# Patient Record
Sex: Female | Born: 1945 | Race: White | Hispanic: No | Marital: Married | Smoking: Never smoker
Health system: Southern US, Community
[De-identification: ages and names within clinical notes are randomized; demographics above are authoritative.]

## PROBLEM LIST (undated history)

## (undated) DIAGNOSIS — H269 Unspecified cataract: Secondary | ICD-10-CM

## (undated) DIAGNOSIS — J45909 Unspecified asthma, uncomplicated: Secondary | ICD-10-CM

## (undated) DIAGNOSIS — K5792 Diverticulitis of intestine, part unspecified, without perforation or abscess without bleeding: Secondary | ICD-10-CM

## (undated) DIAGNOSIS — J189 Pneumonia, unspecified organism: Secondary | ICD-10-CM

## (undated) DIAGNOSIS — Z87442 Personal history of urinary calculi: Secondary | ICD-10-CM

## (undated) DIAGNOSIS — Z789 Other specified health status: Secondary | ICD-10-CM

## (undated) DIAGNOSIS — E039 Hypothyroidism, unspecified: Secondary | ICD-10-CM

## (undated) DIAGNOSIS — D649 Anemia, unspecified: Secondary | ICD-10-CM

## (undated) DIAGNOSIS — J309 Allergic rhinitis, unspecified: Secondary | ICD-10-CM

## (undated) DIAGNOSIS — R519 Headache, unspecified: Secondary | ICD-10-CM

## (undated) DIAGNOSIS — K529 Noninfective gastroenteritis and colitis, unspecified: Secondary | ICD-10-CM

## (undated) DIAGNOSIS — I471 Supraventricular tachycardia, unspecified: Secondary | ICD-10-CM

## (undated) DIAGNOSIS — E7800 Pure hypercholesterolemia, unspecified: Secondary | ICD-10-CM

## (undated) DIAGNOSIS — K219 Gastro-esophageal reflux disease without esophagitis: Secondary | ICD-10-CM

## (undated) DIAGNOSIS — I517 Cardiomegaly: Secondary | ICD-10-CM

## (undated) DIAGNOSIS — R51 Headache: Secondary | ICD-10-CM

## (undated) DIAGNOSIS — M1990 Unspecified osteoarthritis, unspecified site: Secondary | ICD-10-CM

## (undated) DIAGNOSIS — M549 Dorsalgia, unspecified: Secondary | ICD-10-CM

## (undated) DIAGNOSIS — I071 Rheumatic tricuspid insufficiency: Secondary | ICD-10-CM

## (undated) HISTORY — DX: Supraventricular tachycardia: I47.1

## (undated) HISTORY — DX: Headache: R51

## (undated) HISTORY — DX: Hypothyroidism, unspecified: E03.9

## (undated) HISTORY — DX: Other specified health status: Z78.9

## (undated) HISTORY — DX: Supraventricular tachycardia, unspecified: I47.10

## (undated) HISTORY — PX: COLONOSCOPY: SHX174

## (undated) HISTORY — PX: BACK SURGERY: SHX140

## (undated) HISTORY — DX: Allergic rhinitis, unspecified: J30.9

## (undated) HISTORY — PX: BREAST EXCISIONAL BIOPSY: SUR124

## (undated) HISTORY — DX: Diverticulitis of intestine, part unspecified, without perforation or abscess without bleeding: K57.92

## (undated) HISTORY — DX: Gastro-esophageal reflux disease without esophagitis: K21.9

## (undated) HISTORY — DX: Cardiomegaly: I51.7

## (undated) HISTORY — PX: CATARACT EXTRACTION W/ INTRAOCULAR LENS IMPLANT: SHX1309

## (undated) HISTORY — PX: EYE SURGERY: SHX253

## (undated) HISTORY — DX: Dorsalgia, unspecified: M54.9

## (undated) HISTORY — DX: Rheumatic tricuspid insufficiency: I07.1

## (undated) HISTORY — DX: Headache, unspecified: R51.9

## (undated) HISTORY — DX: Unspecified asthma, uncomplicated: J45.909

## (undated) HISTORY — DX: Pure hypercholesterolemia, unspecified: E78.00

## (undated) HISTORY — DX: Noninfective gastroenteritis and colitis, unspecified: K52.9

## (undated) HISTORY — PX: INNER EAR SURGERY: SHX679

---
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
## 1980-03-24 HISTORY — PX: BREAST BIOPSY: SHX20

## 1981-03-24 HISTORY — PX: ABDOMINAL HYSTERECTOMY: SHX81

## 1997-08-08 ENCOUNTER — Other Ambulatory Visit: Admission: RE | Admit: 1997-08-08 | Discharge: 1997-08-08 | Payer: Self-pay | Admitting: Obstetrics and Gynecology

## 1997-12-28 ENCOUNTER — Ambulatory Visit (HOSPITAL_COMMUNITY): Admission: RE | Admit: 1997-12-28 | Discharge: 1997-12-28 | Payer: Self-pay | Admitting: Family Medicine

## 1997-12-28 ENCOUNTER — Encounter: Payer: Self-pay | Admitting: Family Medicine

## 1997-12-31 ENCOUNTER — Encounter: Payer: Self-pay | Admitting: Emergency Medicine

## 1997-12-31 ENCOUNTER — Emergency Department (HOSPITAL_COMMUNITY): Admission: EM | Admit: 1997-12-31 | Discharge: 1997-12-31 | Payer: Self-pay | Admitting: Emergency Medicine

## 1998-01-05 ENCOUNTER — Encounter: Payer: Self-pay | Admitting: Pulmonary Disease

## 1998-01-05 ENCOUNTER — Ambulatory Visit (HOSPITAL_COMMUNITY): Admission: RE | Admit: 1998-01-05 | Discharge: 1998-01-05 | Payer: Self-pay | Admitting: Pulmonary Disease

## 1998-01-08 ENCOUNTER — Ambulatory Visit (HOSPITAL_COMMUNITY): Admission: RE | Admit: 1998-01-08 | Discharge: 1998-01-08 | Payer: Self-pay | Admitting: Pulmonary Disease

## 1998-01-30 ENCOUNTER — Ambulatory Visit: Admission: RE | Admit: 1998-01-30 | Discharge: 1998-01-30 | Payer: Self-pay | Admitting: Pulmonary Disease

## 1998-08-07 ENCOUNTER — Encounter: Payer: Self-pay | Admitting: Pulmonary Disease

## 1998-08-07 ENCOUNTER — Ambulatory Visit (HOSPITAL_COMMUNITY): Admission: RE | Admit: 1998-08-07 | Discharge: 1998-08-07 | Payer: Self-pay | Admitting: Pulmonary Disease

## 2000-02-05 ENCOUNTER — Other Ambulatory Visit: Admission: RE | Admit: 2000-02-05 | Discharge: 2000-02-05 | Payer: Self-pay | Admitting: Obstetrics and Gynecology

## 2001-02-24 ENCOUNTER — Encounter: Payer: Self-pay | Admitting: Obstetrics and Gynecology

## 2001-02-24 ENCOUNTER — Encounter: Admission: RE | Admit: 2001-02-24 | Discharge: 2001-02-24 | Payer: Self-pay | Admitting: Obstetrics and Gynecology

## 2002-06-08 ENCOUNTER — Other Ambulatory Visit: Admission: RE | Admit: 2002-06-08 | Discharge: 2002-06-08 | Payer: Self-pay | Admitting: Gynecology

## 2002-06-17 ENCOUNTER — Encounter: Admission: RE | Admit: 2002-06-17 | Discharge: 2002-06-17 | Payer: Self-pay | Admitting: Gynecology

## 2002-06-17 ENCOUNTER — Encounter: Payer: Self-pay | Admitting: Gynecology

## 2003-08-07 ENCOUNTER — Other Ambulatory Visit: Admission: RE | Admit: 2003-08-07 | Discharge: 2003-08-07 | Payer: Self-pay | Admitting: Gynecology

## 2003-09-19 ENCOUNTER — Encounter: Admission: RE | Admit: 2003-09-19 | Discharge: 2003-09-19 | Payer: Self-pay | Admitting: General Surgery

## 2003-11-23 ENCOUNTER — Ambulatory Visit (HOSPITAL_COMMUNITY): Admission: RE | Admit: 2003-11-23 | Discharge: 2003-11-23 | Payer: Self-pay | Admitting: Gastroenterology

## 2004-05-14 ENCOUNTER — Ambulatory Visit (HOSPITAL_COMMUNITY): Admission: RE | Admit: 2004-05-14 | Discharge: 2004-05-14 | Payer: Self-pay | Admitting: Family Medicine

## 2004-08-07 ENCOUNTER — Other Ambulatory Visit: Admission: RE | Admit: 2004-08-07 | Discharge: 2004-08-07 | Payer: Self-pay | Admitting: Gynecology

## 2004-09-02 ENCOUNTER — Encounter: Admission: RE | Admit: 2004-09-02 | Discharge: 2004-09-02 | Payer: Self-pay | Admitting: Gynecology

## 2005-09-15 ENCOUNTER — Encounter: Admission: RE | Admit: 2005-09-15 | Discharge: 2005-09-15 | Payer: Self-pay | Admitting: General Surgery

## 2005-09-18 ENCOUNTER — Other Ambulatory Visit: Admission: RE | Admit: 2005-09-18 | Discharge: 2005-09-18 | Payer: Self-pay | Admitting: Gynecology

## 2005-10-06 ENCOUNTER — Encounter: Admission: RE | Admit: 2005-10-06 | Discharge: 2005-10-06 | Payer: Self-pay | Admitting: Cardiology

## 2006-09-22 ENCOUNTER — Other Ambulatory Visit: Admission: RE | Admit: 2006-09-22 | Discharge: 2006-09-22 | Payer: Self-pay | Admitting: Gynecology

## 2006-09-29 ENCOUNTER — Encounter: Admission: RE | Admit: 2006-09-29 | Discharge: 2006-09-29 | Payer: Self-pay | Admitting: Gynecology

## 2007-11-16 ENCOUNTER — Encounter: Admission: RE | Admit: 2007-11-16 | Discharge: 2007-11-16 | Payer: Self-pay | Admitting: Gynecology

## 2007-11-19 ENCOUNTER — Encounter: Admission: RE | Admit: 2007-11-19 | Discharge: 2007-11-19 | Payer: Self-pay | Admitting: Gynecology

## 2008-07-31 ENCOUNTER — Encounter: Admission: RE | Admit: 2008-07-31 | Discharge: 2008-07-31 | Payer: Self-pay | Admitting: Family Medicine

## 2008-10-13 ENCOUNTER — Encounter: Admission: RE | Admit: 2008-10-13 | Discharge: 2008-10-13 | Payer: Self-pay | Admitting: Family Medicine

## 2009-01-02 ENCOUNTER — Encounter: Admission: RE | Admit: 2009-01-02 | Discharge: 2009-01-02 | Payer: Self-pay | Admitting: Gynecology

## 2010-01-03 ENCOUNTER — Encounter: Admission: RE | Admit: 2010-01-03 | Discharge: 2010-01-03 | Payer: Self-pay | Admitting: Gynecology

## 2010-05-28 ENCOUNTER — Encounter: Payer: Self-pay | Admitting: Cardiology

## 2010-05-28 DIAGNOSIS — M549 Dorsalgia, unspecified: Secondary | ICD-10-CM | POA: Insufficient documentation

## 2010-05-28 DIAGNOSIS — K529 Noninfective gastroenteritis and colitis, unspecified: Secondary | ICD-10-CM | POA: Insufficient documentation

## 2010-05-28 DIAGNOSIS — E039 Hypothyroidism, unspecified: Secondary | ICD-10-CM | POA: Insufficient documentation

## 2010-05-28 DIAGNOSIS — E7800 Pure hypercholesterolemia, unspecified: Secondary | ICD-10-CM | POA: Insufficient documentation

## 2010-06-24 ENCOUNTER — Encounter: Payer: Self-pay | Admitting: Cardiology

## 2010-06-24 ENCOUNTER — Ambulatory Visit (INDEPENDENT_AMBULATORY_CARE_PROVIDER_SITE_OTHER): Payer: 59 | Admitting: Cardiology

## 2010-06-24 DIAGNOSIS — E039 Hypothyroidism, unspecified: Secondary | ICD-10-CM

## 2010-06-24 DIAGNOSIS — R002 Palpitations: Secondary | ICD-10-CM

## 2010-06-24 DIAGNOSIS — E7800 Pure hypercholesterolemia, unspecified: Secondary | ICD-10-CM

## 2010-06-24 LAB — COMPREHENSIVE METABOLIC PANEL
Albumin: 3.8 g/dL (ref 3.5–5.2)
Alkaline Phosphatase: 34 U/L — ABNORMAL LOW (ref 39–117)
Chloride: 99 mEq/L (ref 96–112)
Potassium: 4.2 mEq/L (ref 3.5–5.1)
Total Bilirubin: 0.8 mg/dL (ref 0.3–1.2)

## 2010-06-24 LAB — LIPID PANEL
HDL: 94.2 mg/dL (ref >39.00)
Total CHOL/HDL Ratio: 3
VLDL: 11 mg/dL (ref 0.0–40.0)

## 2010-06-24 NOTE — Assessment & Plan Note (Signed)
The patient recently went on a Trip to [place].  She has gained a few pounds since last visit.  She is trying to watch her diet carefully.  She is intolerant to statins and Zetia but is able to take Fenofibrate.

## 2010-06-24 NOTE — Assessment & Plan Note (Signed)
No symptoms of hyper or hypothyroidism.  Her thyroid medication is followed by her primary care physician.

## 2010-06-24 NOTE — Progress Notes (Signed)
HPI: This pleasant 65 year old Caucasian female is seen for a six-month followup office visit.  She has a history of palpitations as well as a history of hypercholesterolemia and hypothyroidism.  She is also had a past history of headaches and of low back pain.  Since last visit she's been feeling well.  She has not been expressing any chest pain or shortness of breath.  She did have one episode of tachycardia since last visit.  Current Outpatient Prescriptions  Medication Sig Dispense Refill  . aspirin 81 MG tablet Take 81 mg by mouth as needed.        . Calcium Carbonate-Vitamin D (CALCIUM 600 + D PO) Take by mouth as needed.        . Cholecalciferol (VITAMIN D PO) Take by mouth daily.        . fenofibrate 160 MG tablet Take 160 mg by mouth daily. Pt. Only taking one 3 or 4 times a week      . levothyroxine (SYNTHROID, LEVOTHROID) 75 MCG tablet Take 75 mcg by mouth daily.        . metoprolol succinate (TOPROL-XL) 25 MG 24 hr tablet Take 25 mg by mouth daily. 1/2 daily          Allergies  Allergen Reactions  . Crestor (Rosuvastatin Calcium)     Gi issues  . Lipitor (Atorvastatin Calcium)     Gi symp  . Pravachol     Gi issues  . Zetia (Ezetimibe)     Gi issues  . Zocor (Simvastatin - High Dose)     Gi issues    Patient Active Problem List  Diagnoses  . Heart palpitations  . Hypercholesterolemia  . History of underactive thyroid  . Back pain  . Generalized headaches  . Colitis  . Hypothyroidism    History  Smoking status  . Never Smoker   Smokeless tobacco  . Not on file    History  Alcohol Use: Not on file    No family history on file.  Review of Systems: The patient denies any heat or cold intolerance.  No weight gain or weight loss.  The patient denies headaches or blurry vision.  There is no cough or sputum production.  The patient denies dizziness.  There is no hematuria or hematochezia.  The patient denies any muscle aches or arthritis.  The patient denies  any rash.  The patient denies frequent falling or instability.  There is no history of depression or anxiety.  All other systems were reviewed and are negative.   Physical Exam: Filed Vitals:   06/24/10 0909  BP: 110/70  Pulse: 68  Her weight is 131, up 4 pounds.  The general appearance reveals a well-developed well-nourished woman in no distress.Pupils equal and reactive.   Extraocular Movements are full.  There is no scleral icterus.  The mouth and pharynx are normal.  The neck is supple.  The carotids reveal no bruits.  The jugular venous pressure is normal.  The thyroid is not enlarged.  There is no lymphadenopathy.The chest is clear to percussion and auscultation. There are no rales or rhonchi. Expansion of the chest is symmetrical.The precordium is quiet.  The first heart sound is normal.  The second heart sound is physiologically split.  There is no murmur gallop rub or click.  There is no abnormal lift or heave.The abdomen is soft and nontender. Bowel sounds are normal. The liver and spleen are not enlarged. There Are no abdominal masses. There are  no bruits.The pedal pulses are good.  There is no phlebitis or edema.  There is no cyanosis or clubbing.Strength is normal and symmetrical in all extremities.  There is no lateralizing weakness.  There are no sensory deficits.The skin is warm and dry.  There is no rash.    Assessment / Plan: Watch diet carefully and try to lose weight.  Recheck in 6 months.  Continue same medication

## 2010-06-24 NOTE — Assessment & Plan Note (Signed)
Since last visit she had one episode of tachycardia which lasted about 45 minutes and responded to [place] extra half tablet of Toprol.

## 2010-06-24 NOTE — Progress Notes (Signed)
Addended by: Carman Ching on: 06/24/2010 12:00 PM   Modules accepted: Orders

## 2010-06-26 ENCOUNTER — Telehealth: Payer: Self-pay | Admitting: *Deleted

## 2010-06-27 NOTE — Telephone Encounter (Signed)
Error, opened by mistake

## 2010-07-26 ENCOUNTER — Other Ambulatory Visit: Payer: Self-pay | Admitting: Cardiology

## 2010-07-26 NOTE — Telephone Encounter (Signed)
Medication Refill

## 2010-08-05 ENCOUNTER — Encounter: Payer: Self-pay | Admitting: Cardiology

## 2010-11-21 ENCOUNTER — Other Ambulatory Visit: Payer: Self-pay | Admitting: Cardiology

## 2010-11-21 DIAGNOSIS — E781 Pure hyperglyceridemia: Secondary | ICD-10-CM

## 2010-12-03 ENCOUNTER — Other Ambulatory Visit: Payer: Self-pay | Admitting: Gynecology

## 2010-12-03 DIAGNOSIS — Z1231 Encounter for screening mammogram for malignant neoplasm of breast: Secondary | ICD-10-CM

## 2011-01-09 ENCOUNTER — Ambulatory Visit
Admission: RE | Admit: 2011-01-09 | Discharge: 2011-01-09 | Disposition: A | Discharge status: Home / Self Care | Payer: 59 | Source: Ambulatory Visit | Attending: Gynecology | Admitting: Gynecology

## 2011-01-09 DIAGNOSIS — Z1231 Encounter for screening mammogram for malignant neoplasm of breast: Secondary | ICD-10-CM

## 2011-01-27 ENCOUNTER — Ambulatory Visit (INDEPENDENT_AMBULATORY_CARE_PROVIDER_SITE_OTHER): Payer: 59 | Admitting: *Deleted

## 2011-01-27 DIAGNOSIS — Z862 Personal history of diseases of the blood and blood-forming organs and certain disorders involving the immune mechanism: Secondary | ICD-10-CM

## 2011-01-27 DIAGNOSIS — Z8639 Personal history of other endocrine, nutritional and metabolic disease: Secondary | ICD-10-CM

## 2011-01-27 DIAGNOSIS — E7800 Pure hypercholesterolemia, unspecified: Secondary | ICD-10-CM

## 2011-01-27 DIAGNOSIS — R002 Palpitations: Secondary | ICD-10-CM

## 2011-01-27 LAB — LIPID PANEL
Cholesterol: 196 mg/dL (ref 0–200)
HDL: 97.1 mg/dL (ref >39.00)
Triglycerides: 57 mg/dL (ref 0.0–149.0)
VLDL: 11.4 mg/dL (ref 0.0–40.0)

## 2011-01-27 LAB — HEPATIC FUNCTION PANEL
ALT: 16 U/L (ref 0–35)
Albumin: 4.1 g/dL (ref 3.5–5.2)
Total Protein: 7.2 g/dL (ref 6.0–8.3)

## 2011-01-27 LAB — BASIC METABOLIC PANEL
BUN: 18 mg/dL (ref 6–23)
Chloride: 103 mEq/L (ref 96–112)
GFR: 81.1 mL/min (ref >60.00)
Potassium: 3.9 mEq/L (ref 3.5–5.1)
Sodium: 140 mEq/L (ref 135–145)

## 2011-01-29 ENCOUNTER — Encounter: Payer: Self-pay | Admitting: Cardiology

## 2011-01-29 ENCOUNTER — Ambulatory Visit (INDEPENDENT_AMBULATORY_CARE_PROVIDER_SITE_OTHER): Payer: Medicare Other | Admitting: Cardiology

## 2011-01-29 VITALS — BP 116/78 | HR 80 | Ht 62.0 in | Wt 133.0 lb

## 2011-01-29 DIAGNOSIS — E7800 Pure hypercholesterolemia, unspecified: Secondary | ICD-10-CM

## 2011-01-29 DIAGNOSIS — E039 Hypothyroidism, unspecified: Secondary | ICD-10-CM

## 2011-01-29 DIAGNOSIS — R002 Palpitations: Secondary | ICD-10-CM

## 2011-01-29 NOTE — Assessment & Plan Note (Signed)
The patient has had a past history of palpitations.  Recently she's had no chest pain or palpitations.  She has been getting more regular exercise and is using an exercise bicycle inside and also has a bicycle that she uses outside with her grandchildren in the warmer weather

## 2011-01-29 NOTE — Patient Instructions (Signed)
Your physician recommends that you continue on your current medications as directed. Please refer to the Current Medication list given to you today. Your physician wants you to follow-up in: 6 months with fasting labs (LP/HFP/BMET)  You will receive a reminder letter in the mail two months in advance. If you don't receive a letter, please call our office to schedule the follow-up appointment.

## 2011-01-29 NOTE — Assessment & Plan Note (Signed)
Patient has a past history of hypercholesterolemia.  She had recent lab work which shows excellent numbers.  He is intolerant of statins but is presently on fenofibrate which she is tolerating

## 2011-01-29 NOTE — Progress Notes (Signed)
Elizabeth Mills Date of Birth:  11-25-1945 [facility] / [facility] [address]   [address] [place], [place]  [address] [phone]           Fax   [phone]  HPI: This pleasant 65 year old woman is seen for a scheduled 6 month followup office visit.  She has a past history of hypercholesterolemia and hypothyroidism as well as palpitations.  He has had a tendency toward low blood pressure in the past.  He does not have any history of ischemic heart disease.  She had a normal nuclear stress test in 2007.  She has a past history of intermittent edema and had an echocardiogram in December 2008 showing impaired relaxation normal LV systolic function with an ejection fraction of 60-65%.  There was mild to moderate LVH.  There was mild her valve sclerosis without stenosis and she had normal pulmonary artery pressure.  Current Outpatient Prescriptions  Medication Sig Dispense Refill  . albuterol (PROVENTIL HFA;VENTOLIN HFA) 108 (90 BASE) MCG/ACT inhaler Inhale 2 puffs into the lungs every 6 (six) hours as needed.        Marland Kitchen aspirin 81 MG tablet Take 81 mg by mouth as needed.        . Calcium Carbonate-Vitamin D (CALCIUM 600 + D PO) Take by mouth as needed.        . fenofibrate 160 MG tablet TAKE 1 TABLET DAILY  90 tablet  3  . Fluticasone-Salmeterol (ADVAIR DISKUS) 250-50 MCG/DOSE AEPB Inhale 1 puff into the lungs every 12 (twelve) hours.        Marland Kitchen levothyroxine (SYNTHROID, LEVOTHROID) 75 MCG tablet Take 75 mcg by mouth daily.        . metoprolol succinate (TOPROL-XL) 25 MG 24 hr tablet TAKE ONE-HALF (1/2) TABLET DAILY OR AS DIRECTED  90 tablet  11    Allergies  Allergen Reactions  . Crestor (Rosuvastatin Calcium)     Gi issues  . Lipitor (Atorvastatin Calcium)     Gi symp  . Phenergan   . Pravachol     Gi issues  . Sulfa Antibiotics   . Zetia (Ezetimibe)     Gi issues  . Zocor (Simvastatin - High Dose)     Gi issues    Patient Active Problem List  Diagnoses  .  Heart palpitations  . Hypercholesterolemia  . History of underactive thyroid  . Back pain  . Generalized headaches  . Colitis  . Hypothyroidism    History  Smoking status  . Never Smoker   Smokeless tobacco  . Not on file    History  Alcohol Use: Not on file    No family history on file.  Review of Systems: The patient denies any heat or cold intolerance.  No weight gain or weight loss.  The patient denies headaches or blurry vision.  There is no cough or sputum production.  The patient denies dizziness.  There is no hematuria or hematochezia.  The patient denies any muscle aches or arthritis.  The patient denies any rash.  The patient denies frequent falling or instability.  There is no history of depression or anxiety.  All other systems were reviewed and are negative.   Physical Exam: Filed Vitals:   01/29/11 1334  BP: 116/78  Pulse: 80   Gen. appearance reveals a well-developed well-nourished woman in no distress.The head and neck exam reveals pupils equal and reactive.  Extraocular movements are full.  There is no scleral icterus.  The mouth  and pharynx are normal.  The neck is supple.  The carotids reveal no bruits.  The jugular venous pressure is normal.  The  thyroid is not enlarged.  There is no lymphadenopathy.  The chest is clear to percussion and auscultation.  There are no rales or rhonchi.  Expansion of the chest is symmetrical.  The precordium is quiet.  The first heart sound is normal.  The second heart sound is physiologically split.  There is no murmur gallop rub or click.  There is no abnormal lift or heave.  The abdomen is soft and nontender.  The bowel sounds are normal.  The liver and spleen are not enlarged.  There are no abdominal masses.  There are no abdominal bruits.  Extremities reveal good pedal pulses.  There is no phlebitis or edema.  There is no cyanosis or clubbing.  Strength is normal and symmetrical in all extremities.  There is no lateralizing  weakness.  There are no sensory deficits.  The skin is warm and dry.  There is no rash.     Assessment / Plan: Continue regular exercise.  Continue same medication.  Recheck in 6 months for followup office visit and fasting lab work

## 2011-07-22 ENCOUNTER — Other Ambulatory Visit (INDEPENDENT_AMBULATORY_CARE_PROVIDER_SITE_OTHER): Payer: Medicare Other

## 2011-07-22 DIAGNOSIS — E7800 Pure hypercholesterolemia, unspecified: Secondary | ICD-10-CM

## 2011-07-22 LAB — BASIC METABOLIC PANEL
Chloride: 104 mEq/L (ref 96–112)
GFR: 83.51 mL/min (ref >60.00)
Potassium: 3.8 mEq/L (ref 3.5–5.1)
Sodium: 140 mEq/L (ref 135–145)

## 2011-07-22 LAB — LIPID PANEL
HDL: 95.3 mg/dL (ref >39.00)
Triglycerides: 60 mg/dL (ref 0.0–149.0)
VLDL: 12 mg/dL (ref 0.0–40.0)

## 2011-07-22 LAB — HEPATIC FUNCTION PANEL
ALT: 16 U/L (ref 0–35)
Total Protein: 7.2 g/dL (ref 6.0–8.3)

## 2011-07-23 LAB — LDL CHOLESTEROL, DIRECT: Direct LDL: 103.2 mg/dL

## 2011-07-23 NOTE — Progress Notes (Signed)
Quick Note:  Please make copy of labs for patient visit. ______ 

## 2011-07-29 ENCOUNTER — Ambulatory Visit (INDEPENDENT_AMBULATORY_CARE_PROVIDER_SITE_OTHER): Payer: Medicare Other | Admitting: Cardiology

## 2011-07-29 ENCOUNTER — Encounter: Payer: Self-pay | Admitting: Cardiology

## 2011-07-29 VITALS — BP 101/63 | HR 70 | Ht 62.0 in | Wt 134.0 lb

## 2011-07-29 DIAGNOSIS — E7800 Pure hypercholesterolemia, unspecified: Secondary | ICD-10-CM

## 2011-07-29 DIAGNOSIS — R002 Palpitations: Secondary | ICD-10-CM

## 2011-07-29 DIAGNOSIS — Z8639 Personal history of other endocrine, nutritional and metabolic disease: Secondary | ICD-10-CM

## 2011-07-29 DIAGNOSIS — J40 Bronchitis, not specified as acute or chronic: Secondary | ICD-10-CM

## 2011-07-29 DIAGNOSIS — Z862 Personal history of diseases of the blood and blood-forming organs and certain disorders involving the immune mechanism: Secondary | ICD-10-CM

## 2011-07-29 MED ORDER — AMOXICILLIN 500 MG PO CAPS: 500.0000 mg | ORAL_CAPSULE | Freq: Three times a day (TID) | ORAL | Status: AC

## 2011-07-29 NOTE — Assessment & Plan Note (Signed)
The patient has a past history of hypercholesterolemia.  She is intolerant of statins.  He is presently on fenofibrate.  We reviewed her lab work which is satisfactory.

## 2011-07-29 NOTE — Patient Instructions (Signed)
Rx for Amoxil three times a day has been sent to [facility] Start plain Mucinex twice a day If no better or worse call back or go Urgent Care/Emergency Department  Continue all other medications  Your physician wants you to follow-up in: 1 year You will receive a reminder letter in the mail two months in advance. If you don't receive a letter, please call our office to schedule the follow-up appointment.

## 2011-07-29 NOTE — Assessment & Plan Note (Signed)
The patient has a past history of hypothyroidism and is on generic Synthroid 75 mcg daily.  She is clinically euthyroid

## 2011-07-29 NOTE — Assessment & Plan Note (Signed)
The patient has been experiencing a deep chest cough with green and yellow sputum.  She has already been taking some Mucinex.  We will add amoxicillin 500 mg 3 times a day.  She states that Zithromax usually does not help her.

## 2011-07-29 NOTE — Assessment & Plan Note (Signed)
The patient has not been experiencing any palpitations recently 

## 2011-07-29 NOTE — Progress Notes (Signed)
Elizabeth Mills Date of Birth:  03-10-1946 [facility] [address] [place], [place]  [address] [phone]         Fax   [phone]  History of Present Illness: This pleasant 66 year old woman is seen for a month followup office visit.  She has a history of hypercholesterolemia and a history of hypothyroidism.  She's had a past history of palpitations.  She does not have any history of ischemic heart disease and she had normal nuclear stress test in 2007.  She had an echocardiogram in December 2008 which showed an ejection fraction of 60-65% with impaired relaxation and normal LV systolic function.  Since last visit she's been doing well except for a nagging cough which has been present for several months but exacerbated recently by an upper respiratory infection.  Current Outpatient Prescriptions  Medication Sig Dispense Refill  . albuterol (PROVENTIL HFA;VENTOLIN HFA) 108 (90 BASE) MCG/ACT inhaler Inhale 2 puffs into the lungs every 6 (six) hours as needed.        Marland Kitchen aspirin 81 MG tablet Take 81 mg by mouth as needed.        . fenofibrate 160 MG tablet TAKE 1 TABLET DAILY  90 tablet  3  . Fluticasone-Salmeterol (ADVAIR DISKUS) 250-50 MCG/DOSE AEPB Inhale 1 puff into the lungs every 12 (twelve) hours.        Marland Kitchen levothyroxine (SYNTHROID, LEVOTHROID) 75 MCG tablet Take 75 mcg by mouth daily.        . metoprolol succinate (TOPROL-XL) 25 MG 24 hr tablet TAKE ONE-HALF (1/2) TABLET DAILY OR AS DIRECTED  90 tablet  11  . amoxicillin (AMOXIL) 500 MG capsule Take 1 capsule (500 mg total) by mouth 3 (three) times daily.  20 capsule  0    Allergies  Allergen Reactions  . Crestor (Rosuvastatin Calcium)     Gi issues  . Lipitor (Atorvastatin Calcium)     Gi symp  . Phenergan (Promethazine Hcl)   . Pravachol     Gi issues  . Promethazine Hcl   . Sulfa Antibiotics   . Zetia (Ezetimibe)     Gi issues  . Zocor (Simvastatin - High Dose)     Gi issues    Patient Active  Problem List  Diagnoses  . Heart palpitations  . Hypercholesterolemia  . History of underactive thyroid  . Back pain  . Generalized headaches  . Colitis  . Hypothyroidism  . Bronchitis    History  Smoking status  . Never Smoker   Smokeless tobacco  . Not on file    History  Alcohol Use: Not on file    No family history on file.  Review of Systems: Constitutional: no fever chills diaphoresis or fatigue or change in weight.  Head and neck: no hearing loss, no epistaxis, no photophobia or visual disturbance. Respiratory: No cough, shortness of breath or wheezing. Cardiovascular: No chest pain peripheral edema, palpitations. Gastrointestinal: No abdominal distention, no abdominal pain, no change in bowel habits hematochezia or melena. Genitourinary: No dysuria, no frequency, no urgency, no nocturia. Musculoskeletal:No arthralgias, no back pain, no gait disturbance or myalgias. Neurological: No dizziness, no headaches, no numbness, no seizures, no syncope, no weakness, no tremors. Hematologic: No lymphadenopathy, no easy bruising. Psychiatric: No confusion, no hallucinations, no sleep disturbance.    Physical Exam: Filed Vitals:   07/29/11 0944  BP: 101/63  Pulse: 70   the general appearance reveals a well-developed well-nourished woman in no distress.The head and neck exam  reveals pupils equal and reactive.  Extraocular movements are full.  There is no scleral icterus.  She does have some sinus tenderness in the maxillary and frontal sinuses.  The mouth and pharynx are normal.  The neck is supple.  The carotids reveal no bruits.  The jugular venous pressure is normal.  The  thyroid is not enlarged.  There is no lymphadenopathy.  The chest is clear to percussion and auscultation.  There are no rales or rhonchi.  Expansion of the chest is symmetrical.  The precordium is quiet.  The first heart sound is normal.  The second heart sound is physiologically split.  There is no murmur  gallop rub or click.  There is no abnormal lift or heave.  The abdomen is soft and nontender.  The bowel sounds are normal.  The liver and spleen are not enlarged.  There are no abdominal masses.  There are no abdominal bruits.  Extremities reveal good pedal pulses.  There is no phlebitis or edema.  There is no cyanosis or clubbing.  Strength is normal and symmetrical in all extremities.  There is no lateralizing weakness.  There are no sensory deficits.  The skin is warm and dry.  There is no rash.  EKG today shows normal sinus rhythm and no ischemic changes and is essentially within normal limits.  Assessment / Plan:  Continue same medication.  She will see Dr. Felicity Coyer for a new patient visit in September.  We will plan to see the patient back in one year for followup office visit EKG and fasting lipid panel hepatic function panel and basal metabolic panel. We will treat her bronchitis and sinusitis with amoxicillin today.

## 2011-10-24 ENCOUNTER — Other Ambulatory Visit: Payer: Self-pay | Admitting: Cardiology

## 2011-12-23 ENCOUNTER — Encounter: Payer: Self-pay | Admitting: Internal Medicine

## 2011-12-23 ENCOUNTER — Ambulatory Visit (INDEPENDENT_AMBULATORY_CARE_PROVIDER_SITE_OTHER): Payer: 59 | Admitting: Internal Medicine

## 2011-12-23 VITALS — BP 112/68 | HR 78 | Temp 97.6°F | Ht 62.5 in | Wt 134.0 lb

## 2011-12-23 DIAGNOSIS — Z23 Encounter for immunization: Secondary | ICD-10-CM

## 2011-12-23 DIAGNOSIS — J309 Allergic rhinitis, unspecified: Secondary | ICD-10-CM

## 2011-12-23 DIAGNOSIS — J45909 Unspecified asthma, uncomplicated: Secondary | ICD-10-CM | POA: Insufficient documentation

## 2011-12-23 DIAGNOSIS — E781 Pure hyperglyceridemia: Secondary | ICD-10-CM

## 2011-12-23 DIAGNOSIS — E039 Hypothyroidism, unspecified: Secondary | ICD-10-CM

## 2011-12-23 MED ORDER — CYCLOBENZAPRINE HCL 5 MG PO TABS: 5.0000 mg | ORAL_TABLET | Freq: Two times a day (BID) | ORAL | Status: DC | PRN

## 2011-12-23 MED ORDER — LORATADINE 10 MG PO TABS: 10.0000 mg | ORAL_TABLET | Freq: Every day | ORAL | Status: DC

## 2011-12-23 MED ORDER — FENOFIBRATE 160 MG PO TABS: 160.0000 mg | ORAL_TABLET | Freq: Every day | ORAL | Status: DC

## 2011-12-23 MED ORDER — ALBUTEROL SULFATE HFA 108 (90 BASE) MCG/ACT IN AERS: 2.0000 | INHALATION_SPRAY | RESPIRATORY_TRACT | Status: DC | PRN

## 2011-12-23 MED ORDER — METOPROLOL SUCCINATE ER 25 MG PO TB24: 12.5000 mg | ORAL_TABLET | Freq: Every day | ORAL | Status: DC

## 2011-12-23 NOTE — Assessment & Plan Note (Signed)
Continue over-the-counter Claritin once daily for next 30 days Suspect contributing to postnasal drip symptoms and throat clearing

## 2011-12-23 NOTE — Assessment & Plan Note (Signed)
Patient intolerant of prior statin trials Tolerating fenofibrate without complication Continue same with attention to exercise and diet as ongoing

## 2011-12-23 NOTE — Assessment & Plan Note (Signed)
No active symptoms or recent flares. Continue Advair with rescue inhaler as needed Send for prior records

## 2011-12-23 NOTE — Progress Notes (Signed)
Subjective:    Patient ID: Elizabeth Mills, female    DOB: 1945-12-19, 66 y.o.   MRN: [id number]  HPI  New patient to me and my division, known to cardiology -here to establish care Reviewed chronic medical issues today  Hypothyroid. the patient reports compliance with medication(s) as prescribed. Denies adverse side effects.  Dyslipidemia -intolerant of statin medications. Takes fenofibrate for same and follows with cardiology. Follows low-fat diet and regular exercise  Low back pain chronic with history of prior surgical intervention. Also upper back and neck spasms. No radiation into arms or hand. Associated with headache symptoms if spasms are severe. Episodic flares one or 2 times monthly. No regular use of muscle relaxants or pain medication  Past Medical History  Diagnosis Date  . Hypercholesterolemia   . Back pain   . Generalized headaches   . Colitis   . Hypothyroidism   . Asthma   . Diverticulitis   . Allergic rhinitis, cause unspecified   . GERD (gastroesophageal reflux disease)    Family History  Problem Relation Age of Onset  . Cancer Mother     Kidney and Ovarian Cancer  . Arthritis Mother   . Hyperlipidemia Mother   . Arthritis Father   . Hyperlipidemia Father    History  Substance Use Topics  . Smoking status: Never Smoker   . Smokeless tobacco: Never Used   Comment: married, lives with spouse - retired  . Alcohol Use: No    Review of Systems Constitutional: Negative for fever or unexpected weight change.  Respiratory: Negative for cough and shortness of breath.   complains of frequent throat clearing Cardiovascular: Negative for chest pain or palpitations.  Gastrointestinal: Negative for abdominal pain, no bowel changes.  reports difficulty swallowing bread, but no regurgitation Musculoskeletal: Negative for gait problem or joint swelling.  Skin: Negative for rash.  Neurological: Negative for dizziness or headache.  No other specific complaints in  a complete review of systems (except as listed in HPI above).     Objective:   Physical Exam BP 112/68  Pulse 78  Temp 97.6 F (36.4 C) (Oral)  Ht 5' 2.5" (1.588 m)  Wt 134 lb (60.782 kg)  BMI 24.12 kg/m2  SpO2 97% Wt Readings from Last 3 Encounters:  12/23/11 134 lb (60.782 kg)  07/29/11 134 lb (60.782 kg)  01/29/11 133 lb (60.328 kg)   Constitutional: She appears well-developed and well-nourished. No distress.  HENT: Head: Normocephalic and atraumatic. Ears: B TMs ok, no erythema or effusion; Nose: Nose normal. Mouth/Throat: Oropharynx is clear and moist. No oropharyngeal exudate.  thick postnasal drainage evident Eyes: Conjunctivae and EOM are normal. Pupils are equal, round, and reactive to light. No scleral icterus.  Neck: Normal range of motion. Neck supple. No JVD or LAD present. No carotid bruits. No thyromegaly present.  Cardiovascular: Normal rate, regular rhythm and normal heart sounds.  No murmur heard. No BLE edema. Pulmonary/Chest: Effort normal and breath sounds normal. No respiratory distress. She has no wheezes.  Abdominal: Soft. Bowel sounds are normal. She exhibits no distension. There is no tenderness. no masses Musculoskeletal: Normal range of motion, no joint effusions. No gross deformities Neurological: She is alert and oriented to person, place, and time. No cranial nerve deficit. Coordination normal.  Skin: Skin is warm and dry. No rash noted. No erythema.  Psychiatric: She has a normal mood and affect. Her behavior is normal. Judgment and thought content normal.   Lab Results  Component Value Date  GLUCOSE 88 07/22/2011   CHOL 207* 07/22/2011   TRIG 60.0 07/22/2011   HDL 95.30 07/22/2011   LDLDIRECT 103.2 07/22/2011   LDLCALC 88 01/27/2011   ALT 16 07/22/2011   AST 21 07/22/2011   NA 140 07/22/2011   K 3.8 07/22/2011   CL 104 07/22/2011   CREATININE 0.7 07/22/2011   BUN 16 07/22/2011   CO2 29 07/22/2011        Assessment & Plan:  See problem list.  Medications and labs reviewed today.  Postnasal drip with frequent throat clearing. Allergic sinusitis by history and exam. Also possible uncontrolled reflux. Recommended continuation of Nexium daily, not as needed for the next 30 days with daily antihistamine such as Claritin for the next 30 days. If continued problems, patient will call for referral to GI as needed to evaluate for possible esophageal problem  Time spent with pt today 45 minutes, greater than 50% time spent counseling patient on hypothyroid symptoms, lipid management, palpitations and postnasal drip symptoms. Also medication review and need to review prior records -release of information requested today

## 2011-12-23 NOTE — Assessment & Plan Note (Signed)
Will send for records from prior PCP Continue current dose thyroid, check TSH annually and adjust as needed No results found for this basename: TSH

## 2011-12-23 NOTE — Addendum Note (Signed)
Addended by: Carin Primrose on: 12/23/2011 04:49 PM   Modules accepted: Orders

## 2011-12-23 NOTE — Patient Instructions (Addendum)
It was good to see you today. We have reviewed your prior records including labs and tests today Medications reviewed and updated, Refill on medication(s) as discussed today. Take Nexium every day for the next 30 days in addition to Claritin everyday for the next 30 days -let us know if postnasal drip and throat clearing does not improve for referral as needed Try flexeril low dose at bedtime as needed for shoulder and neck spasm pain - Your prescription(s) have been submitted to your local pharmacy. Please take as directed and contact our office if you believe you are having problem(s) with the medication(s). Will send to your prior PCP for copy of past records Flu shot given today Please schedule followup in 6 months, call sooner if problems.

## 2012-01-29 ENCOUNTER — Other Ambulatory Visit: Payer: Self-pay | Admitting: Gynecology

## 2012-01-29 DIAGNOSIS — Z1231 Encounter for screening mammogram for malignant neoplasm of breast: Secondary | ICD-10-CM

## 2012-02-04 DIAGNOSIS — IMO0002 Reserved for concepts with insufficient information to code with codable children: Secondary | ICD-10-CM | POA: Insufficient documentation

## 2012-02-04 DIAGNOSIS — H53009 Unspecified amblyopia, unspecified eye: Secondary | ICD-10-CM | POA: Insufficient documentation

## 2012-03-01 ENCOUNTER — Other Ambulatory Visit: Payer: Self-pay | Admitting: *Deleted

## 2012-03-01 MED ORDER — FLUTICASONE-SALMETEROL 250-50 MCG/DOSE IN AEPB: 1.0000 | INHALATION_SPRAY | Freq: Two times a day (BID) | RESPIRATORY_TRACT | Status: DC

## 2012-03-01 MED ORDER — LEVOTHYROXINE SODIUM 75 MCG PO TABS: 75.0000 ug | ORAL_TABLET | Freq: Every day | ORAL | Status: DC

## 2012-03-01 MED ORDER — FLUTICASONE PROPIONATE 50 MCG/ACT NA SUSP: 2.0000 | Freq: Every day | NASAL | Status: DC

## 2012-03-01 NOTE — Telephone Encounter (Signed)
Left msg on vm needing [facility] flon[facility], levothyroxine, and advair. Called pt back no answer LMOM sent to mail service...[name]

## 2012-03-10 ENCOUNTER — Ambulatory Visit
Admission: RE | Admit: 2012-03-10 | Discharge: 2012-03-10 | Disposition: A | Discharge status: Home / Self Care | Payer: 59 | Source: Ambulatory Visit | Attending: Gynecology | Admitting: Gynecology

## 2012-03-10 DIAGNOSIS — Z1231 Encounter for screening mammogram for malignant neoplasm of breast: Secondary | ICD-10-CM

## 2012-03-31 ENCOUNTER — Ambulatory Visit (INDEPENDENT_AMBULATORY_CARE_PROVIDER_SITE_OTHER): Payer: 59 | Admitting: Internal Medicine

## 2012-03-31 ENCOUNTER — Encounter: Payer: Self-pay | Admitting: Internal Medicine

## 2012-03-31 ENCOUNTER — Ambulatory Visit (INDEPENDENT_AMBULATORY_CARE_PROVIDER_SITE_OTHER)
Admission: RE | Admit: 2012-03-31 | Discharge: 2012-03-31 | Disposition: A | Discharge status: Home / Self Care | Payer: 59 | Source: Ambulatory Visit | Attending: Internal Medicine | Admitting: Internal Medicine

## 2012-03-31 ENCOUNTER — Other Ambulatory Visit (INDEPENDENT_AMBULATORY_CARE_PROVIDER_SITE_OTHER): Payer: 59

## 2012-03-31 VITALS — BP 100/64 | HR 78 | Temp 98.8°F | Wt 135.8 lb

## 2012-03-31 DIAGNOSIS — M7541 Impingement syndrome of right shoulder: Secondary | ICD-10-CM

## 2012-03-31 DIAGNOSIS — E039 Hypothyroidism, unspecified: Secondary | ICD-10-CM

## 2012-03-31 MED ORDER — DICLOFENAC SODIUM 75 MG PO TBEC: 75.0000 mg | DELAYED_RELEASE_TABLET | Freq: Two times a day (BID) | ORAL | Status: DC

## 2012-03-31 NOTE — Assessment & Plan Note (Signed)
?  systemic symptoms related to undertx check TSH and adjust as needed No results found for this basename: TSH

## 2012-03-31 NOTE — Progress Notes (Signed)
  Subjective:    Patient ID: Elizabeth Mills, female    DOB: 1945-10-02, 67 y.o.   MRN: [id number]  HPI  complains of right shoulder pain Onset 2 weeks ago No radiation of pain into forearm, neck acknowledges overuse - moving furniture, but denies specific precipitating injury Hx L frozen shoulder similar to current symptoms - resolved with PT Not associated with weakness, no numbness symptoms not improved with prn OTC ibuprofen or Aleve  Also concerned ?thyroid off balance - bowel changes, dry skin, weight gain and hair loss  Past Medical History  Diagnosis Date  . Hypercholesterolemia   . Back pain   . Generalized headaches   . Colitis   . Hypothyroidism   . Asthma   . Diverticulitis   . Allergic rhinitis, cause unspecified   . GERD (gastroesophageal reflux disease)     Review of Systems  Constitutional: Negative for fever and fatigue.  HENT: Negative for neck pain and neck stiffness.   Neurological: Negative for weakness and headaches.       Objective:   Physical Exam BP 100/64  Pulse 78  Temp 98.8 F (37.1 C) (Oral)  Wt 135 lb 12.8 oz (61.598 kg)  SpO2 96% Wt Readings from Last 3 Encounters:  03/31/12 135 lb 12.8 oz (61.598 kg)  12/23/11 134 lb (60.782 kg)  07/29/11 134 lb (60.782 kg)   Constitutional: She appears well-developed and well-nourished. No distress.  Neck: Normal range of motion. Neck supple. No JVD present. No thyromegaly present.  Cardiovascular: Normal rate, regular rhythm and normal heart sounds.  No murmur heard. No BLE edema. Pulmonary/Chest: Effort normal and breath sounds normal. No respiratory distress. She has no wheezes.  Musculoskeletal: R Shoulder: Full range of motion. Neurovascularly intact distally. Good strength with stress of rotator cuff but causes pain. Positive impingement signs. Psychiatric: She has a normal mood and affect. Her behavior is normal. Judgment and thought content normal.   Lab Results  Component Value Date   GLUCOSE 88 07/22/2011   CHOL 207* 07/22/2011   TRIG 60.0 07/22/2011   HDL 95.30 07/22/2011   LDLDIRECT 103.2 07/22/2011   LDLCALC 88 01/27/2011   ALT 16 07/22/2011   AST 21 07/22/2011   NA 140 07/22/2011   K 3.8 07/22/2011   CL 104 07/22/2011   CREATININE 0.7 07/22/2011   BUN 16 07/22/2011   CO2 29 07/22/2011        Assessment & Plan:   R shoulder impingement - good strength on exam and no precipitating trauma so doubt tear declines steroid injection today - prior L shoulder symptoms resolved with PT Start oral NSAID course and refer to PT now Also check xray rule out DJD/spur To call if worse or unimproved on conservative care for ortho refer or imaging as needed  Also See problem list. Medications and labs reviewed today.

## 2012-03-31 NOTE — Patient Instructions (Signed)
It was good to see you today. Test(s) ordered today. Your results will be released to MyChart (or called to you) after review, usually within 72hours after test completion. If any changes need to be made, you will be notified at that same time. Take Voltaren 75mg  2x.day with food for 1-2 weeks, then Aleve as needed - do not take Aleve or ibuprofen while on prescription Voltaren Your prescription(s) have been submitted to your pharmacy. Please take as directed and contact our office if you believe you are having problem(s) with the medication(s). we'll make referral to physical therapy . Our office will contact you regarding appointment(s) once made. Impingement Syndrome, Rotator Cuff, Bursitis with Rehab Impingement syndrome is a condition that involves inflammation of the tendons of the rotator cuff and the subacromial bursa, that causes pain in the shoulder. The rotator cuff consists of four tendons and muscles that control much of the shoulder and upper arm function. The subacromial bursa is a fluid filled sac that helps reduce friction between the rotator cuff and one of the bones of the shoulder (acromion). Impingement syndrome is usually an overuse injury that causes swelling of the bursa (bursitis), swelling of the tendon (tendonitis), and/or a tear of the tendon (strain). Strains are classified into three categories. Grade 1 strains cause pain, but the tendon is not lengthened. Grade 2 strains include a lengthened ligament, due to the ligament being stretched or partially ruptured. With grade 2 strains there is still function, although the function may be decreased. Grade 3 strains include a complete tear of the tendon or muscle, and function is usually impaired. SYMPTOMS    Pain around the shoulder, often at the outer portion of the upper arm.   Pain that gets worse with shoulder function, especially when reaching overhead or lifting.   Sometimes, aching when not using the arm.   Pain that  wakes you up at night.   Sometimes, tenderness, swelling, warmth, or redness over the affected area.   Loss of strength.   Limited motion of the shoulder, especially reaching behind the back (to the back pocket or to unhook bra) or across your body.   Crackling sound (crepitation) when moving the arm.   Biceps tendon pain and inflammation (in the front of the shoulder). Worse when bending the elbow or lifting.  CAUSES   Impingement syndrome is often an overuse injury, in which chronic (repetitive) motions cause the tendons or bursa to become inflamed. A strain occurs when a force is paced on the tendon or muscle that is greater than it can withstand. Common mechanisms of injury include: Stress from sudden increase in duration, frequency, or intensity of training.  Direct hit (trauma) to the shoulder.   Aging, erosion of the tendon with normal use.   Bony bump on shoulder (acromial spur).  RISK INCREASES WITH:  Contact sports (football, wrestling, boxing).   Throwing sports (baseball, tennis, volleyball).   Weightlifting and bodybuilding.   Heavy labor.   Previous injury to the rotator cuff, including impingement.   Poor shoulder strength and flexibility.   Failure to warm up properly before activity.   Inadequate protective equipment.   Old age.   Bony bump on shoulder (acromial spur).  PREVENTION    Warm up and stretch properly before activity.   Allow for adequate recovery between workouts.   Maintain physical fitness:   Strength, flexibility, and endurance.   Cardiovascular fitness.   Learn and use proper exercise technique.  PROGNOSIS   If treated  properly, impingement syndrome usually goes away within 6 weeks. Sometimes surgery is required.   RELATED COMPLICATIONS    Longer healing time if not properly treated, or if not given enough time to heal.   Recurring symptoms, that result in a chronic condition.   Shoulder stiffness, frozen shoulder, or loss  of motion.   Rotator cuff tendon tear.   Recurring symptoms, especially if activity is resumed too soon, with overuse, with a direct blow, or when using poor technique.  TREATMENT   Treatment first involves the use of ice and medicine, to reduce pain and inflammation. The use of strengthening and stretching exercises may help reduce pain with activity. These exercises may be performed at home or with a therapist. If non-surgical treatment is unsuccessful after more than 6 months, surgery may be advised. After surgery and rehabilitation, activity is usually possible in 3 months.   MEDICATION  If pain medicine is needed, nonsteroidal anti-inflammatory medicines (aspirin and ibuprofen), or other minor pain relievers (acetaminophen), are often advised.   Do not take pain medicine for 7 days before surgery.   Prescription pain relievers may be given, if your caregiver thinks they are needed. Use only as directed and only as much as you need.   Corticosteroid injections may be given by your caregiver. These injections should be reserved for the most serious cases, because they may only be given a certain number of times.  HEAT AND COLD  Cold treatment (icing) should be applied for 10 to 15 minutes every 2 to 3 hours for inflammation and pain, and immediately after activity that aggravates your symptoms. Use ice packs or an ice massage.   Heat treatment may be used before performing stretching and strengthening activities prescribed by your caregiver, physical therapist, or athletic trainer. Use a heat pack or a warm water soak.  SEEK MEDICAL CARE IF:    Symptoms get worse or do not improve in 4 to 6 weeks, despite treatment.   New, unexplained symptoms develop. (Drugs used in treatment may produce side effects.)  EXERCISES   RANGE OF MOTION (ROM) AND STRETCHING EXERCISES - Impingement Syndrome (Rotator Cuff  Tendinitis, Bursitis) These exercises may help you when beginning to rehabilitate your  injury. Your symptoms may go away with or without further involvement from your physician, physical therapist or athletic trainer. While completing these exercises, remember:    Restoring tissue flexibility helps normal motion to return to the joints. This allows healthier, less painful movement and activity.   An effective stretch should be held for at least 30 seconds.   A stretch should never be painful. You should only feel a gentle lengthening or release in the stretched tissue.  STRETCH  Flexion, Standing  Stand with good posture. With an underhand grip on your right / left hand, and an overhand grip on the opposite hand, grasp a broomstick or cane so that your hands are a little more than shoulder width apart.   Keeping your right / left elbow straight and shoulder muscles relaxed, push the stick with your opposite hand, to raise your right / left arm in front of your body and then overhead. Raise your arm until you feel a stretch in your right / left shoulder, but before you have increased shoulder pain.   Try to avoid shrugging your right / left shoulder as your arm rises, by keeping your shoulder blade tucked down and toward your mid-back spine. Hold for __________ seconds.   Slowly return to the starting  position.  Repeat __________ times. Complete this exercise __________ times per day. STRETCH  Abduction, Supine  Lie on your back. With an underhand grip on your right / left hand and an overhand grip on the opposite hand, grasp a broomstick or cane so that your hands are a little more than shoulder width apart.   Keeping your right / left elbow straight and your shoulder muscles relaxed, push the stick with your opposite hand, to raise your right / left arm out to the side of your body and then overhead. Raise your arm until you feel a stretch in your right / left shoulder, but before you have increased shoulder pain.   Try to avoid shrugging your right / left shoulder as your arm  rises, by keeping your shoulder blade tucked down and toward your mid-back spine. Hold for __________ seconds.   Slowly return to the starting position.  Repeat __________ times. Complete this exercise __________ times per day. ROM  Flexion, Active-Assisted  Lie on your back. You may bend your knees for comfort.   Grasp a broomstick or cane so your hands are about shoulder width apart. Your right / left hand should grip the end of the stick, so that your hand is positioned "thumbs-up," as if you were about to shake hands.   Using your healthy arm to lead, raise your right / left arm overhead, until you feel a gentle stretch in your shoulder. Hold for __________ seconds.   Use the stick to assist in returning your right / left arm to its starting position.  Repeat __________ times. Complete this exercise __________ times per day.   ROM - Internal Rotation, Supine   Lie on your back on a firm surface. Place your right / left elbow about 60 degrees away from your side. Elevate your elbow with a folded towel, so that the elbow and shoulder are the same height.   Using a broomstick or cane and your strong arm, pull your right / left hand toward your body until you feel a gentle stretch, but no increase in your shoulder pain. Keep your shoulder and elbow in place throughout the exercise.   Hold for __________ seconds. Slowly return to the starting position.  Repeat __________ times. Complete this exercise __________ times per day. STRETCH - Internal Rotation  Place your right / left hand behind your back, palm up.   Throw a towel or belt over your opposite shoulder. Grasp the towel with your right / left hand.   While keeping an upright posture, gently pull up on the towel, until you feel a stretch in the front of your right / left shoulder.   Avoid shrugging your right / left shoulder as your arm rises, by keeping your shoulder blade tucked down and toward your mid-back spine.   Hold for  __________ seconds. Release the stretch, by lowering your healthy hand.  Repeat __________ times. Complete this exercise __________ times per day. ROM - Internal Rotation   Using an underhand grip, grasp a stick behind your back with both hands.   While standing upright with good posture, slide the stick up your back until you feel a mild stretch in the front of your shoulder.   Hold for __________ seconds. Slowly return to your starting position.  Repeat __________ times. Complete this exercise __________ times per day.   STRETCH  Posterior Shoulder Capsule   Stand or sit with good posture. Grasp your right / left elbow and draw it  across your chest, keeping it at the same height as your shoulder.   Pull your elbow, so your upper arm comes in closer to your chest. Pull until you feel a gentle stretch in the back of your shoulder.   Hold for __________ seconds.  Repeat __________ times. Complete this exercise __________ times per day. STRENGTHENING EXERCISES - Impingement Syndrome (Rotator Cuff Tendinitis, Bursitis) These exercises may help you when beginning to rehabilitate your injury. They may resolve your symptoms with or without further involvement from your physician, physical therapist or athletic trainer. While completing these exercises, remember:  Muscles can gain both the endurance and the strength needed for everyday activities through controlled exercises.   Complete these exercises as instructed by your physician, physical therapist or athletic trainer. Increase the resistance and repetitions only as guided.   You may experience muscle soreness or fatigue, but the pain or discomfort you are trying to eliminate should never worsen during these exercises. If this pain does get worse, stop and make sure you are following the directions exactly. If the pain is still present after adjustments, discontinue the exercise until you can discuss the trouble with your clinician.   During  your recovery, avoid activity or exercises which involve actions that place your injured hand or elbow above your head or behind your back or head. These positions stress the tissues which you are trying to heal.  STRENGTH - Scapular Depression and Adduction   With good posture, sit on a firm chair. Support your arms in front of you, with pillows, arm rests, or on a table top. Have your elbows in line with the sides of your body.   Gently draw your shoulder blades down and toward your mid-back spine. Gradually increase the tension, without tensing the muscles along the top of your shoulders and the back of your neck.   Hold for __________ seconds. Slowly release the tension and relax your muscles completely before starting the next repetition.   After you have practiced this exercise, remove the arm support and complete the exercise in standing as well as sitting position.  Repeat __________ times. Complete this exercise __________ times per day.   STRENGTH - Shoulder Abductors, Isometric  With good posture, stand or sit about 4-6 inches from a wall, with your right / left side facing the wall.   Bend your right / left elbow. Gently press your right / left elbow into the wall. Increase the pressure gradually, until you are pressing as hard as you can, without shrugging your shoulder or increasing any shoulder discomfort.   Hold for __________ seconds.   Release the tension slowly. Relax your shoulder muscles completely before you begin the next repetition.  Repeat __________ times. Complete this exercise __________ times per day.   STRENGTH - External Rotators, Isometric  Keep your right / left elbow at your side and bend it 90 degrees.   Step into a door frame so that the outside of your right / left wrist can press against the door frame without your upper arm leaving your side.   Gently press your right / left wrist into the door frame, as if you were trying to swing the back of your  hand away from your stomach. Gradually increase the tension, until you are pressing as hard as you can, without shrugging your shoulder or increasing any shoulder discomfort.   Hold for __________ seconds.   Release the tension slowly. Relax your shoulder muscles completely before you begin the  next repetition.  Repeat __________ times. Complete this exercise __________ times per day.   STRENGTH - Supraspinatus   Stand or sit with good posture. Grasp a __________ weight, or an exercise band or tubing, so that your hand is "thumbs-up," like you are shaking hands.   Slowly lift your right / left arm in a "V" away from your thigh, diagonally into the space between your side and straight ahead. Lift your hand to shoulder height or as far as you can, without increasing any shoulder pain. At first, many people do not lift their hands above shoulder height.   Avoid shrugging your right / left shoulder as your arm rises, by keeping your shoulder blade tucked down and toward your mid-back spine.   Hold for __________ seconds. Control the descent of your hand, as you slowly return to your starting position.  Repeat __________ times. Complete this exercise __________ times per day.   STRENGTH - External Rotators  Secure a rubber exercise band or tubing to a fixed object (table, pole) so that it is at the same height as your right / left elbow when you are standing or sitting on a firm surface.   Stand or sit so that the secured exercise band is at your uninjured side.   Bend your right / left elbow 90 degrees. Place a folded towel or small pillow under your right / left arm, so that your elbow is a few inches away from your side.   Keeping the tension on the exercise band, pull it away from your body, as if pivoting on your elbow. Be sure to keep your body steady, so that the movement is coming only from your rotating shoulder.   Hold for __________ seconds. Release the tension in a controlled manner,  as you return to the starting position.  Repeat __________ times. Complete this exercise __________ times per day.   STRENGTH - Internal Rotators   Secure a rubber exercise band or tubing to a fixed object (table, pole) so that it is at the same height as your right / left elbow when you are standing or sitting on a firm surface.   Stand or sit so that the secured exercise band is at your right / left side.   Bend your elbow 90 degrees. Place a folded towel or small pillow under your right / left arm so that your elbow is a few inches away from your side.   Keeping the tension on the exercise band, pull it across your body, toward your stomach. Be sure to keep your body steady, so that the movement is coming only from your rotating shoulder.   Hold for __________ seconds. Release the tension in a controlled manner, as you return to the starting position.  Repeat __________ times. Complete this exercise __________ times per day.   STRENGTH - Scapular Protractors, Standing   Stand arms length away from a wall. Place your hands on the wall, keeping your elbows straight.   Begin by dropping your shoulder blades down and toward your mid-back spine.   To strengthen your protractors, keep your shoulder blades down, but slide them forward on your rib cage. It will feel as if you are lifting the back of your rib cage away from the wall. This is a subtle motion and can be challenging to complete. Ask your caregiver for further instruction, if you are not sure you are doing the exercise correctly.   Hold for __________ seconds. Slowly return to the  starting position, resting the muscles completely before starting the next repetition.  Repeat __________ times. Complete this exercise __________ times per day. STRENGTH - Scapular Protractors, Supine  Lie on your back on a firm surface. Extend your right / left arm straight into the air while holding a __________ weight in your hand.   Keeping your head  and back in place, lift your shoulder off the floor.   Hold for __________ seconds. Slowly return to the starting position, and allow your muscles to relax completely before starting the next repetition.  Repeat __________ times. Complete this exercise __________ times per day. STRENGTH - Scapular Protractors, Quadruped  Get onto your hands and knees, with your shoulders directly over your hands (or as close as you can be, comfortably).   Keeping your elbows locked, lift the back of your rib cage up into your shoulder blades, so your mid-back rounds out. Keep your neck muscles relaxed.   Hold this position for __________ seconds. Slowly return to the starting position and allow your muscles to relax completely before starting the next repetition.  Repeat __________ times. Complete this exercise __________ times per day.   STRENGTH - Scapular Retractors  Secure a rubber exercise band or tubing to a fixed object (table, pole), so that it is at the height of your shoulders when you are either standing, or sitting on a firm armless chair.   With a palm down grip, grasp an end of the band in each hand. Straighten your elbows and lift your hands straight in front of you, at shoulder height. Step back, away from the secured end of the band, until it becomes tense.   Squeezing your shoulder blades together, draw your elbows back toward your sides, as you bend them. Keep your upper arms lifted away from your body throughout the exercise.   Hold for __________ seconds. Slowly ease the tension on the band, as you reverse the directions and return to the starting position.  Repeat __________ times. Complete this exercise __________ times per day. STRENGTH - Shoulder Extensors   Secure a rubber exercise band or tubing to a fixed object (table, pole) so that it is at the height of your shoulders when you are either standing, or sitting on a firm armless chair.   With a thumbs-up grip, grasp an end of the  band in each hand. Straighten your elbows and lift your hands straight in front of you, at shoulder height. Step back, away from the secured end of the band, until it becomes tense.   Squeezing your shoulder blades together, pull your hands down to the sides of your thighs. Do not allow your hands to go behind you.   Hold for __________ seconds. Slowly ease the tension on the band, as you reverse the directions and return to the starting position.  Repeat __________ times. Complete this exercise __________ times per day.   STRENGTH - Scapular Retractors and External Rotators   Secure a rubber exercise band or tubing to a fixed object (table, pole) so that it is at the height as your shoulders, when you are either standing, or sitting on a firm armless chair.   With a palm down grip, grasp an end of the band in each hand. Bend your elbows 90 degrees and lift your elbows to shoulder height, at your sides. Step back, away from the secured end of the band, until it becomes tense.   Squeezing your shoulder blades together, rotate your shoulders so that your  upper arms and elbows remain stationary, but your fists travel upward to head height.   Hold for __________ seconds. Slowly ease the tension on the band, as you reverse the directions and return to the starting position.  Repeat __________ times. Complete this exercise __________ times per day.   STRENGTH - Scapular Retractors and External Rotators, Rowing   Secure a rubber exercise band or tubing to a fixed object (table, pole) so that it is at the height of your shoulders, when you are either standing, or sitting on a firm armless chair.   With a palm down grip, grasp an end of the band in each hand. Straighten your elbows and lift your hands straight in front of you, at shoulder height. Step back, away from the secured end of the band, until it becomes tense.   Step 1: Squeeze your shoulder blades together. Bending your elbows, draw your hands  to your chest, as if you are rowing a boat. At the end of this motion, your hands and elbow should be at shoulder height and your elbows should be out to your sides.   Step 2: Rotate your shoulders, to raise your hands above your head. Your forearms should be vertical and your upper arms should be horizontal.   Hold for __________ seconds. Slowly ease the tension on the band, as you reverse the directions and return to the starting position.  Repeat __________ times. Complete this exercise __________ times per day.   STRENGTH  Scapular Depressors  Find a sturdy chair without wheels, such as a dining room chair.   Keeping your feet on the floor, and your hands on the chair arms, lift your bottom up from the seat, and lock your elbows.   Keeping your elbows straight, allow gravity to pull your body weight down. Your shoulders will rise toward your ears.   Raise your body against gravity by drawing your shoulder blades down your back, shortening the distance between your shoulders and ears. Although your feet should always maintain contact with the floor, your feet should progressively support less body weight, as you get stronger.   Hold for __________ seconds. In a controlled and slow manner, lower your body weight to begin the next repetition.  Repeat __________ times. Complete this exercise __________ times per day.   Document Released: 03/10/2005 Document Revised: 06/02/2011 Document Reviewed: 06/22/2008 [facility] Patient Information 2013 [place], [place].

## 2012-04-01 LAB — TSH: TSH: 0.93 u[IU]/mL (ref 0.35–5.50)

## 2012-04-01 NOTE — Progress Notes (Addendum)
Subjective:    Patient ID: Elizabeth Mills, female    DOB: Sep 20, 1945, 67 y.o.   MRN: [id number]  CC: R shoulder pain HPI Pt presents with 2 weeks history of R shoulder pain in certain positions(arm extended and rotated as when backing out her car, lifting her arms above her head)  Pt does report some heavy lifting when moving furniture around the onset of the pain.  Pain is a 10/10 sharp stabbing pain in the joint that will shoot down the upper extremity when arm is in certain positions as noted above.  Site will then throb for several minutes before easing off.  Pt denies radiation to the neck as well as any numbness and tingling of the site or distal extremity.  Pt denies any fall or trauma to the R shoulder.  Pt reports still being able to use arm and has good strength of the joint when in use.  She has tried aleve and ibuprofen OTC sporadically with no improvement.  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    Pt also concerned with recent weight gain and some minor hair loss.  Denies other skin or nail changes. She is concerned for her thyroid levels being down.  Past Medical History  Diagnosis Date  . Hypercholesterolemia   . Back pain   . Generalized headaches   . Colitis   . Hypothyroidism   . Asthma   . Diverticulitis   . Allergic rhinitis, cause unspecified   . GERD (gastroesophageal reflux disease)     Review of Systems  Constitutional: Negative for fever and fatigue.  HENT: Negative for neck pain and neck stiffness.   Musculoskeletal: Negative for myalgias, back pain and joint swelling.      Objective:   Physical Exam  Constitutional: She appears well-developed and well-nourished. She is cooperative. No distress.  HENT:  Head: Normocephalic.  Neck: Normal range of motion and full passive range of motion without pain. Neck supple. No spinous process tenderness present. No thyromegaly present.  Cardiovascular: Normal rate, regular  rhythm and normal heart sounds.   Pulmonary/Chest: Effort normal and breath sounds normal. No respiratory distress.  Musculoskeletal:       Right shoulder: She exhibits tenderness and pain. She exhibits normal range of motion.       Left shoulder: Normal.       Negative drop arm test. Positive Apley Scratch test for the R shoulder.  Muscle strength intact for both shoulders.  Lymphadenopathy:    She has no cervical adenopathy.  Neurological: She is alert. She has normal strength.  Skin: Skin is warm and dry.  Psychiatric: She has a normal mood and affect.    BP 100/64  Pulse 78  Temp 98.8 F (37.1 C) (Oral)  Wt 135 lb 12.8 oz (61.598 kg)  SpO2 96%      Assessment & Plan:   R shoulder impingement- Pt referred for physical therapy to prevent frozen shoulder and maintain ROM of R shoulder.  Pt denied steroid injection at this time since previous frozen shoulder treated solely with physical therapy she would like to try that first.  Started Voltaren 75mg  1 tablet by mouth BID x 2 weeks scheduled then may take OTC Aleve for any remaining pain.  Shoulder x-ray ordered to check for any arthritic changes or possible bone spur as contributing cause. Pt may ice shoulder as needed. Pt advised to call if worsening or unresolved symptoms after conservative  therapy.   Hypothyroidism-  No baseline labs noted.  Will send pt to have TSH drawn today to check thyroid levels.  Informed that we will call with results and make any necessary adjustments to her medication at that time.    Rhea Pink PA-S

## 2012-06-02 ENCOUNTER — Encounter: Payer: Self-pay | Admitting: Cardiology

## 2012-06-10 ENCOUNTER — Other Ambulatory Visit: Payer: Self-pay | Admitting: Internal Medicine

## 2012-07-28 ENCOUNTER — Ambulatory Visit (INDEPENDENT_AMBULATORY_CARE_PROVIDER_SITE_OTHER): Payer: Medicare Other | Admitting: Cardiology

## 2012-07-28 ENCOUNTER — Encounter: Payer: Self-pay | Admitting: Cardiology

## 2012-07-28 VITALS — BP 112/56 | HR 76 | Ht 62.0 in | Wt 131.0 lb

## 2012-07-28 DIAGNOSIS — L659 Nonscarring hair loss, unspecified: Secondary | ICD-10-CM

## 2012-07-28 DIAGNOSIS — R002 Palpitations: Secondary | ICD-10-CM | POA: Insufficient documentation

## 2012-07-28 DIAGNOSIS — E7800 Pure hypercholesterolemia, unspecified: Secondary | ICD-10-CM

## 2012-07-28 LAB — HEPATIC FUNCTION PANEL
ALT: 19 U/L (ref 0–35)
Albumin: 4.1 g/dL (ref 3.5–5.2)
Bilirubin, Direct: 0 mg/dL (ref 0.0–0.3)
Total Protein: 7 g/dL (ref 6.0–8.3)

## 2012-07-28 LAB — BASIC METABOLIC PANEL
BUN: 20 mg/dL (ref 6–23)
Chloride: 103 mEq/L (ref 96–112)
GFR: 83.25 mL/min (ref >60.00)
Potassium: 4.4 mEq/L (ref 3.5–5.1)
Sodium: 139 mEq/L (ref 135–145)

## 2012-07-28 LAB — LIPID PANEL
Cholesterol: 208 mg/dL — ABNORMAL HIGH (ref 0–200)
HDL: 95 mg/dL (ref >39.00)
Triglycerides: 71 mg/dL (ref 0.0–149.0)
VLDL: 14.2 mg/dL (ref 0.0–40.0)

## 2012-07-28 LAB — LDL CHOLESTEROL, DIRECT: Direct LDL: 94.2 mg/dL

## 2012-07-28 NOTE — Progress Notes (Signed)
Elizabeth Mills Date of Birth:  [birth year] [facility] [address] [place], [place]  [address] [phone]         Fax   [phone]  History of Present Illness: This pleasant 67 year old woman is seen for a one-year followup office visit. She has a history of hypercholesterolemia and a history of hypothyroidism. She's had a past history of palpitations. She does not have any history of ischemic heart disease and she had normal nuclear stress test in 2007. She had an echocardiogram in December 2008 which showed an ejection fraction of 60-65% with impaired relaxation and normal LV systolic function.   Current Outpatient Prescriptions  Medication Sig Dispense Refill  . ADVAIR DISKUS 250-50 MCG/DOSE AEPB USE ONE INHALATION EVERY 12 HOURS  3 each  0  . albuterol (PROAIR HFA) 108 (90 BASE) MCG/ACT inhaler Inhale 2 puffs into the lungs every 4 (four) hours as needed for wheezing or shortness of breath.  3 Inhaler  3  . aspirin 81 MG tablet Take 81 mg by mouth as needed.        Marland Kitchen BIOTIN PO Take by mouth daily.      Marland Kitchen conjugated estrogens (PREMARIN) vaginal cream Place vaginally 3 (three) times a week.  42.5 g  12  . esomeprazole (NEXIUM) 40 MG capsule Take 1 capsule (40 mg total) by mouth daily before breakfast.      . fenofibrate 160 MG tablet Take 1 tablet (160 mg total) by mouth daily.  90 tablet  3  . fish oil-omega-3 fatty acids 1000 MG capsule Take 1 g by mouth daily.      . fluticasone (FLONASE) 50 MCG/ACT nasal spray Place 2 sprays into the nose daily.  48 g  0  . levothyroxine (SYNTHROID, LEVOTHROID) 75 MCG tablet Take 1 tablet (75 mcg total) by mouth daily.  90 tablet  3  . loratadine (CLARITIN) 10 MG tablet Take 1 tablet (10 mg total) by mouth daily.  30 tablet  11  . metoprolol succinate (TOPROL-XL) 25 MG 24 hr tablet Take 0.5 tablets (12.5 mg total) by mouth daily.  90 tablet  3  . Multiple Vitamins-Minerals (ADULT GUMMY) CHEW Chew 1 each by mouth 2 (two)  times daily after a meal.       . cyclobenzaprine (FLEXERIL) 5 MG tablet Take 1 tablet (5 mg total) by mouth 2 (two) times daily as needed for muscle spasms.  30 tablet  0  . diclofenac (VOLTAREN) 75 MG EC tablet Take 1 tablet (75 mg total) by mouth 2 (two) times daily with a meal. X 2 weeks  30 tablet  0   No current facility-administered medications for this visit.    Allergies  Allergen Reactions  . Crestor (Rosuvastatin Calcium)     Gi issues  . Lipitor (Atorvastatin Calcium)     Gi symp  . Phenergan (Promethazine Hcl)   . Pravachol     Gi issues  . Promethazine Hcl   . Sulfa Antibiotics   . Zetia (Ezetimibe)     Gi issues  . Zocor (Simvastatin - High Dose)     Gi issues    Patient Active Problem List   Diagnosis Date Noted  . Asthma   . Allergic rhinitis   . Bronchitis 07/29/2011  . Hypercholesterolemia   . Back pain   . Generalized headaches   . Colitis   . Hypothyroidism     History  Smoking status  . Never Smoker   Smokeless  tobacco  . Never Used    Comment: married, lives with spouse - retired    History  Alcohol Use No    Family History  Problem Relation Age of Onset  . Cancer Mother     Kidney and Ovarian Cancer  . Arthritis Mother   . Hyperlipidemia Mother   . Arthritis Father   . Hyperlipidemia Father     Review of Systems: Constitutional: no fever chills diaphoresis or fatigue or change in weight.  Head and neck: no hearing loss, no epistaxis, no photophobia or visual disturbance. Respiratory: No cough, shortness of breath or wheezing. Cardiovascular: No chest pain peripheral edema, palpitations. Gastrointestinal: No abdominal distention, no abdominal pain, no change in bowel habits hematochezia or melena. Genitourinary: No dysuria, no frequency, no urgency, no nocturia. Musculoskeletal:No arthralgias, no back pain, no gait disturbance or myalgias. Neurological: No dizziness, no headaches, no numbness, no seizures, no syncope, no  weakness, no tremors. Hematologic: No lymphadenopathy, no easy bruising. Psychiatric: No confusion, no hallucinations, no sleep disturbance.    Physical Exam: Filed Vitals:   07/28/12 0937  BP: 112/56  Pulse: 76   the general appearance reveals a well-developed well-nourished woman in no distress.The head and neck exam reveals pupils equal and reactive.  Extraocular movements are full.  There is no scleral icterus.  The mouth and pharynx are normal.  The neck is supple.  The carotids reveal no bruits.  The jugular venous pressure is normal.  The  thyroid is not enlarged.  There is no lymphadenopathy.  The chest is clear to percussion and auscultation.  There are no rales or rhonchi.  Expansion of the chest is symmetrical.  The precordium is quiet.  The first heart sound is normal.  The second heart sound is physiologically split.  There is no murmur gallop rub or click.  There is no abnormal lift or heave.  The abdomen is soft and nontender.  The bowel sounds are normal.  The liver and spleen are not enlarged.  There are no abdominal masses.  There are no abdominal bruits.  Extremities reveal good pedal pulses.  There is no phlebitis or edema.  There is no cyanosis or clubbing.  Strength is normal and symmetrical in all extremities.  There is no lateralizing weakness.  There are no sensory deficits.  The skin is warm and dry.  There is no rash.     Assessment / Plan: Continue same medication.  Recheck in one year for followup office visit EKG lipid panel hepatic function panel and basal metabolic panel.  Blood work today is pending.  Continue regular exercise including walking and riding an outdoor bicycle as she is doing.  She may need right shoulder surgery and is seeing Dr. Thurston Hole.

## 2012-07-28 NOTE — Patient Instructions (Signed)
Will obtain labs today and call you with the results (LP/BMET/HFP)  ADD BIOTIN DAILY  Your physician wants you to follow-up in: 1 years You will receive a reminder letter in the mail two months in advance. If you don't receive a letter, please call our office to schedule the follow-up appointment.

## 2012-07-28 NOTE — Assessment & Plan Note (Signed)
The patient feels that some of her medication may be causing her hair to thin out.  The patient is on a beta blocker in the form of Toprol which could be contributing.  At this point we will continue low-dose Toprol 12.5 mg a day and add Biotin one tablet daily to help with hair and nail growth.

## 2012-07-28 NOTE — Progress Notes (Signed)
Quick Note:  Please report to patient. The recent labs are stable. Continue same medication and careful diet. ______ 

## 2012-07-28 NOTE — Assessment & Plan Note (Signed)
The patient has a history of dyslipidemia.  We are checking lab work today.  She presently is on fenofibrate 160 mg daily.

## 2012-07-28 NOTE — Assessment & Plan Note (Signed)
The patient has a history of rapid palpitations but there 3 and last less than a minute.  They do not occur often.

## 2012-08-03 ENCOUNTER — Other Ambulatory Visit: Payer: Self-pay | Admitting: Orthopedic Surgery

## 2012-08-03 DIAGNOSIS — M25511 Pain in right shoulder: Secondary | ICD-10-CM

## 2012-08-07 ENCOUNTER — Ambulatory Visit
Admission: RE | Admit: 2012-08-07 | Discharge: 2012-08-07 | Disposition: A | Discharge status: Home / Self Care | Payer: Medicare Other | Source: Ambulatory Visit | Attending: Orthopedic Surgery | Admitting: Orthopedic Surgery

## 2012-08-07 DIAGNOSIS — M25511 Pain in right shoulder: Secondary | ICD-10-CM

## 2012-09-17 ENCOUNTER — Other Ambulatory Visit: Payer: Self-pay | Admitting: Internal Medicine

## 2013-02-01 ENCOUNTER — Other Ambulatory Visit: Payer: Self-pay | Admitting: Internal Medicine

## 2013-02-01 ENCOUNTER — Other Ambulatory Visit: Payer: Self-pay | Admitting: *Deleted

## 2013-02-01 MED ORDER — ESOMEPRAZOLE MAGNESIUM 40 MG PO CPDR: 40.0000 mg | DELAYED_RELEASE_CAPSULE | Freq: Every day | ORAL | Status: DC

## 2013-03-01 ENCOUNTER — Other Ambulatory Visit: Payer: Self-pay | Admitting: Internal Medicine

## 2013-03-10 ENCOUNTER — Encounter: Payer: Self-pay | Admitting: Internal Medicine

## 2013-03-10 ENCOUNTER — Ambulatory Visit (INDEPENDENT_AMBULATORY_CARE_PROVIDER_SITE_OTHER): Payer: Medicare Other | Admitting: Internal Medicine

## 2013-03-10 ENCOUNTER — Other Ambulatory Visit: Payer: Medicare Other

## 2013-03-10 VITALS — BP 118/72 | HR 75 | Temp 97.7°F | Wt 130.4 lb

## 2013-03-10 DIAGNOSIS — J45909 Unspecified asthma, uncomplicated: Secondary | ICD-10-CM

## 2013-03-10 DIAGNOSIS — N390 Urinary tract infection, site not specified: Secondary | ICD-10-CM

## 2013-03-10 DIAGNOSIS — E039 Hypothyroidism, unspecified: Secondary | ICD-10-CM

## 2013-03-10 DIAGNOSIS — Z23 Encounter for immunization: Secondary | ICD-10-CM

## 2013-03-10 LAB — POCT URINALYSIS DIPSTICK
Bilirubin, UA: NEGATIVE
Blood, UA: NEGATIVE
Glucose, UA: NEGATIVE
Nitrite, UA: POSITIVE
Urobilinogen, UA: 0.2

## 2013-03-10 MED ORDER — CIPROFLOXACIN HCL 250 MG PO TABS: 250.0000 mg | ORAL_TABLET | Freq: Two times a day (BID) | ORAL | Status: DC

## 2013-03-10 MED ORDER — FENOFIBRATE 160 MG PO TABS: ORAL_TABLET | ORAL | Status: DC

## 2013-03-10 MED ORDER — ESOMEPRAZOLE MAGNESIUM 40 MG PO CPDR: 40.0000 mg | DELAYED_RELEASE_CAPSULE | Freq: Every day | ORAL | Status: DC

## 2013-03-10 NOTE — Progress Notes (Signed)
Pre-visit discussion using our clinic review tool. No additional management support is needed unless otherwise documented below in the visit note.  

## 2013-03-10 NOTE — Assessment & Plan Note (Signed)
No active symptoms or recent flares. Continue Advair with rescue inhaler as needed

## 2013-03-10 NOTE — Assessment & Plan Note (Signed)
check TSH q6-12 mo and adjust as needed The current medical regimen is effective;  continue present plan and medications.  Lab Results  Component Value Date   TSH 0.93 03/31/2012

## 2013-03-10 NOTE — Patient Instructions (Addendum)
It was good to see you today.  Cipro antibiotics and home delivery refills- Your prescription(s) have been submitted to your local and mail pharmacy as reviewed. Please take as directed and contact our office if you believe you are having problem(s) with the medication(s).  Urine culture ordered today. Your results will be released to MyChart (or called to you) after review, usually within 72hours after test completion. If any changes need to be made, you will be notified at that same time.  Alternate between ibuprofen and tylenol for aches, pain and fever symptoms as needed  Hydrate, rest and call if worse or unimproved  followup annually for medical review and labs as needed, call sooner if problems  Urinary Tract Infection Urinary tract infections (UTIs) can develop anywhere along your urinary tract. Your urinary tract is your body's drainage system for removing wastes and extra water. Your urinary tract includes two kidneys, two ureters, a bladder, and a urethra. Your kidneys are a pair of bean-shaped organs. Each kidney is about the size of your fist. They are located below your ribs, one on each side of your spine. CAUSES Infections are caused by microbes, which are microscopic organisms, including fungi, viruses, and bacteria. These organisms are so small that they can only be seen through a microscope. Bacteria are the microbes that most commonly cause UTIs. SYMPTOMS  Symptoms of UTIs may vary by age and gender of the patient and by the location of the infection. Symptoms in young women typically include a frequent and intense urge to urinate and a painful, burning feeling in the bladder or urethra during urination. Older women and men are more likely to be tired, shaky, and weak and have muscle aches and abdominal pain. A fever may mean the infection is in your kidneys. Other symptoms of a kidney infection include pain in your back or sides below the ribs, nausea, and  vomiting. DIAGNOSIS To diagnose a UTI, your caregiver will ask you about your symptoms. Your caregiver also will ask to provide a urine sample. The urine sample will be tested for bacteria and white blood cells. White blood cells are made by your body to help fight infection. TREATMENT  Typically, UTIs can be treated with medication. Because most UTIs are caused by a bacterial infection, they usually can be treated with the use of antibiotics. The choice of antibiotic and length of treatment depend on your symptoms and the type of bacteria causing your infection. HOME CARE INSTRUCTIONS  If you were prescribed antibiotics, take them exactly as your caregiver instructs you. Finish the medication even if you feel better after you have only taken some of the medication.  Drink enough water and fluids to keep your urine clear or pale yellow.  Avoid caffeine, tea, and carbonated beverages. They tend to irritate your bladder.  Empty your bladder often. Avoid holding urine for long periods of time.  Empty your bladder before and after sexual intercourse.  After a bowel movement, women should cleanse from front to back. Use each tissue only once. SEEK MEDICAL CARE IF:   You have back pain.  You develop a fever.  Your symptoms do not begin to resolve within 3 days. SEEK IMMEDIATE MEDICAL CARE IF:   You have severe back pain or lower abdominal pain.  You develop chills.  You have nausea or vomiting.  You have continued burning or discomfort with urination. MAKE SURE YOU:   Understand these instructions.  Will watch your condition.  Will get help  right away if you are not doing well or get worse. Document Released: 12/18/2004 Document Revised: 09/09/2011 Document Reviewed: 04/18/2011 [facility] Patient Information 2014 [place], [place].

## 2013-03-10 NOTE — Progress Notes (Signed)
HPI: complains of UTI symptoms Onset 4 days ago, progressively worse associated with dysuria and small volume voiding with increased frequency denies hematuria, flank pain or fever The patient has a history of prior UTI  PMH: reviewed  ROS:  Gen.: No unexpected weight change, no night sweats Lungs: No cough or shortness of breath Cardiovascular: No palpitations or chest pain  PE: BP 118/72  Pulse 75  Temp(Src) 97.7 F (36.5 C) (Oral)  Wt 130 lb 6.4 oz (59.149 kg)  SpO2 97% General: No acute distress Lungs: Clear to auscultation Cardiovascular: Regular rate rhythm, no edema Abdomen: Mild to moderate discomfort of her suprapubic region, no flank tenderness to palpation  Lab Results  Component Value Date   GLUCOSE 91 07/28/2012   CHOL 208* 07/28/2012   TRIG 71.0 07/28/2012   HDL 95.00 07/28/2012   LDLDIRECT 94.2 07/28/2012   LDLCALC 88 01/27/2011   ALT 19 07/28/2012   AST 22 07/28/2012   NA 139 07/28/2012   K 4.4 07/28/2012   CL 103 07/28/2012   CREATININE 0.7 07/28/2012   BUN 20 07/28/2012   CO2 30 07/28/2012   TSH 0.93 03/31/2012   Udip POC +nitrate, +LE  Assessment/Plan: UTI, classic symptoms with history of same  Empiric antibiotic x7 days Urine culture for identification and sensitivities Hydration recommended education provided

## 2013-03-11 NOTE — Addendum Note (Signed)
Addended by: Deatra James on: 03/11/2013 09:31 AM   Modules accepted: Orders

## 2013-03-12 LAB — URINE CULTURE: Colony Count: >=100000

## 2013-03-14 ENCOUNTER — Encounter: Payer: Self-pay | Admitting: Internal Medicine

## 2013-04-09 ENCOUNTER — Other Ambulatory Visit: Payer: Self-pay | Admitting: Internal Medicine

## 2013-05-15 ENCOUNTER — Other Ambulatory Visit: Payer: Self-pay | Admitting: Internal Medicine

## 2013-05-30 ENCOUNTER — Ambulatory Visit: Payer: Medicare Other | Admitting: Physician Assistant

## 2013-05-31 ENCOUNTER — Ambulatory Visit (INDEPENDENT_AMBULATORY_CARE_PROVIDER_SITE_OTHER): Payer: Medicare Other | Admitting: Physician Assistant

## 2013-05-31 ENCOUNTER — Encounter: Payer: Self-pay | Admitting: Physician Assistant

## 2013-05-31 ENCOUNTER — Ambulatory Visit (INDEPENDENT_AMBULATORY_CARE_PROVIDER_SITE_OTHER)
Admission: RE | Admit: 2013-05-31 | Discharge: 2013-05-31 | Disposition: A | Discharge status: Home / Self Care | Payer: Medicare Other | Source: Ambulatory Visit | Attending: Physician Assistant | Admitting: Physician Assistant

## 2013-05-31 VITALS — BP 110/60 | HR 89 | Temp 100.3°F | Wt 131.0 lb

## 2013-05-31 DIAGNOSIS — R399 Unspecified symptoms and signs involving the genitourinary system: Secondary | ICD-10-CM

## 2013-05-31 DIAGNOSIS — R05 Cough: Secondary | ICD-10-CM

## 2013-05-31 DIAGNOSIS — R059 Cough, unspecified: Secondary | ICD-10-CM

## 2013-05-31 DIAGNOSIS — J069 Acute upper respiratory infection, unspecified: Secondary | ICD-10-CM

## 2013-05-31 DIAGNOSIS — R3989 Other symptoms and signs involving the genitourinary system: Secondary | ICD-10-CM

## 2013-05-31 LAB — POCT URINALYSIS DIPSTICK
Bilirubin, UA: NEGATIVE
Blood, UA: NEGATIVE
Glucose, UA: NEGATIVE
KETONES UA: NEGATIVE
LEUKOCYTES UA: NEGATIVE
NITRITE UA: NEGATIVE
PROTEIN UA: NEGATIVE
Spec Grav, UA: 1.025
Urobilinogen, UA: 0.2
pH, UA: 6.5

## 2013-05-31 MED ORDER — LEVOFLOXACIN 500 MG PO TABS: 500.0000 mg | ORAL_TABLET | Freq: Every day | ORAL | Status: DC

## 2013-05-31 NOTE — Progress Notes (Signed)
Subjective:    Patient ID: Elizabeth Mills, female    DOB: 1945-07-14, 68 y.o.   MRN: [id number]  HPI Comments: Also complains of mild low back pain. Has kept grandchildren in the last couple weeks more than normal. Is not sure if related to that. States having urinary symptoms of frequency and urgency. Denies blood in urine or dysuria.  Cough This is a new problem. The current episode started in the past 7 days. The problem has been gradually worsening. The cough is productive of sputum. Associated symptoms include ear congestion, ear pain (feel full), a fever (low grade, 99-100*) and postnasal drip. Pertinent negatives include no chest pain, chills, nasal congestion, rhinorrhea, sore throat or shortness of breath. Treatments tried: claritin and mucinex. The treatment provided mild relief.  Fever  This is a new problem. The current episode started in the past 7 days. The problem occurs intermittently. The problem has been unchanged. The maximum temperature noted was 99 to 99.9 F. The temperature was taken using an oral thermometer. Associated symptoms include coughing (productive of yellow green mucus) and ear pain (feel full). Pertinent negatives include no chest pain, nausea, sore throat or vomiting.    Review of Systems  Constitutional: Positive for fever (low grade, 99-100*). Negative for chills.  HENT: Positive for ear pain (feel full) and postnasal drip. Negative for rhinorrhea, sore throat, trouble swallowing and voice change.   Respiratory: Positive for cough (productive of yellow green mucus). Negative for shortness of breath.   Cardiovascular: Negative for chest pain.  Gastrointestinal: Negative for nausea and vomiting.  Genitourinary: Positive for urgency and frequency. Negative for dysuria.  Neurological: Positive for dizziness.       Past Medical History  Diagnosis Date  . Hypercholesterolemia   . Back pain   . Generalized headaches   . Colitis   . Hypothyroidism   .  Asthma   . Diverticulitis   . Allergic rhinitis, cause unspecified   . GERD (gastroesophageal reflux disease)    Current Outpatient Prescriptions on File Prior to Visit  Medication Sig Dispense Refill  . ADVAIR DISKUS 250-50 MCG/DOSE AEPB USE ONE INHALATION EVERY 12 HOURS  3 each  3  . aspirin 81 MG tablet Take 81 mg by mouth as needed.        Marland Kitchen BIOTIN PO Take by mouth daily.      Marland Kitchen conjugated estrogens (PREMARIN) vaginal cream Place vaginally 3 (three) times a week.  42.5 g  12  . esomeprazole (NEXIUM) 40 MG capsule Take 1 capsule (40 mg total) by mouth daily before breakfast.  90 capsule  3  . fenofibrate 160 MG tablet TAKE 1 TABLET (160 MG TOTAL) DAILY  90 tablet  3  . fish oil-omega-3 fatty acids 1000 MG capsule Take 1 g by mouth daily.      . fluticasone (FLONASE) 50 MCG/ACT nasal spray Place 2 sprays into the nose daily.  48 g  0  . levothyroxine (SYNTHROID, LEVOTHROID) 75 MCG tablet TAKE 1 TABLET DAILY  90 tablet  3  . loratadine (CLARITIN) 10 MG tablet Take 1 tablet (10 mg total) by mouth daily.  30 tablet  11  . metoprolol succinate (TOPROL-XL) 25 MG 24 hr tablet TAKE ONE-HALF (1/2) TABLET (12.5 MG TOTAL) DAILY  90 tablet  0  . PROAIR HFA 108 (90 BASE) MCG/ACT inhaler INHALE 2 PUFFS EVERY 4 HOURS AS NEEDED FOR WHEEZING OR SHORTNESS OF BREATH  3 each  2   No current facility-administered  medications on file prior to visit.     Objective:   Physical Exam  Vitals reviewed. Constitutional: She is oriented to person, place, and time. She appears well-developed and well-nourished. No distress.  HENT:  Head: Normocephalic and atraumatic.  Right Ear: Tympanic membrane, external ear and ear canal normal. Tympanic membrane is not injected, not erythematous and not bulging.  Left Ear: Tympanic membrane, external ear and ear canal normal. Tympanic membrane is not injected, not erythematous and not bulging.  Nose: Mucosal edema (with mild erythema) and rhinorrhea present. Right sinus  exhibits frontal sinus tenderness. Left sinus exhibits frontal sinus tenderness.  Mouth/Throat: Uvula is midline and mucous membranes are normal. Posterior oropharyngeal erythema (mild with clear PND noted) present. No oropharyngeal exudate or posterior oropharyngeal edema.  Eyes: Conjunctivae are normal.  Neck: Normal range of motion.  Cardiovascular: Normal rate and regular rhythm.  Exam reveals no gallop and no friction rub.   No murmur heard. Pulmonary/Chest: Effort normal and breath sounds normal. She has no wheezes. She has no rales.  Abdominal: Soft. Normal appearance and bowel sounds are normal. There is no hepatosplenomegaly. There is no tenderness. There is no rigidity, no guarding and no CVA tenderness.  Musculoskeletal: Normal range of motion.  Neurological: She is alert and oriented to person, place, and time.  Skin: Skin is warm and dry. No rash noted. She is not diaphoretic.     Results for orders placed in visit on 05/31/13  POCT URINALYSIS DIPSTICK      Result Value Ref Range   Color, UA yellow     Clarity, UA clear     Glucose, UA neg     Bilirubin, UA neg     Ketones, UA neg     Spec Grav, UA 1.025     Blood, UA neg     pH, UA 6.5     Protein, UA neg     Urobilinogen, UA 0.2     Nitrite, UA neg     Leukocytes, UA Negative      Filed Vitals:   05/31/13 0811  BP: 110/60  Pulse: 89  Temp: 100.3 F (37.9 C)       Assessment & Plan:   URI, acute Cough  Lower urinary track symptoms  Febrile today with productive cough Chest xray today Levaquin prescribed to cover for URI and urinary symptoms. POC urine negative however, symptomatic

## 2013-05-31 NOTE — Progress Notes (Signed)
Pre visit review using our clinic review tool, if applicable. No additional management support is needed unless otherwise documented below in the visit note. 

## 2013-05-31 NOTE — Patient Instructions (Signed)
It was great to meet you today Ms. Loughney!  I have ordered a chest xray, please report to the basement to the radiology department.  Upper Respiratory Infection, Adult An upper respiratory infection (URI) is also known as the common cold. It is often caused by a type of germ (virus). Colds are easily spread (contagious). You can pass it to others by kissing, coughing, sneezing, or drinking out of the same glass. Usually, you get better in 1 or 2 weeks.  HOME CARE   Only take medicine as told by your doctor.  Use a warm mist humidifier or breathe in steam from a hot shower.  Drink enough water and fluids to keep your pee (urine) clear or pale yellow.  Get plenty of rest.  Return to work when your temperature is back to normal or as told by your doctor. You may use a face mask and wash your hands to stop your cold from spreading. GET HELP RIGHT AWAY IF:   After the first few days, you feel you are getting worse.  You have questions about your medicine.  You have chills, shortness of breath, or brown or red spit (mucus).  You have yellow or brown snot (nasal discharge) or pain in the face, especially when you bend forward.  You have a fever, puffy (swollen) neck, pain when you swallow, or white spots in the back of your throat.  You have a bad headache, ear pain, sinus pain, or chest pain.  You have a high-pitched whistling sound when you breathe in and out (wheezing).  You have a lasting cough or cough up blood.  You have sore muscles or a stiff neck. MAKE SURE YOU:   Understand these instructions.  Will watch your condition.  Will get help right away if you are not doing well or get worse. Document Released: 08/27/2007 Document Revised: 06/02/2011 Document Reviewed: 07/15/2010 [facility] Patient Information 2014 [place], [place]

## 2013-07-04 ENCOUNTER — Other Ambulatory Visit: Payer: Self-pay

## 2013-07-04 DIAGNOSIS — Z1231 Encounter for screening mammogram for malignant neoplasm of breast: Secondary | ICD-10-CM

## 2013-07-11 ENCOUNTER — Telehealth: Payer: Self-pay

## 2013-07-11 MED ORDER — LORATADINE 10 MG PO TABS: 10.0000 mg | ORAL_TABLET | Freq: Every day | ORAL | Status: DC

## 2013-07-11 NOTE — Telephone Encounter (Signed)
The patient called and is hoping to get a refill of her Claritin rx   Cell - [phone]

## 2013-07-11 NOTE — Telephone Encounter (Signed)
Called pt to verify which pharmacy she want claritin to go to. [facility].../lmb

## 2013-07-14 ENCOUNTER — Ambulatory Visit
Admission: RE | Admit: 2013-07-14 | Discharge: 2013-07-14 | Disposition: A | Discharge status: Home / Self Care | Payer: Self-pay | Source: Ambulatory Visit

## 2013-07-14 DIAGNOSIS — Z1231 Encounter for screening mammogram for malignant neoplasm of breast: Secondary | ICD-10-CM

## 2013-08-14 ENCOUNTER — Other Ambulatory Visit: Payer: Self-pay | Admitting: Internal Medicine

## 2013-08-28 ENCOUNTER — Other Ambulatory Visit: Payer: Self-pay | Admitting: Internal Medicine

## 2013-11-21 ENCOUNTER — Telehealth: Payer: Self-pay | Admitting: *Deleted

## 2013-11-21 NOTE — Telephone Encounter (Signed)
[facility] Triage Call Report Triage Record Num: [id number] Operator: Gustavus Bryant Patient Name: Elizabeth Mills Call Date & Time: 11/20/2013 3:48:43PM Patient Phone: [phone] PCP: Patient Gender: Female PCP Fax : Patient DOB: 08-04-1945 Practice Name: [facility] Reason for Call: Caller: Ald[place]/Patient; PCP: Rene Paci (Adults only); CB#: [phone]; Call regarding Cough/Congestion; pt states that she has a temp of 100.1 and has a cough, is wheezing and has cold symptoms, states that she has been around her grandchildren that have been sick. States that she is very sore from coughing. Decreased appetite, but is drinking. States that she is coughing up yellow/green mucus. Triaged per Asthma - Adult Guideline. Dispositioned to See Provider within 4 hours per "New onset worsening cough and asthma with increasing frequency of flare ups since last scheduled appt". Advised pt that she can go to [facility] at [address]. Gave pt directions and advised that they are open until 7pm. Pt verbalizes understanding and states that she will go now. Advised to call back with any other questions or concerns Protocol(s) Used: Asthma - Adult Recommended Outcome per Protocol: See Provider within 4 hours Reason for Outcome: New onset or worsening cough AND asthma with increasing frequency of flare-ups since last scheduled appointment Care Advice: ~ Use prescribed rescue medication (inhaler, nebulizer) as directed. ~ SYMPTOM / CONDITION MANAGEMENT 08/

## 2013-12-30 ENCOUNTER — Ambulatory Visit (INDEPENDENT_AMBULATORY_CARE_PROVIDER_SITE_OTHER): Payer: Medicare Other | Admitting: Family

## 2013-12-30 ENCOUNTER — Encounter: Payer: Self-pay | Admitting: Family

## 2013-12-30 ENCOUNTER — Other Ambulatory Visit: Payer: Medicare Other

## 2013-12-30 VITALS — BP 104/70 | HR 75 | Temp 98.2°F | Resp 18 | Ht 62.0 in | Wt 128.0 lb

## 2013-12-30 DIAGNOSIS — Z23 Encounter for immunization: Secondary | ICD-10-CM

## 2013-12-30 DIAGNOSIS — R300 Dysuria: Secondary | ICD-10-CM

## 2013-12-30 LAB — POCT URINALYSIS DIPSTICK
BILIRUBIN UA: NEGATIVE
Glucose, UA: NEGATIVE
Ketones, UA: NEGATIVE
NITRITE UA: NEGATIVE
PH UA: 5
PROTEIN UA: NEGATIVE
Spec Grav, UA: 1.015
UROBILINOGEN UA: 0.2

## 2013-12-30 MED ORDER — CIPROFLOXACIN HCL 250 MG PO TABS
250.0000 mg | ORAL_TABLET | Freq: Two times a day (BID) | ORAL | Status: DC
Start: 2013-12-30 — End: 2014-01-25

## 2013-12-30 NOTE — Addendum Note (Signed)
Addended by: Mercer PodWRENN, Azreal Stthomas E on: 12/30/2013 01:36 PM   Modules accepted: Orders

## 2013-12-30 NOTE — Progress Notes (Signed)
Subjective:    Patient ID: Elizabeth Mills, female    DOB: [birth year], 68 y.o.   MRN: [id number]  HPI:  Elizabeth Mills is a 68 y.o. female who presents today for signs of a kidney infection. Indicates her urine has been cloudy the last couple of mornings. There is some burning and increased frequency. Denies any fevers, chills or back pain.  Notes previous UTI she had presented with decreased symptoms. Also was treated recently for an upper respiratory infection.   Allergies  Allergen Reactions  . Crestor [Rosuvastatin Calcium]     Gi issues  . Lipitor [Atorvastatin Calcium]     Gi symp  . Phenergan [Promethazine Hcl]   . Pravachol     Gi issues  . Promethazine Hcl   . Sulfa Antibiotics   . Zetia [Ezetimibe]     Gi issues  . Zocor [Simvastatin - High Dose]     Gi issues   Current Outpatient Prescriptions on File Prior to Visit  Medication Sig Dispense Refill  . ADVAIR DISKUS 250-50 MCG/DOSE AEPB USE ONE INHALATION EVERY 12 HOURS  3 each  2  . aspirin 81 MG tablet Take 81 mg by mouth as needed.        Marland Kitchen. BIOTIN PO Take by mouth daily.      Marland Kitchen. conjugated estrogens (PREMARIN) vaginal cream Place vaginally 3 (three) times a week.  42.5 g  12  . esomeprazole (NEXIUM) 40 MG capsule Take 1 capsule (40 mg total) by mouth daily before breakfast.  90 capsule  3  . fenofibrate 160 MG tablet TAKE 1 TABLET (160 MG TOTAL) DAILY  90 tablet  3  . fluticasone (FLONASE) 50 MCG/ACT nasal spray Place 2 sprays into the nose daily.  48 g  0  . levothyroxine (SYNTHROID, LEVOTHROID) 75 MCG tablet TAKE 1 TABLET DAILY  90 tablet  3  . loratadine (CLARITIN) 10 MG tablet Take 1 tablet (10 mg total) by mouth daily.  30 tablet  11  . metoprolol succinate (TOPROL-XL) 25 MG 24 hr tablet TAKE ONE-HALF (1/2) TABLET (12.5 MG TOTAL) DAILY (OVERDUE FOR YEARLY PHYSICAL MUST SEE MD BEFORE ADDITIONAL REFILLS)  90 tablet  0  . PROAIR HFA 108 (90 BASE) MCG/ACT inhaler INHALE 2 PUFFS EVERY 4 HOURS AS NEEDED FOR  WHEEZING OR SHORTNESS OF BREATH  3 each  2  . fish oil-omega-3 fatty acids 1000 MG capsule Take 1 g by mouth daily.      Marland Kitchen. levofloxacin (LEVAQUIN) 500 MG tablet Take 1 tablet (500 mg total) by mouth daily.  7 tablet  0   No current facility-administered medications on file prior to visit.   Past Medical History  Diagnosis Date  . Hypercholesterolemia   . Back pain   . Generalized headaches   . Colitis   . Hypothyroidism   . Asthma   . Diverticulitis   . Allergic rhinitis, cause unspecified   . GERD (gastroesophageal reflux disease)       Review of Systems  See HPI.    Objective:     BP 104/70  Pulse 75  Temp(Src) 98.2 F (36.8 C) (Oral)  Resp 18  Ht 5\' 2"  (1.575 m)  Wt 128 lb (58.06 kg)  BMI 23.41 kg/m2  SpO2 98% Nursing note and vital signs reviewed.  Physical Exam  Constitutional: She is oriented to person, place, and time. She appears well-developed and well-nourished.  Cardiovascular: Normal rate, regular rhythm and normal heart sounds.   Pulmonary/Chest: Effort  normal and breath sounds normal.  No costovertebral tenderness  Neurological: She is alert and oriented to person, place, and time.  Skin: Skin is warm and dry.  Psychiatric: She has a normal mood and affect. Her behavior is normal. Judgment and thought content normal.        Assessment & Plan:

## 2013-12-30 NOTE — Assessment & Plan Note (Signed)
POCT was positive for leukocytes. Start Cipro x 3 days. Urine culture sent. Instructed to drink plenty of fluids and follow-up if symptoms worsen or fail to improve.

## 2013-12-30 NOTE — Addendum Note (Signed)
Addended by: Jeanine LuzALONE, GREGORY D on: 12/30/2013 01:09 PM   Modules accepted: Orders

## 2013-12-30 NOTE — Addendum Note (Signed)
Addended by: Mercer PodWRENN, Braelynn Lupton E on: 12/30/2013 04:48 PM   Modules accepted: Orders

## 2013-12-30 NOTE — Patient Instructions (Signed)
Thank you for choosing [facility].  Summary/Instructions:   An antibiotic is being sent to your pharmacy. If you symptoms do not improve please let us know.  You have also received your tetanus shot and flu shot.   Please schedule a time for a preventative exam at your convenience.

## 2014-01-02 LAB — URINE CULTURE: Colony Count: >=100000

## 2014-01-25 ENCOUNTER — Ambulatory Visit (INDEPENDENT_AMBULATORY_CARE_PROVIDER_SITE_OTHER): Payer: Medicare Other | Admitting: Family

## 2014-01-25 ENCOUNTER — Other Ambulatory Visit (INDEPENDENT_AMBULATORY_CARE_PROVIDER_SITE_OTHER): Payer: Medicare Other

## 2014-01-25 ENCOUNTER — Encounter: Payer: Self-pay | Admitting: Family

## 2014-01-25 VITALS — BP 100/68 | HR 63 | Temp 97.8°F | Resp 18 | Ht 63.0 in | Wt 125.8 lb

## 2014-01-25 DIAGNOSIS — M7071 Other bursitis of hip, right hip: Secondary | ICD-10-CM

## 2014-01-25 DIAGNOSIS — Z1283 Encounter for screening for malignant neoplasm of skin: Secondary | ICD-10-CM

## 2014-01-25 DIAGNOSIS — Z0000 Encounter for general adult medical examination without abnormal findings: Secondary | ICD-10-CM

## 2014-01-25 DIAGNOSIS — M7070 Other bursitis of hip, unspecified hip: Secondary | ICD-10-CM | POA: Insufficient documentation

## 2014-01-25 DIAGNOSIS — E039 Hypothyroidism, unspecified: Secondary | ICD-10-CM

## 2014-01-25 DIAGNOSIS — Z23 Encounter for immunization: Secondary | ICD-10-CM

## 2014-01-25 DIAGNOSIS — E780 Pure hypercholesterolemia: Secondary | ICD-10-CM

## 2014-01-25 LAB — CBC
HEMATOCRIT: 43.4 % (ref 36.0–46.0)
HEMOGLOBIN: 14.6 g/dL (ref 12.0–15.0)
MCHC: 33.6 g/dL (ref 30.0–36.0)
MCV: 90.4 fl (ref 78.0–100.0)
PLATELETS: 308 10*3/uL (ref 150.0–400.0)
RBC: 4.8 Mil/uL (ref 3.87–5.11)
RDW: 13.5 % (ref 11.5–15.5)
WBC: 4.4 10*3/uL (ref 4.0–10.5)

## 2014-01-25 LAB — LIPID PANEL
Cholesterol: 251 mg/dL — ABNORMAL HIGH (ref 0–200)
HDL: 104.1 mg/dL (ref >39.00)
LDL Cholesterol: 137 mg/dL — ABNORMAL HIGH (ref 0–99)
NonHDL: 146.9
TRIGLYCERIDES: 50 mg/dL (ref 0.0–149.0)
Total CHOL/HDL Ratio: 2
VLDL: 10 mg/dL (ref 0.0–40.0)

## 2014-01-25 LAB — BASIC METABOLIC PANEL
BUN: 17 mg/dL (ref 6–23)
CHLORIDE: 105 meq/L (ref 96–112)
CO2: 27 meq/L (ref 19–32)
Calcium: 9.6 mg/dL (ref 8.4–10.5)
Creatinine, Ser: 0.8 mg/dL (ref 0.4–1.2)
GFR: 80.37 mL/min (ref >60.00)
Glucose, Bld: 92 mg/dL (ref 70–99)
POTASSIUM: 4.3 meq/L (ref 3.5–5.1)
SODIUM: 139 meq/L (ref 135–145)

## 2014-01-25 NOTE — Patient Instructions (Addendum)
You have completed your wellness exam. You are doing a great job keeping yourself healthy. Please plan to follow up in about 6 months for your thyroid check.  Please stop by the lab prior to leaving for your blood work. We will be in touch regarding the results.   You have been referred to dermatology, you should hear back in about a week to schedule an appointment.

## 2014-01-25 NOTE — Assessment & Plan Note (Signed)
1) Anticipatory Guidance: Discussed importance of wearing a seatbelt while driving and not texting while driving; changing batteries in smoke detector at least once annually; wearing suntan lotion when outside; eating a balanced and moderate diet; getting physical activity at least 30 minutes per day; decreasing risk for falling by removing throw rugs and clutter on the floor.   2) Immunizations / Screenings / Labs:  Given Prevnar - all other immunizations are up to date. / All screenings are up to date. / obtain BMET, CBC, Lipid profile, and TSH.  Wellness exam grossly normal. Continue current health behaviors and maintenance. Follow up in 1 year or sooner if needed.

## 2014-01-25 NOTE — Addendum Note (Signed)
Addended by: Mercer PodWRENN, Darcell Sabino E on: 01/25/2014 01:43 PM   Modules accepted: Orders

## 2014-01-25 NOTE — Progress Notes (Signed)
Pre visit review using our clinic review tool, if applicable. No additional management support is needed unless otherwise documented below in the visit note. 
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
## 2014-01-25 NOTE — Assessment & Plan Note (Signed)
Would like to try conservative care with OTC NSAIDs as needed, ice and stretching. Will seek further care if symptoms progressively worsen.

## 2014-01-27 LAB — TSH: TSH: 1.6 u[IU]/mL (ref 0.35–4.50)

## 2014-01-28 ENCOUNTER — Other Ambulatory Visit: Payer: Self-pay | Admitting: Internal Medicine

## 2014-01-28 ENCOUNTER — Encounter: Payer: Self-pay | Admitting: Family

## 2014-03-07 DIAGNOSIS — H2513 Age-related nuclear cataract, bilateral: Secondary | ICD-10-CM | POA: Insufficient documentation

## 2014-03-07 DIAGNOSIS — H53001 Unspecified amblyopia, right eye: Secondary | ICD-10-CM | POA: Insufficient documentation

## 2014-03-28 ENCOUNTER — Ambulatory Visit (INDEPENDENT_AMBULATORY_CARE_PROVIDER_SITE_OTHER): Payer: Medicare Other | Admitting: Internal Medicine

## 2014-03-28 ENCOUNTER — Encounter: Payer: Self-pay | Admitting: Internal Medicine

## 2014-03-28 VITALS — BP 112/70 | HR 79 | Temp 98.1°F | Ht 62.0 in | Wt 131.4 lb

## 2014-03-28 DIAGNOSIS — J069 Acute upper respiratory infection, unspecified: Secondary | ICD-10-CM

## 2014-03-28 MED ORDER — AMOXICILLIN 500 MG PO CAPS: 500.0000 mg | ORAL_CAPSULE | Freq: Three times a day (TID) | ORAL | Status: DC

## 2014-03-28 NOTE — Progress Notes (Signed)
Pre visit review using our clinic review tool, if applicable. No additional management support is needed unless otherwise documented below in the visit note. 

## 2014-03-28 NOTE — Patient Instructions (Signed)
Plain Mucinex (NOT D) for thick secretions ;force NON dairy fluids .   Nasal cleansing in the shower as discussed with lather of mild shampoo.After 10 seconds wash off lather while  exhaling through nostrils. Make sure that all residual soap is removed to prevent irritation.  Flonase OR Nasacort AQ 1 spray in each nostril twice a day as needed. Use the "crossover" technique into opposite nostril spraying toward opposite ear @ 45 degree angle, not straight up into nostril.  Plain Allegra (NOT D )  160 daily , Loratidine 10 mg , OR Zyrtec 10 mg @ bedtime  as needed for itchy eyes & sneezing  .Zicam Melts or Zinc lozenges as per package label for sore throat . Complementary options include  vitamin C 2000 mg daily; & Echinacea for 4-7 days.  

## 2014-03-28 NOTE — Progress Notes (Signed)
   Subjective:    Patient ID: Elizabeth Mills, female    DOB: 1946-02-22, 69 y.o.   MRN: [id number]  HPI  Her symptoms began 2 weeks ago as constant throat clearing in the context of postnasal drainage. She developed nasal congestion, especially in the morning   She improved using Advil, a Neti pot, and Flonase until 1/3. Over the next 24 hrs she had progressive sore throat.  She also had paranasal pressure discomfort & some discomfort in the left maxillary teeth  She's been having green drainage from the nose. She's had minor sneezing. She also describes low-grade fever.  Lower respiratory tract symptoms are absent.  Review of Systems She is not having frontal sinus and maxillary sinus pain per se  She denies itchy, watery eyes  She also denies significant cough, sputum production, wheezing, shortness of breath.    Objective:   Physical Exam  Positive or pertinent findings include: There is mild scarring of the tympanic membranes She has erythema of the mucosa. There is slight septal deviation to the left.  General appearance:good health ;well nourished; no acute distress or increased work of breathing is present.  No  lymphadenopathy about the head, neck, or axilla noted.  Eyes: No conjunctival inflammation or lid edema is present. There is no scleral icterus. Ears:  External ear exam shows no significant lesions or deformities.  Otoscopic examination reveals clear canals, tympanic membranes are intact bilaterally without bulging, retraction, inflammation or discharge. Nose:  External nasal examination shows no deformity or inflammation.  Oral exam: Dental hygiene is good; lips and gums are healthy appearing.There is no oropharyngeal erythema or exudate noted.  Neck:  No deformities, thyromegaly, masses, or tenderness noted.   Supple with full range of motion without pain.  Heart:  Normal rate and regular rhythm. S1 and S2 normal without gallop, murmur, click, rub or other extra  sounds.  Lungs:Chest clear to auscultation; no wheezes, rhonchi,rales ,or rubs present.No increased work of breathing.   Extremities:  No cyanosis, edema, or clubbing  noted  Skin: Warm & dry w/o jaundice or tenting.       Assessment & Plan:  #1 upper respiratory infection with pharyngitis  Plan: See orders and recommendations

## 2014-04-16 ENCOUNTER — Other Ambulatory Visit: Payer: Self-pay | Admitting: Internal Medicine

## 2014-06-10 ENCOUNTER — Other Ambulatory Visit: Payer: Self-pay | Admitting: Internal Medicine

## 2014-07-06 ENCOUNTER — Other Ambulatory Visit: Payer: Self-pay | Admitting: Internal Medicine

## 2014-07-25 ENCOUNTER — Other Ambulatory Visit: Payer: Self-pay | Admitting: Internal Medicine

## 2014-07-25 NOTE — Telephone Encounter (Signed)
Left message advising rx for advair has been sent in

## 2014-08-03 ENCOUNTER — Other Ambulatory Visit: Payer: Self-pay

## 2014-08-03 DIAGNOSIS — Z1231 Encounter for screening mammogram for malignant neoplasm of breast: Secondary | ICD-10-CM

## 2014-08-31 ENCOUNTER — Ambulatory Visit: Payer: Medicare Other

## 2014-09-19 ENCOUNTER — Telehealth: Payer: Self-pay | Admitting: Cardiology

## 2014-09-19 NOTE — Telephone Encounter (Signed)
Scheduled appointment for patient on Friday and advised Per patient she saw her eye doctor over a month ago

## 2014-09-19 NOTE — Telephone Encounter (Signed)
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 Patty SermonsBrackbill..Marland Kitchen

## 2014-09-22 ENCOUNTER — Ambulatory Visit (INDEPENDENT_AMBULATORY_CARE_PROVIDER_SITE_OTHER): Payer: Medicare Other | Admitting: Cardiology

## 2014-09-22 ENCOUNTER — Encounter: Payer: Self-pay | Admitting: Cardiology

## 2014-09-22 VITALS — BP 130/62 | HR 67 | Ht 62.5 in | Wt 129.8 lb

## 2014-09-22 DIAGNOSIS — E7800 Pure hypercholesterolemia, unspecified: Secondary | ICD-10-CM

## 2014-09-22 DIAGNOSIS — E038 Other specified hypothyroidism: Secondary | ICD-10-CM

## 2014-09-22 DIAGNOSIS — Z8669 Personal history of other diseases of the nervous system and sense organs: Secondary | ICD-10-CM

## 2014-09-22 DIAGNOSIS — I471 Supraventricular tachycardia: Secondary | ICD-10-CM

## 2014-09-22 LAB — CBC WITH DIFFERENTIAL/PLATELET
Basophils Absolute: 0 10*3/uL (ref 0.0–0.1)
Basophils Relative: 0.6 % (ref 0.0–3.0)
Eosinophils Absolute: 0.2 10*3/uL (ref 0.0–0.7)
Eosinophils Relative: 6.5 % — ABNORMAL HIGH (ref 0.0–5.0)
HCT: 39.7 % (ref 36.0–46.0)
HEMOGLOBIN: 13.7 g/dL (ref 12.0–15.0)
LYMPHS ABS: 1.3 10*3/uL (ref 0.7–4.0)
LYMPHS PCT: 35.9 % (ref 12.0–46.0)
MCHC: 34.6 g/dL (ref 30.0–36.0)
MCV: 90.5 fl (ref 78.0–100.0)
Monocytes Absolute: 0.4 10*3/uL (ref 0.1–1.0)
Monocytes Relative: 10.9 % (ref 3.0–12.0)
Neutro Abs: 1.7 10*3/uL (ref 1.4–7.7)
Neutrophils Relative %: 46.1 % (ref 43.0–77.0)
Platelets: 266 10*3/uL (ref 150.0–400.0)
RBC: 4.38 Mil/uL (ref 3.87–5.11)
RDW: 13.4 % (ref 11.5–15.5)
WBC: 3.7 10*3/uL — AB (ref 4.0–10.5)

## 2014-09-22 LAB — TSH: TSH: 0.89 u[IU]/mL (ref 0.35–4.50)

## 2014-09-22 LAB — HEPATIC FUNCTION PANEL
ALT: 14 U/L (ref 0–35)
AST: 19 U/L (ref 0–37)
Albumin: 3.8 g/dL (ref 3.5–5.2)
Alkaline Phosphatase: 31 U/L — ABNORMAL LOW (ref 39–117)
BILIRUBIN DIRECT: 0.1 mg/dL (ref 0.0–0.3)
BILIRUBIN TOTAL: 0.5 mg/dL (ref 0.2–1.2)
Total Protein: 7 g/dL (ref 6.0–8.3)

## 2014-09-22 LAB — BASIC METABOLIC PANEL
BUN: 20 mg/dL (ref 6–23)
CO2: 29 mEq/L (ref 19–32)
Calcium: 9.1 mg/dL (ref 8.4–10.5)
Chloride: 104 mEq/L (ref 96–112)
Creatinine, Ser: 0.79 mg/dL (ref 0.40–1.20)
GFR: 76.71 mL/min (ref >60.00)
Glucose, Bld: 95 mg/dL (ref 70–99)
POTASSIUM: 4.1 meq/L (ref 3.5–5.1)
SODIUM: 140 meq/L (ref 135–145)

## 2014-09-22 LAB — LIPID PANEL
Cholesterol: 200 mg/dL (ref 0–200)
HDL: 89.5 mg/dL (ref >39.00)
LDL Cholesterol: 102 mg/dL — ABNORMAL HIGH (ref 0–99)
NonHDL: 110.5
Total CHOL/HDL Ratio: 2
Triglycerides: 44 mg/dL (ref 0.0–149.0)
VLDL: 8.8 mg/dL (ref 0.0–40.0)

## 2014-09-22 LAB — C-REACTIVE PROTEIN: CRP: 0.1 mg/dL — ABNORMAL LOW (ref 0.5–20.0)

## 2014-09-22 LAB — T4, FREE: Free T4: 1.06 ng/dL (ref 0.60–1.60)

## 2014-09-22 LAB — SEDIMENTATION RATE: Sed Rate: 10 mm/hr (ref 0–22)

## 2014-09-22 NOTE — Patient Instructions (Signed)
Medication Instructions:  Your physician recommends that you continue on your current medications as directed. Please refer to the Current Medication list given to you today.  Labwork: Cbc/lp/bmet/hfp/ths/ft4/sed rate/crp  Testing/Procedures: none  Follow-Up: Your physician wants you to follow-up in: 6 month ov You will receive a reminder letter in the mail two months in advance. If you don't receive a letter, please call our office to schedule the follow-up appointment.

## 2014-09-22 NOTE — Progress Notes (Signed)
Cardiology Office Note   Date:  09/22/2014   ID:  Annely, Sliva [birth year], MRN [id number]  PCP:  Gwendolyn Grant, MD  Cardiologist: Darlin Coco MD  No chief complaint on file.     History of Present Illness: JUBILEE VIVERO is a 69 y.o. female who presents for a follow-up office visit  This pleasant 69 year old woman is seen for a  followup office visit.  She was last seen 3 years ago.  She has a history of hypercholesterolemia and a history of hypothyroidism. She's had a past history of palpitations. She does not have any history of ischemic heart disease and she had normal nuclear stress test in 2007. She had an echocardiogram in December 2008 which showed an ejection fraction of 60-65% with impaired relaxation and normal LV systolic function.  She has been experiencing some intermittent twitching of her right eye.  At times she has decreased vision.  She saw her optometrist about a month ago who suggested that perhaps she had some inflammation of the eye. History of occasional bouts of paroxysmal supraventricular tachycardia.  She's had one episode in the past 6 months which lasted about 2 hours and responded to extra metoprolol. 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 She has not been getting as much intentional aerobic exercise.  She has been busy caring for 2 grandchildren ages 59 years old and 76 years old. She has lost 2 pounds since last visit.  She has not been too careful with her diet.  We are checking labs today.  She is intolerant of statins and is on fenofibrate.  The last fenofibrate she got was a larger size and she does not like it is well because it is difficult to swallow.   Past Medical History  Diagnosis Date  . Hypercholesterolemia   . Back pain   . Generalized headaches   . Colitis   . Hypothyroidism   . Asthma   . Diverticulitis   . Allergic rhinitis, cause unspecified   . GERD (gastroesophageal reflux disease)      Past Surgical History  Procedure Laterality Date  . Back surgery    . Colonoscopy    . Breast biopsy  1982  . Tonsillectomy  1953  . Abdominal hysterectomy  1983  . Appendectomy  1955  . Eye surgery  11/14/2009, 1990    blocked tear duct repair     Current Outpatient Prescriptions  Medication Sig Dispense Refill  . ADVAIR DISKUS 250-50 MCG/DOSE AEPB USE 1 INHALATION EVERY 12 HOURS. 3 each 1  . aspirin 81 MG tablet Take 81 mg by mouth as needed (blood thinner).     . conjugated estrogens (PREMARIN) vaginal cream Place vaginally 3 (three) times a week. 42.5 g 12  . fenofibrate 160 MG tablet Take 1 tablet (160 mg total) by mouth daily. 90 tablet 2  . fluticasone (FLONASE) 50 MCG/ACT nasal spray Place 2 sprays into both nostrils daily as needed for allergies or rhinitis (allergies).    Marland Kitchen levothyroxine (SYNTHROID, LEVOTHROID) 75 MCG tablet TAKE 1 TABLET DAILY 90 tablet 2  . loratadine (CLARITIN) 10 MG tablet Take 10 mg by mouth daily as needed for allergies (allergies).    . metoprolol succinate (TOPROL-XL) 25 MG 24 hr tablet Take 0.5 tablets (12.5 mg total) by mouth daily. 45 tablet 1  . PROAIR HFA 108 (90 BASE) MCG/ACT inhaler INHALE 2 PUFFS EVERY 4 HOURS AS NEEDED FOR WHEEZING OR SHORTNESS OF  BREATH 3 each 2   No current facility-administered medications for this visit.    Allergies:   Crestor; Lipitor; Phenergan; Pravachol; Promethazine hcl; Sulfa antibiotics; Zetia; and Zocor    Social History:  The patient  reports that she has never smoked. She has never used smokeless tobacco. She reports that she does not drink alcohol or use illicit drugs.   Family History:  The patient's family history includes Arthritis in her father and mother; Cancer in her mother; Hyperlipidemia in her father and mother.    ROS:  Please see the history of present illness.   Otherwise, review of systems are positive for none.   All other systems are reviewed and negative.    PHYSICAL EXAM: VS:   BP 130/62 mmHg  Pulse 67  Ht 5' 2.5" (1.588 m)  Wt 129 lb 12.8 oz (58.877 kg)  BMI 23.35 kg/m2 , BMI Body mass index is 23.35 kg/(m^2). GEN: Well nourished, well developed, in no acute distress HEENT: normal Neck: no JVD, carotid bruits, or masses Cardiac: RRR; no murmurs, rubs, or gallops,no edema  Respiratory:  clear to auscultation bilaterally, normal work of breathing GI: soft, nontender, nondistended, + BS MS: no deformity or atrophy Skin: warm and dry, no rash Neuro:  Strength and sensation are intact Psych: euthymic mood, full affect   EKG:  EKG is ordered today. The ekg ordered today demonstrates normal sinus rhythm.  Within normal limits.   Recent Labs: 01/25/2014: BUN 17; Creatinine, Ser 0.8; Hemoglobin 14.6; Platelets 308.0; Potassium 4.3; Sodium 139; TSH 1.60    Lipid Panel    Component Value Date/Time   CHOL 251* 01/25/2014 1139   TRIG 50.0 01/25/2014 1139   HDL 104.10 01/25/2014 1139   CHOLHDL 2 01/25/2014 1139   VLDL 10.0 01/25/2014 1139   LDLCALC 137* 01/25/2014 1139   LDLDIRECT 94.2 07/28/2012 1010      Wt Readings from Last 3 Encounters:  09/22/14 129 lb 12.8 oz (58.877 kg)  03/28/14 131 lb 6 oz (59.591 kg)  01/25/14 125 lb 12.8 oz (57.063 kg)       ASSESSMENT AND PLAN:  1.  Hypercholesterolemia 2. Palpitations. 3.  Hypothyroidism 4.  Intermittent twitching of right eye.  Question eye inflammation.  Current medicines are reviewed at length with the patient today.  The patient does not have concerns regarding medicines.  The following changes have been made:  no change  Labs/ tests ordered today include:   Orders Placed This Encounter  Procedures  . CBC with Differential/Platelet  . Lipid panel  . Hepatic function panel  . Basic metabolic panel  . Sed Rate (ESR)  . C-reactive protein  . TSH  . T4, free  . EKG 12-Lead     Disposition: We will get extensive lab work today including lipid panel hepatic function panel CBC and basal  metabolic panel TSH and free T4.  We will also get a sedimentation rate and a C-reactive protein to rule out temporal arteritis. Recheck in 6 months for follow-up office visit.  Continue current medication.  Berna Spare MD 09/22/2014 8:48 AM    [address], [place], [place]  [address] Phone: [phone]; Fax: [phone]

## 2014-09-25 NOTE — Progress Notes (Signed)
Quick Note:  Please report to patient. The recent labs are stable. Continue same medication and careful diet. Thyroid normal. No anemia. The tests for inflammation are normal. ______

## 2015-01-01 ENCOUNTER — Other Ambulatory Visit: Payer: Self-pay | Admitting: Internal Medicine

## 2015-01-09 ENCOUNTER — Encounter: Payer: Self-pay | Admitting: Internal Medicine

## 2015-01-09 ENCOUNTER — Ambulatory Visit (INDEPENDENT_AMBULATORY_CARE_PROVIDER_SITE_OTHER): Payer: Medicare Other | Admitting: Internal Medicine

## 2015-01-09 VITALS — BP 120/70 | HR 93 | Temp 98.8°F | Resp 16 | Ht 63.0 in | Wt 130.1 lb

## 2015-01-09 DIAGNOSIS — B349 Viral infection, unspecified: Secondary | ICD-10-CM

## 2015-01-09 MED ORDER — LIDOCAINE VISCOUS 2 % MT SOLN: 20.0000 mL | OROMUCOSAL | Status: DC | PRN

## 2015-01-09 MED ORDER — FLUTICASONE PROPIONATE 50 MCG/ACT NA SUSP: 2.0000 | Freq: Every day | NASAL | Status: DC | PRN

## 2015-01-09 NOTE — Progress Notes (Signed)
   Subjective:    Patient ID: Elizabeth Mills, female    DOB: May 04, 1945, 69 y.o.   MRN: [id number]  HPI The patient is a 69 YO female coming in for sore throat. She has been having trouble with allergies for the last 3 weeks or so. This was getting better until she was around her husband last week who was sick. She thinks that he got her sick and she started feeling worse on Sunday. Mild fever on Monday and more nasal congestion. Some drainage in her throat and it is sore and feels raw. Has tried some mucinex but stopped using that. No problems with her breathing and mild cough only. Overall feels stable from Sunday.   Review of Systems  Constitutional: Positive for fever. Negative for chills, activity change, appetite change and unexpected weight change.  HENT: Positive for congestion, postnasal drip and rhinorrhea. Negative for drooling, ear discharge, ear pain, nosebleeds, sinus pressure, sore throat and trouble swallowing.   Eyes: Negative.   Respiratory: Positive for cough. Negative for chest tightness, shortness of breath and wheezing.   Cardiovascular: Negative.   Gastrointestinal: Negative.   Musculoskeletal: Negative.   Neurological: Negative.       Objective:   Physical Exam  Constitutional: She is oriented to person, place, and time. She appears well-developed and well-nourished.  HENT:  Head: Normocephalic and atraumatic.  Left and right ear normal, oropharynx with mild clear drainage and redness, no purulent discharge.   Eyes: EOM are normal.  Neck: Normal range of motion.  Cardiovascular: Normal rate and regular rhythm.   Pulmonary/Chest: Effort normal and breath sounds normal. No respiratory distress. She has no wheezes. She has no rales.  Abdominal: Soft.  Lymphadenopathy:    She has no cervical adenopathy.  Neurological: She is alert and oriented to person, place, and time. Coordination normal.  Skin: Skin is warm and dry.   Filed Vitals:   01/09/15 1401  BP:  120/70  Pulse: 93  Temp: 98.8 F (37.1 C)  TempSrc: Oral  Resp: 16  Height: 5\' 3"  (1.6 m)  Weight: 130 lb 1.9 oz (59.022 kg)  SpO2: 96%      Assessment & Plan:

## 2015-01-09 NOTE — Progress Notes (Signed)
Pre visit review using our clinic review tool, if applicable. No additional management support is needed unless otherwise documented below in the visit note. 

## 2015-01-09 NOTE — Assessment & Plan Note (Signed)
Rx for lidocaine viscous for her sore throat. Advised to resume flonase for her nasal symptoms. Will call back if no improvement on Friday (she is going out of town this weekend).

## 2015-01-09 NOTE — Patient Instructions (Addendum)
We have sent in lidocaine liquid that you can swish in your mouth and swallow to help with the throat pain. You can use it 5 mL up to every 3 hours as needed.   If you are not starting to feel better by Friday call us and we can call in some antibiotics. You can start using the flonase again and we have sent in a refill. 2 sprays each nostril once a day for the next 1-2 weeks.   Viral Infections A virus is a type of germ. Viruses can cause:  Minor sore throats.  Aches and pains.  Headaches.  Runny nose.  Rashes.  Watery eyes.  Tiredness.  Coughs.  Loss of appetite.  Feeling sick to your stomach (nausea).  Throwing up (vomiting).  Watery poop (diarrhea). HOME CARE   Only take medicines as told by your doctor.  Drink enough water and fluids to keep your pee (urine) clear or pale yellow. Sports drinks are a good choice.  Get plenty of rest and eat healthy. Soups and broths with crackers or rice are fine. GET HELP RIGHT AWAY IF:   You have a very bad headache.  You have shortness of breath.  You have chest pain or neck pain.  You have an unusual rash.  You cannot stop throwing up.  You have watery poop that does not stop.  You cannot keep fluids down.  You or your child has a temperature by mouth above 102 F (38.9 C), not controlled by medicine.  Your baby is older than 3 months with a rectal temperature of 102 F (38.9 C) or higher.  Your baby is 313 months old or younger with a rectal temperature of 100.4 F (38 C) or higher. MAKE SURE YOU:   Understand these instructions.  Will watch this condition.  Will get help right away if you are not doing well or get worse.   This information is not intended to replace advice given to you by your health care provider. Make sure you discuss any questions you have with your health care provider.   Document Released: 02/21/2008 Document Revised: 06/02/2011 Document Reviewed: 08/16/2014 Elsevier Interactive  Patient Education [employer]2016 [employer] Inc.

## 2015-01-10 ENCOUNTER — Telehealth: Payer: Self-pay | Admitting: Internal Medicine

## 2015-01-10 ENCOUNTER — Other Ambulatory Visit: Payer: Self-pay | Admitting: Geriatric Medicine

## 2015-01-10 MED ORDER — LEVOFLOXACIN 500 MG PO TABS: 500.0000 mg | ORAL_TABLET | Freq: Every day | ORAL | Status: DC

## 2015-01-10 NOTE — Telephone Encounter (Signed)
Spoke with Tammy SoursGreg - Levoquin 500 mg x 7 days once daily.

## 2015-01-10 NOTE — Telephone Encounter (Signed)
Pt called in said that she is still not better.  She is still has fever and wants to know if something can be called in?      Best number [phone]

## 2015-01-10 NOTE — Telephone Encounter (Signed)
Patient aware and will go pick up 

## 2015-02-05 ENCOUNTER — Other Ambulatory Visit: Payer: Self-pay | Admitting: Internal Medicine

## 2015-02-05 MED ORDER — ALBUTEROL SULFATE HFA 108 (90 BASE) MCG/ACT IN AERS
INHALATION_SPRAY | RESPIRATORY_TRACT | Status: DC
Start: 2015-02-05 — End: 2015-10-23

## 2015-02-05 NOTE — Telephone Encounter (Signed)
Left msg on triage pt states [facility]. Refills has been sent.../lm,b

## 2015-02-20 ENCOUNTER — Ambulatory Visit (INDEPENDENT_AMBULATORY_CARE_PROVIDER_SITE_OTHER): Payer: Medicare Other | Admitting: Internal Medicine

## 2015-02-20 ENCOUNTER — Encounter: Payer: Self-pay | Admitting: Internal Medicine

## 2015-02-20 VITALS — BP 110/62 | HR 78 | Temp 98.0°F | Ht 63.0 in | Wt 131.0 lb

## 2015-02-20 DIAGNOSIS — R062 Wheezing: Secondary | ICD-10-CM | POA: Diagnosis not present

## 2015-02-20 DIAGNOSIS — R05 Cough: Secondary | ICD-10-CM | POA: Diagnosis not present

## 2015-02-20 DIAGNOSIS — J4520 Mild intermittent asthma, uncomplicated: Secondary | ICD-10-CM | POA: Diagnosis not present

## 2015-02-20 DIAGNOSIS — J309 Allergic rhinitis, unspecified: Secondary | ICD-10-CM

## 2015-02-20 DIAGNOSIS — R059 Cough, unspecified: Secondary | ICD-10-CM

## 2015-02-20 MED ORDER — PREDNISONE 10 MG PO TABS: ORAL_TABLET | ORAL | Status: DC

## 2015-02-20 MED ORDER — HYDROCODONE-HOMATROPINE 5-1.5 MG/5ML PO SYRP: 5.0000 mL | ORAL_SOLUTION | Freq: Four times a day (QID) | ORAL | Status: DC | PRN

## 2015-02-20 MED ORDER — METHYLPREDNISOLONE ACETATE 80 MG/ML IJ SUSP
80.0000 mg | Freq: Once | INTRAMUSCULAR | Status: AC
  Administered 2015-02-20: 80 mg via INTRAMUSCULAR

## 2015-02-20 MED ORDER — LEVOFLOXACIN 500 MG PO TABS: 500.0000 mg | ORAL_TABLET | Freq: Every day | ORAL | Status: DC

## 2015-02-20 NOTE — Progress Notes (Signed)
Pre visit review using our clinic review tool, if applicable. No additional management support is needed unless otherwise documented below in the visit note. 

## 2015-02-20 NOTE — Progress Notes (Signed)
Subjective:    Patient ID: Elizabeth Mills, female    DOB: [birth year], 69 y.o.   MRN: [id number]  HPI  Here with acute onset mild to mod 2-3 days ST, HA, general weakness and malaise, with prod cough greenish sputum, but Pt denies chest pain, increased sob or doe, wheezing, orthopnea, PND, increased LE swelling, palpitations, dizziness or syncope, except for onset wheezing and mild sob last PM.  Pt denies new neurological symptoms such as new headache, or facial or extremity weakness or numbness   Pt denies polydipsia, polyuria,Current home med tx not helping the wheezing. Does have several wks ongoing nasal allergy symptoms with clearish congestion, itch and sneezing, without fever, pain, ST, cough, swelling but better with flonase re-start.  Has been using the proair three times daily for last 2 days Past Medical History  Diagnosis Date  . Hypercholesterolemia   . Back pain   . Generalized headaches   . Colitis   . Hypothyroidism   . Asthma   . Diverticulitis   . Allergic rhinitis, cause unspecified   . GERD (gastroesophageal reflux disease)    Past Surgical History  Procedure Laterality Date  . Back surgery    . Colonoscopy    . Breast biopsy  1982  . Tonsillectomy  1953  . Abdominal hysterectomy  1983  . Appendectomy  1955  . Eye surgery  11/14/2009, 1990    blocked tear duct repair    reports that she has never smoked. She has never used smokeless tobacco. She reports that she does not drink alcohol or use illicit drugs. family history includes Arthritis in her father and mother; Cancer in her mother; Hyperlipidemia in her father and mother. Allergies  Allergen Reactions  . Crestor [Rosuvastatin Calcium] Other (See Comments)    Gi issues  . Lipitor [Atorvastatin Calcium]     Gi symp  . Phenergan [Promethazine Hcl] Other (See Comments)    Father and son are highly allergic so patient doesn't take it  . Pravachol Other (See Comments)    Gi issues  . Promethazine Hcl   .  Sulfa Antibiotics Other (See Comments)    dizziness  . Zetia [Ezetimibe] Other (See Comments)    Gi issues  . Zocor [Simvastatin - High Dose] Other (See Comments)    Gi issues   Current Outpatient Prescriptions on File Prior to Visit  Medication Sig Dispense Refill  . ADVAIR DISKUS 250-50 MCG/DOSE AEPB USE 1 INHALATION EVERY 12 HOURS 180 each 0  . albuterol (PROAIR HFA) 108 (90 BASE) MCG/ACT inhaler INHALE 2 PUFFS EVERY 4 HOURS AS NEEDED FOR WHEEZING OR SHORTNESS OF BREATH 3 each 0  . aspirin 81 MG tablet Take 81 mg by mouth as needed (blood thinner).     . conjugated estrogens (PREMARIN) vaginal cream Place vaginally 3 (three) times a week. 42.5 g 12  . fenofibrate 160 MG tablet Take 1 tablet (160 mg total) by mouth daily. 90 tablet 2  . fluticasone (FLONASE) 50 MCG/ACT nasal spray Place 2 sprays into both nostrils daily as needed for allergies or rhinitis (allergies). 16 g 3  . levothyroxine (SYNTHROID, LEVOTHROID) 75 MCG tablet TAKE 1 TABLET DAILY 90 tablet 2  . loratadine (CLARITIN) 10 MG tablet Take 10 mg by mouth daily as needed for allergies (allergies).    . metoprolol succinate (TOPROL-XL) 25 MG 24 hr tablet TAKE ONE-HALF (1/2) TABLET DAILY 45 tablet 1  . lidocaine (XYLOCAINE) 2 % solution Use as directed 20 mLs in  the mouth or throat as needed for mouth pain. (Patient not taking: Reported on 02/20/2015) 100 mL 0   No current facility-administered medications on file prior to visit.   Review of Systems  Constitutional: Negative for unusual diaphoresis or night sweats HENT: Negative for ringing in ear or discharge Eyes: Negative for double vision or worsening visual disturbance.  Respiratory: Negative for choking and stridor.   Gastrointestinal: Negative for vomiting or other signifcant bowel change Genitourinary: Negative for hematuria or change in urine volume.  Musculoskeletal: Negative for other MSK pain or swelling Skin: Negative for color change and worsening wound.    Neurological: Negative for tremors and numbness other than noted  Psychiatric/Behavioral: Negative for decreased concentration or agitation other than above       Objective:   Physical Exam BP 110/62 mmHg  Pulse 78  Temp(Src) 98 F (36.7 C) (Oral)  Ht 5\' 3"  (1.6 m)  Wt 131 lb (59.421 kg)  BMI 23.21 kg/m2  SpO2 95% VS noted, mild ill Constitutional: Pt appears in no significant distress HENT: Head: NCAT.  Right Ear: External ear normal.  Left Ear: External ear normal.  Bilat tm's with mild erythema.  Max sinus areas non tender.  Pharynx with mild erythema, no exudate Eyes: . Pupils are equal, round, and reactive to light. Conjunctivae and EOM are normal Neck: Normal range of motion. Neck supple.  Cardiovascular: Normal rate and regular rhythm.   Pulmonary/Chest: Effort normal and breath sounds without rales but with few bilat wheezing, decreased BS, no accessory muscle use Neurological: Pt is alert. Not confused , motor grossly intact Skin: Skin is warm. No rash, no LE edema Psychiatric: Pt behavior is normal. No agitation.     Assessment & Plan:

## 2015-02-20 NOTE — Patient Instructions (Signed)
You had the steroid shot today  Please take all new medication as prescribed - the antibiotic, cough medicine as needed, and the prednisone  Please continue all other medications as before, including the Proair inhalers  Please have the pharmacy call with any other refills you may need.  Please keep your appointments with your specialists as you may have planned

## 2015-02-21 NOTE — Assessment & Plan Note (Signed)
Mild to mod, for antibx course,  to f/u any worsening symptoms or concerns, for cough med prn as well 

## 2015-02-21 NOTE — Assessment & Plan Note (Signed)
C/w likely bronchospasm related to infectiuos in the setting of prior asthma, for depomedrol IM, predpac asd,  to f/u any worsening symptoms or concerns

## 2015-02-21 NOTE — Assessment & Plan Note (Signed)
stable overall by history and exam, and pt to continue medical treatment as before,  to f/u any worsening symptoms or concerns 

## 2015-02-21 NOTE — Assessment & Plan Note (Signed)
O/w stable prior to onset current illness, cont current med tx,  to f/u any worsening symptoms or concerns

## 2015-03-08 ENCOUNTER — Other Ambulatory Visit: Payer: Self-pay | Admitting: Internal Medicine

## 2015-03-22 ENCOUNTER — Ambulatory Visit (INDEPENDENT_AMBULATORY_CARE_PROVIDER_SITE_OTHER): Payer: Medicare Other | Admitting: Internal Medicine

## 2015-03-22 ENCOUNTER — Other Ambulatory Visit (INDEPENDENT_AMBULATORY_CARE_PROVIDER_SITE_OTHER): Payer: Medicare Other

## 2015-03-22 ENCOUNTER — Encounter: Payer: Self-pay | Admitting: Internal Medicine

## 2015-03-22 VITALS — BP 124/78 | HR 67 | Temp 98.0°F | Resp 18 | Wt 130.0 lb

## 2015-03-22 DIAGNOSIS — Z0000 Encounter for general adult medical examination without abnormal findings: Secondary | ICD-10-CM

## 2015-03-22 DIAGNOSIS — Z13820 Encounter for screening for osteoporosis: Secondary | ICD-10-CM

## 2015-03-22 DIAGNOSIS — J45901 Unspecified asthma with (acute) exacerbation: Secondary | ICD-10-CM | POA: Diagnosis not present

## 2015-03-22 DIAGNOSIS — R002 Palpitations: Secondary | ICD-10-CM

## 2015-03-22 DIAGNOSIS — Z0001 Encounter for general adult medical examination with abnormal findings: Secondary | ICD-10-CM

## 2015-03-22 DIAGNOSIS — E039 Hypothyroidism, unspecified: Secondary | ICD-10-CM

## 2015-03-22 DIAGNOSIS — E7800 Pure hypercholesterolemia, unspecified: Secondary | ICD-10-CM | POA: Diagnosis not present

## 2015-03-22 DIAGNOSIS — Z23 Encounter for immunization: Secondary | ICD-10-CM

## 2015-03-22 LAB — CBC WITH DIFFERENTIAL/PLATELET
BASOS PCT: 0.5 % (ref 0.0–3.0)
Basophils Absolute: 0 10*3/uL (ref 0.0–0.1)
EOS ABS: 0.3 10*3/uL (ref 0.0–0.7)
EOS PCT: 6.2 % — AB (ref 0.0–5.0)
HCT: 42.2 % (ref 36.0–46.0)
Hemoglobin: 14.4 g/dL (ref 12.0–15.0)
LYMPHS PCT: 32 % (ref 12.0–46.0)
Lymphs Abs: 1.8 10*3/uL (ref 0.7–4.0)
MCHC: 34.1 g/dL (ref 30.0–36.0)
MCV: 89.6 fl (ref 78.0–100.0)
MONO ABS: 0.5 10*3/uL (ref 0.1–1.0)
Monocytes Relative: 9 % (ref 3.0–12.0)
NEUTROS PCT: 52.3 % (ref 43.0–77.0)
Neutro Abs: 2.9 10*3/uL (ref 1.4–7.7)
PLATELETS: 334 10*3/uL (ref 150.0–400.0)
RBC: 4.71 Mil/uL (ref 3.87–5.11)
RDW: 14 % (ref 11.5–15.5)
WBC: 5.6 10*3/uL (ref 4.0–10.5)

## 2015-03-22 LAB — LIPID PANEL
CHOLESTEROL: 235 mg/dL — AB (ref 0–200)
HDL: 102.6 mg/dL (ref >39.00)
LDL CALC: 121 mg/dL — AB (ref 0–99)
NonHDL: 132.4
TRIGLYCERIDES: 57 mg/dL (ref 0.0–149.0)
Total CHOL/HDL Ratio: 2
VLDL: 11.4 mg/dL (ref 0.0–40.0)

## 2015-03-22 LAB — COMPREHENSIVE METABOLIC PANEL
ALK PHOS: 41 U/L (ref 39–117)
ALT: 14 U/L (ref 0–35)
AST: 18 U/L (ref 0–37)
Albumin: 4.1 g/dL (ref 3.5–5.2)
BUN: 17 mg/dL (ref 6–23)
CO2: 30 mEq/L (ref 19–32)
CREATININE: 0.75 mg/dL (ref 0.40–1.20)
Calcium: 9.5 mg/dL (ref 8.4–10.5)
Chloride: 102 mEq/L (ref 96–112)
GFR: 81.33 mL/min (ref >60.00)
GLUCOSE: 93 mg/dL (ref 70–99)
POTASSIUM: 3.8 meq/L (ref 3.5–5.1)
SODIUM: 139 meq/L (ref 135–145)
TOTAL PROTEIN: 7.8 g/dL (ref 6.0–8.3)
Total Bilirubin: 0.5 mg/dL (ref 0.2–1.2)

## 2015-03-22 LAB — TSH: TSH: 1.03 u[IU]/mL (ref 0.35–4.50)

## 2015-03-22 MED ORDER — HYDROCODONE-HOMATROPINE 5-1.5 MG/5ML PO SYRP: 5.0000 mL | ORAL_SOLUTION | Freq: Four times a day (QID) | ORAL | Status: DC | PRN

## 2015-03-22 MED ORDER — PREDNISONE 20 MG PO TABS: 40.0000 mg | ORAL_TABLET | Freq: Every day | ORAL | Status: DC

## 2015-03-22 NOTE — Progress Notes (Signed)
Pre visit review using our clinic review tool, if applicable. No additional management support is needed unless otherwise documented below in the visit note. 

## 2015-03-22 NOTE — Assessment & Plan Note (Signed)
Check TSH-we'll titrate medication dose if needed

## 2015-03-22 NOTE — Assessment & Plan Note (Signed)
Fairly controlled with metoprolol, but she does have occasional palpitations-typically they are transient Following with cardiology Continue metoprolol 25 mg daily

## 2015-03-22 NOTE — Patient Instructions (Addendum)
[name] , Thank you for taking time to come for your Medicare Wellness Visit. I appreciate your ongoing commitment to your health goals. Please review the following plan we discussed and let me know if I can assist you in the future.   These are the goals we discussed: Goals    None      This is a list of the screening recommended for you and due dates:  Health Maintenance  Topic Date Due  .  Hepatitis C: One time screening is recommended by Center for Disease Control  (CDC) for  adults born from [birth year] through 1965.   01/06/1946  . DEXA scan (bone density measurement)  10/14/2010  . Pneumonia vaccines (2 of 2 - PPSV23) 01/26/2015  . Flu Shot  03/25/2015*  . Mammogram  07/15/2015  . Colon Cancer Screening  05/22/2020  . Tetanus Vaccine  12/31/2023  . Shingles Vaccine  Addressed  *Topic was postponed. The date shown is not the original due date.     . We have reviewed your prior records including labs and tests today.  Test(s) ordered today. Your results will be released to [facility] (or called to you) after review, usually within 72hours after test completion. If any changes need to be made, you will be notified at that same time.  All other Health Maintenance issues reviewed.   All recommended immunizations and age-appropriate screenings are up-to-date.   pneumonia administered today.   Medications reviewed and updated.  We will start a 5 day course of prednisone for the asthma.    Your prescription(s) have been submitted to your pharmacy. Please take as directed and contact our office if you believe you are having problem(s) with the medication(s).  A bone density was ordered.  We will call you to schedule this.   Health Maintenance, Female Adopting a healthy lifestyle and getting preventive care can go a long way to promote health and wellness. Talk with your health care provider about what schedule of regular examinations is right for you. This is a good chance for you to  check in with your provider about disease prevention and staying healthy. In between checkups, there are plenty of things you can do on your own. Experts have done a lot of research about which lifestyle changes and preventive measures are most likely to keep you healthy. Ask your health care provider for more information. WEIGHT AND DIET  Eat a healthy diet  Be sure to include plenty of vegetables, fruits, low-fat dairy products, and lean protein.  Do not eat a lot of foods high in solid fats, added sugars, or salt.  Get regular exercise. This is one of the most important things you can do for your health.  Most adults should exercise for at least 150 minutes each week. The exercise should increase your heart rate and make you sweat (moderate-intensity exercise).  Most adults should also do strengthening exercises at least twice a week. This is in addition to the moderate-intensity exercise.  Maintain a healthy weight  Body mass index (BMI) is a measurement that can be used to identify possible weight problems. It estimates body fat based on height and weight. Your health care provider can help determine your BMI and help you achieve or maintain a healthy weight.  For females 39 years of age and older:   A BMI below 18.5 is considered underweight.  A BMI of 18.5 to 24.9 is normal.  A BMI of 25 to 29.9 is considered overweight.  A BMI of 30 and above is considered obese.  Watch levels of cholesterol and blood lipids  You should start having your blood tested for lipids and cholesterol at 69 years of age, then have this test every 5 years.  You may need to have your cholesterol levels checked more often if:  Your lipid or cholesterol levels are high.  You are older than 68 years of age.  You are at high risk for heart disease.  CANCER SCREENING   Lung Cancer  Lung cancer screening is recommended for adults 21-55 years old who are at high risk for lung cancer because of a  history of smoking.  A yearly low-dose CT scan of the lungs is recommended for people who:  Currently smoke.  Have quit within the past 15 years.  Have at least a 30-pack-year history of smoking. A pack year is smoking an average of one pack of cigarettes a day for 1 year.  Yearly screening should continue until it has been 15 years since you quit.  Yearly screening should stop if you develop a health problem that would prevent you from having lung cancer treatment.  Breast Cancer  Practice breast self-awareness. This means understanding how your breasts normally appear and feel.  It also means doing regular breast self-exams. Let your health care provider know about any changes, no matter how small.  If you are in your 20s or 30s, you should have a clinical breast exam (CBE) by a health care provider every 1-3 years as part of a regular health exam.  If you are 62 or older, have a CBE every year. Also consider having a breast X-ray (mammogram) every year.  If you have a family history of breast cancer, talk to your health care provider about genetic screening.  If you are at high risk for breast cancer, talk to your health care provider about having an MRI and a mammogram every year.  Breast cancer gene (BRCA) assessment is recommended for women who have family members with BRCA-related cancers. BRCA-related cancers include:  Breast.  Ovarian.  Tubal.  Peritoneal cancers.  Results of the assessment will determine the need for genetic counseling and BRCA1 and BRCA2 testing. Cervical Cancer Your health care provider may recommend that you be screened regularly for cancer of the pelvic organs (ovaries, uterus, and vagina). This screening involves a pelvic examination, including checking for microscopic changes to the surface of your cervix (Pap test). You may be encouraged to have this screening done every 3 years, beginning at age 55.  For women ages 45-65, health care  providers may recommend pelvic exams and Pap testing every 3 years, or they may recommend the Pap and pelvic exam, combined with testing for human papilloma virus (HPV), every 5 years. Some types of HPV increase your risk of cervical cancer. Testing for HPV may also be done on women of any age with unclear Pap test results.  Other health care providers may not recommend any screening for nonpregnant women who are considered low risk for pelvic cancer and who do not have symptoms. Ask your health care provider if a screening pelvic exam is right for you.  If you have had past treatment for cervical cancer or a condition that could lead to cancer, you need Pap tests and screening for cancer for at least 20 years after your treatment. If Pap tests have been discontinued, your risk factors (such as having a new sexual partner) need to be reassessed to determine  if screening should resume. Some women have medical problems that increase the chance of getting cervical cancer. In these cases, your health care provider may recommend more frequent screening and Pap tests. Colorectal Cancer  This type of cancer can be detected and often prevented.  Routine colorectal cancer screening usually begins at 69 years of age and continues through 69 years of age.  Your health care provider may recommend screening at an earlier age if you have risk factors for colon cancer.  Your health care provider may also recommend using home test kits to check for hidden blood in the stool.  A small camera at the end of a tube can be used to examine your colon directly (sigmoidoscopy or colonoscopy). This is done to check for the earliest forms of colorectal cancer.  Routine screening usually begins at age 65.  Direct examination of the colon should be repeated every 5-10 years through 69 years of age. However, you may need to be screened more often if early forms of precancerous polyps or small growths are found. Skin  Cancer  Check your skin from head to toe regularly.  Tell your health care provider about any new moles or changes in moles, especially if there is a change in a mole's shape or color.  Also tell your health care provider if you have a mole that is larger than the size of a pencil eraser.  Always use sunscreen. Apply sunscreen liberally and repeatedly throughout the day.  Protect yourself by wearing long sleeves, pants, a wide-brimmed hat, and sunglasses whenever you are outside. HEART DISEASE, DIABETES, AND HIGH BLOOD PRESSURE   High blood pressure causes heart disease and increases the risk of stroke. High blood pressure is more likely to develop in:  People who have blood pressure in the high end of the normal range (130-139/85-89 mm Hg).  People who are overweight or obese.  People who are African American.  If you are 3-37 years of age, have your blood pressure checked every 3-5 years. If you are 47 years of age or older, have your blood pressure checked every year. You should have your blood pressure measured twice--once when you are at a hospital or clinic, and once when you are not at a hospital or clinic. Record the average of the two measurements. To check your blood pressure when you are not at a hospital or clinic, you can use:  An automated blood pressure machine at a pharmacy.  A home blood pressure monitor.  If you are between 43 years and 70 years old, ask your health care provider if you should take aspirin to prevent strokes.  Have regular diabetes screenings. This involves taking a blood sample to check your fasting blood sugar level.  If you are at a normal weight and have a low risk for diabetes, have this test once every three years after 69 years of age.  If you are overweight and have a high risk for diabetes, consider being tested at a younger age or more often. PREVENTING INFECTION  Hepatitis B  If you have a higher risk for hepatitis B, you should be  screened for this virus. You are considered at high risk for hepatitis B if:  You were born in a country where hepatitis B is common. Ask your health care provider which countries are considered high risk.  Your parents were born in a high-risk country, and you have not been immunized against hepatitis B (hepatitis B vaccine).  You have  HIV or AIDS.  You use needles to inject street drugs.  You live with someone who has hepatitis B.  You have had sex with someone who has hepatitis B.  You get hemodialysis treatment.  You take certain medicines for conditions, including cancer, organ transplantation, and autoimmune conditions. Hepatitis C  Blood testing is recommended for:  Everyone born from [birth year] through 1965.  Anyone with known risk factors for hepatitis C. Sexually transmitted infections (STIs)  You should be screened for sexually transmitted infections (STIs) including gonorrhea and chlamydia if:  You are sexually active and are younger than 69 years of age.  You are older than 69 years of age and your health care provider tells you that you are at risk for this type of infection.  Your sexual activity has changed since you were last screened and you are at an increased risk for chlamydia or gonorrhea. Ask your health care provider if you are at risk.  If you do not have HIV, but are at risk, it may be recommended that you take a prescription medicine daily to prevent HIV infection. This is called pre-exposure prophylaxis (PrEP). You are considered at risk if:  You are sexually active and do not regularly use condoms or know the HIV status of your partner(s).  You take drugs by injection.  You are sexually active with a partner who has HIV. Talk with your health care provider about whether you are at high risk of being infected with HIV. If you choose to begin PrEP, you should first be tested for HIV. You should then be tested every 3 months for as long as you are taking  PrEP.  PREGNANCY   If you are premenopausal and you may become pregnant, ask your health care provider about preconception counseling.  If you may become pregnant, take 400 to 800 micrograms (mcg) of folic acid every day.  If you want to prevent pregnancy, talk to your health care provider about birth control (contraception). OSTEOPOROSIS AND MENOPAUSE   Osteoporosis is a disease in which the bones lose minerals and strength with aging. This can result in serious bone fractures. Your risk for osteoporosis can be identified using a bone density scan.  If you are 49 years of age or older, or if you are at risk for osteoporosis and fractures, ask your health care provider if you should be screened.  Ask your health care provider whether you should take a calcium or vitamin D supplement to lower your risk for osteoporosis.  Menopause may have certain physical symptoms and risks.  Hormone replacement therapy may reduce some of these symptoms and risks. Talk to your health care provider about whether hormone replacement therapy is right for you.  HOME CARE INSTRUCTIONS   Schedule regular health, dental, and eye exams.  Stay current with your immunizations.   Do not use any tobacco products including cigarettes, chewing tobacco, or electronic cigarettes.  If you are pregnant, do not drink alcohol.  If you are breastfeeding, limit how much and how often you drink alcohol.  Limit alcohol intake to no more than 1 drink per day for nonpregnant women. One drink equals 12 ounces of beer, 5 ounces of wine, or 1 ounces of hard liquor.  Do not use street drugs.  Do not share needles.  Ask your health care provider for help if you need support or information about quitting drugs.  Tell your health care provider if you often feel depressed.  Tell your health care  provider if you have ever been abused or do not feel safe at home.   This information is not intended to replace advice given  to you by your health care provider. Make sure you discuss any questions you have with your health care provider.   Document Released: 09/23/2010 Document Revised: 03/31/2014 Document Reviewed: 02/09/2013 Elsevier Interactive Patient Education 2016 Elsevier [place] out [place].

## 2015-03-22 NOTE — Assessment & Plan Note (Signed)
Taking fenofibrate 160 mg daily check lipid panel Continue regular exercise

## 2015-03-22 NOTE — Progress Notes (Signed)
Subjective:    Patient ID: Elizabeth Mills, female    DOB: 02/20/1946, 69 y.o.   MRN: [id number]  HPI She is here to establish with a new pcp.  She is here for a wellness visit.  Residual cold symptoms: Still wheezing especially with laying down and during the day.  She has coughed up mostly clear phlegm, occ yellow phlem. She is needing to use her rescue inhaler three times a day.  She denies any nasal congestion, sore throat, ear pain and sinus pain. The past couple weeks she has not seen any improvement  Here for medicare wellness.   I have personally reviewed and have noted 1.The patient's medical and social history 2.Their use of alcohol, tobacco or illicit drugs 3.Their current medications and supplements 4.The patient's functional ability including ADL's, fall risks, home safety risks and hearing or visual impairment. 5.Diet and physical activities 6.Evidence for depression or mood disorders 7.Care team reviewed and updated (available in snapshot)   Are there smokers in your home (other than you)? No  Risk Factors Exercise: none recently, typically  Dietary issues discussed: Overall healthy diet  Cardiac risk factors: advanced age (older than 39 for men, 25 for women), hypertriglyceridemia  Depression Screen  Have you felt down, depressed or hopeless? No  Have you felt little interest or pleasure in doing things?  No  Activities of Daily Living In your present state of health, do you have any difficulty performing the following activities?:  Driving? Yes Managing money?  Yes Feeding yourself? Yes Getting from bed to chair? Yes Climbing a flight of stairs? Yes Preparing food and eating?: Yes Bathing or showering? Yes Getting dressed: Yes Getting to/using the toilet? Yes Moving around from place to place: Yes In the past year have you fallen or had a near fall?:No   Are you sexually active?   yes  Do you have more than one partner?  No  Hearing Difficulties:  Do you often ask people to speak up or repeat themselves? No Do you experience ringing or noises in your ears? No Do you have difficulty understanding soft or whispered voices? No Vision:              Any change in vision: yes - sees like she cant see as well, exam                     normal             Up to date with eye exam: yes Memory:  Do you feel that you have a problem with memory? No  Do you often misplace items? No  Do you feel safe at home?  Yes  Cognitive Testing  Alert, Orientated? Yes  Normal Appearance? Yes  Recall of three objects?  Yes  Can perform simple calculations? Yes  Displays appropriate judgment? Yes  Can read the correct time from a watch face? Yes   Advanced Directives have been discussed with the patient? Yes  Medications and allergies reviewed with patient and updated if appropriate.  Patient Active Problem List   Diagnosis Date Noted  . Cough 02/20/2015  . Wheezing 02/20/2015  . Viral illness 01/09/2015  . Routine general medical examination at a health care facility 01/25/2014  . Hip bursitis 01/25/2014  . Rapid palpitations 07/28/2012  . Alopecia 07/28/2012  . Asthma   . Allergic rhinitis   . Hypercholesterolemia   . Back pain   . Colitis   .  Hypothyroidism     Current Outpatient Prescriptions on File Prior to Visit  Medication Sig Dispense Refill  . ADVAIR DISKUS 250-50 MCG/DOSE AEPB USE 1 INHALATION EVERY 12 HOURS 180 each 0  . albuterol (PROAIR HFA) 108 (90 BASE) MCG/ACT inhaler INHALE 2 PUFFS EVERY 4 HOURS AS NEEDED FOR WHEEZING OR SHORTNESS OF BREATH 3 each 0  . aspirin 81 MG tablet Take 81 mg by mouth as needed (blood thinner).     . conjugated estrogens (PREMARIN) vaginal cream Place vaginally 3 (three) times a week. 42.5 g 12  . fenofibrate 160 MG tablet Take 1 tablet (160 mg total) by mouth daily. 90 tablet 2  . fluticasone (FLONASE) 50 MCG/ACT nasal spray  Place 2 sprays into both nostrils daily as needed for allergies or rhinitis (allergies). 16 g 3  . HYDROcodone-homatropine (HYCODAN) 5-1.5 MG/5ML syrup Take 5 mLs by mouth every 6 (six) hours as needed for cough. 180 mL 0  . levothyroxine (SYNTHROID, LEVOTHROID) 75 MCG tablet Take 1 tablet (75 mcg total) by mouth daily. 90 tablet 0  . lidocaine (XYLOCAINE) 2 % solution Use as directed 20 mLs in the mouth or throat as needed for mouth pain. 100 mL 0  . loratadine (CLARITIN) 10 MG tablet Take 10 mg by mouth daily as needed for allergies (allergies).    . metoprolol succinate (TOPROL-XL) 25 MG 24 hr tablet TAKE ONE-HALF (1/2) TABLET DAILY 45 tablet 1   No current facility-administered medications on file prior to visit.    Past Medical History  Diagnosis Date  . Hypercholesterolemia   . Back pain   . Generalized headaches   . Colitis   . Hypothyroidism   . Asthma   . Diverticulitis   . Allergic rhinitis, cause unspecified   . GERD (gastroesophageal reflux disease)     Past Surgical History  Procedure Laterality Date  . Back surgery    . Colonoscopy    . Breast biopsy  1982  . Tonsillectomy  1953  . Abdominal hysterectomy  1983  . Appendectomy  1955  . Eye surgery  11/14/2009, 1990    blocked tear duct repair    Social History   Social History  . Marital Status: Married    Spouse Name: N/A  . Number of Children: 2  . Years of Education: 13   Occupational History  . Homemaker    Social History Main Topics  . Smoking status: Never Smoker   . Smokeless tobacco: Never Used     Comment: married, lives with spouse - retired  . Alcohol Use: No  . Drug Use: No  . Sexual Activity: Not Asked   Other Topics Concern  . None   Social History Narrative   Regular exercise-yes   Caffeine Use-no    Review of Systems  Constitutional: Negative for fever and chills.  HENT: Negative for congestion, hearing loss, sinus pressure and sore throat.   Eyes: Positive for visual  disturbance.  Respiratory: Positive for cough, shortness of breath and wheezing.   Cardiovascular: Positive for palpitations (on occasion, transient). Negative for chest pain and leg swelling.  Gastrointestinal: Negative for nausea, abdominal pain, diarrhea, constipation and blood in stool.       Occ GERD  Genitourinary: Negative for dysuria and hematuria.  Musculoskeletal: Positive for neck stiffness. Negative for myalgias, back pain and arthralgias.  Skin: Negative for rash.       No change in moles  Neurological: Positive for dizziness (rare). Negative for light-headedness and headaches.  Psychiatric/Behavioral: Negative for dysphoric mood. The patient is not nervous/anxious.        Objective:   Filed Vitals:   03/22/15 0936  BP: 124/78  Pulse: 67  Temp: 98 F (36.7 C)  Resp: 18   Filed Weights   03/22/15 0936  Weight: 130 lb (58.968 kg)   Body mass index is 23.03 kg/(m^2).   Physical Exam Constitutional: She appears well-developed and well-nourished. No distress.  HENT:  Head: Normocephalic and atraumatic.  Right Ear: External ear normal.  Left Ear: External ear normal.  Mouth/Throat: Oropharynx is clear and moist.  Normal bilateral ear canals and tympanic membranes  Eyes: Conjunctivae and EOM are normal.  Neck: Neck supple. No tracheal deviation present. No thyromegaly present.  No carotid bruit  Cardiovascular: Normal rate, regular rhythm and normal heart sounds.   No murmur heard. Pulmonary/Chest: Effort normal and breath sounds normal. No respiratory distress. She has no wheezes. She has no rales.  Abdominal: Soft. She exhibits no distension. There is no tenderness.  Musculoskeletal: She exhibits no edema.  Lymphadenopathy:    She has no cervical adenopathy.  Skin: Skin is warm and dry. She is not diaphoretic.  Psychiatric: She has a normal mood and affect. Her behavior is normal.       Assessment & Plan:   Wellness exam Wellness screening asked and  stated above no remarkable answers Hm reviewed with patient Pneumococcal vaccine today, deferred flu vaccine Mammo, colonoscopy up to date dexa ordered Hep C ordered with routine blood work EKG done through cardiology Stressed importance of regular exercise Eye exams up to date  Physical Exam:  Screening blood work ordered Pneumovax given today, deferred flu shot, other vaccines up-to-date DEXA ordered Colonoscopy and mammogram up-to-date EKG deferred-done by cardiology Typically exercises regularly, but has not been doing so recently-stressed the importance of regular exercise   Asthma exacerbation She continues to have symptoms of asthma exacerbation No active infection so we will hold off on any antibiotic line continue Advair twice daily and albuterol as needed Continue Flonase and Claritin Prednisone 40 mg daily for 5 days Call if no improvement  See problem list for assessment and plan chronic medical conditions

## 2015-03-23 LAB — HEPATITIS C ANTIBODY: HCV AB: NEGATIVE

## 2015-03-25 ENCOUNTER — Encounter: Payer: Self-pay | Admitting: Internal Medicine

## 2015-04-26 ENCOUNTER — Encounter: Payer: Self-pay | Admitting: Cardiology

## 2015-04-26 ENCOUNTER — Ambulatory Visit (INDEPENDENT_AMBULATORY_CARE_PROVIDER_SITE_OTHER): Payer: Medicare Other | Admitting: Cardiology

## 2015-04-26 VITALS — BP 130/70 | HR 70 | Ht 63.0 in | Wt 132.0 lb

## 2015-04-26 DIAGNOSIS — I471 Supraventricular tachycardia: Secondary | ICD-10-CM | POA: Diagnosis not present

## 2015-04-26 DIAGNOSIS — E038 Other specified hypothyroidism: Secondary | ICD-10-CM

## 2015-04-26 DIAGNOSIS — E7800 Pure hypercholesterolemia, unspecified: Secondary | ICD-10-CM

## 2015-04-26 LAB — BASIC METABOLIC PANEL
BUN: 19 mg/dL (ref 7–25)
CALCIUM: 8.8 mg/dL (ref 8.6–10.4)
CHLORIDE: 103 mmol/L (ref 98–110)
CO2: 28 mmol/L (ref 20–31)
Creat: 0.71 mg/dL (ref 0.50–0.99)
GLUCOSE: 91 mg/dL (ref 65–99)
Potassium: 4 mmol/L (ref 3.5–5.3)
SODIUM: 140 mmol/L (ref 135–146)

## 2015-04-26 LAB — LIPID PANEL
CHOL/HDL RATIO: 2.5 ratio (ref <=5.0)
Cholesterol: 263 mg/dL — ABNORMAL HIGH (ref 125–200)
HDL: 104 mg/dL (ref >=46)
LDL CALC: 147 mg/dL — AB (ref <130)
Triglycerides: 58 mg/dL (ref <150)
VLDL: 12 mg/dL (ref <30)

## 2015-04-26 LAB — TSH: TSH: 2.203 u[IU]/mL (ref 0.350–4.500)

## 2015-04-26 LAB — HEPATIC FUNCTION PANEL
ALK PHOS: 34 U/L (ref 33–130)
ALT: 14 U/L (ref 6–29)
AST: 20 U/L (ref 10–35)
Albumin: 4 g/dL (ref 3.6–5.1)
BILIRUBIN DIRECT: 0.1 mg/dL (ref <=0.2)
BILIRUBIN TOTAL: 0.5 mg/dL (ref 0.2–1.2)
Indirect Bilirubin: 0.4 mg/dL (ref 0.2–1.2)
Total Protein: 6.8 g/dL (ref 6.1–8.1)

## 2015-04-26 LAB — T4, FREE: Free T4: 1.15 ng/dL (ref 0.80–1.80)

## 2015-04-26 NOTE — Progress Notes (Signed)
Cardiology Office Note   Date:  04/26/2015   ID:  Mills, Elizabeth 03/08/1946, MRN [id number]  PCP:  Pincus Sanes, MD  Cardiologist: Cassell Clement MD  Chief Complaint  Patient presents with  . Follow-up    denies chest pain, sob, lee, or claudciation      History of Present Illness: Elizabeth Mills is a 70 y.o. female who presents for  Six-month follow-up visit  This pleasant 70 year old woman is seen for a followup office visit. She has a history of hypercholesterolemia and a history of hypothyroidism. She's had a past history of palpitations. She does not have any history of ischemic heart disease and she had normal nuclear stress test in 2007. She had an echocardiogram in December 2008 which showed an ejection fraction of 60-65% with impaired relaxation and normal LV systolic function.  She has been experiencing some intermittent twitching of her right eye. At times she has decreased vision. She saw her optometrist about a month ago who suggested that perhaps she had some inflammation of the eye. She has been told that Botox injections above the right eyebrow might help the condition. These would have to be repeated indefinitely every 3 months or so. She has not decided to proceed in that direction yet.  The patient has had a lot of problems with a dental abscess and had a recent root canal. She finished her antibiotics yesterday. She is still having some discomfort.  From the cardiac standpoint she has had only one brief spell of tachycardia lasting about 10 minutes a month ago which resolved on its own. History of occasional bouts of paroxysmal supraventricular tachycardia. She's had one episode in the past 6 months which lasted about 2 hours and responded to extra metoprolol. 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 She has not been getting as much intentional aerobic exercise. She has been busy caring for 2 grandchildren ages 70 years old  and 51 years old. She has lost 2 pounds since last visit. She has not been too careful with her diet. We are checking labs today. She is intolerant of statins and is on fenofibrate. The last fenofibrate she got was a larger size and she does not like it is well because it is difficult to swallow.  Past Medical History  Diagnosis Date  . Hypercholesterolemia   . Back pain   . Generalized headaches   . Colitis   . Hypothyroidism   . Asthma   . Diverticulitis   . Allergic rhinitis, cause unspecified   . GERD (gastroesophageal reflux disease)     Past Surgical History  Procedure Laterality Date  . Back surgery    . Colonoscopy    . Breast biopsy  1982  . Tonsillectomy  1953  . Abdominal hysterectomy  1983  . Appendectomy  1955  . Eye surgery  11/14/2009, 1990    blocked tear duct repair     Current Outpatient Prescriptions  Medication Sig Dispense Refill  . ADVAIR DISKUS 250-50 MCG/DOSE AEPB USE 1 INHALATION EVERY 12 HOURS 180 each 0  . albuterol (PROAIR HFA) 108 (90 BASE) MCG/ACT inhaler INHALE 2 PUFFS EVERY 4 HOURS AS NEEDED FOR WHEEZING OR SHORTNESS OF BREATH 3 each 0  . aspirin 81 MG tablet Take 81 mg by mouth as needed (blood thinner).     . conjugated estrogens (PREMARIN) vaginal cream Place vaginally 3 (three) times a week. 42.5 g 12  . fenofibrate 160  MG tablet Take 1 tablet (160 mg total) by mouth daily. 90 tablet 2  . fluticasone (FLONASE) 50 MCG/ACT nasal spray Place 2 sprays into both nostrils daily as needed for allergies or rhinitis (allergies). 16 g 3  . HYDROcodone-homatropine (HYCODAN) 5-1.5 MG/5ML syrup Take 5 mLs by mouth every 6 (six) hours as needed for cough. 180 mL 0  . levothyroxine (SYNTHROID, LEVOTHROID) 75 MCG tablet Take 1 tablet (75 mcg total) by mouth daily. 90 tablet 0  . lidocaine (XYLOCAINE) 2 % solution Use as directed 20 mLs in the mouth or throat as needed for mouth pain. 100 mL 0  . loratadine (CLARITIN) 10 MG tablet Take 10 mg by mouth  daily as needed for allergies (allergies).    . metoprolol succinate (TOPROL-XL) 25 MG 24 hr tablet TAKE ONE-HALF (1/2) TABLET DAILY 45 tablet 1  . predniSONE (DELTASONE) 20 MG tablet Take 2 tablets (40 mg total) by mouth daily with breakfast. 10 tablet 0   No current facility-administered medications for this visit.    Allergies:   Crestor; Lipitor; Phenergan; Pravachol; Promethazine hcl; Sulfa antibiotics; Zetia; and Zocor    Social History:  The patient  reports that she has never smoked. She has never used smokeless tobacco. She reports that she does not drink alcohol or use illicit drugs.   Family History:  The patient's family history includes Arthritis in her father and mother; Cancer in her mother; Hyperlipidemia in her father and mother.    ROS:  Please see the history of present illness.   Otherwise, review of systems are positive for none.   All other systems are reviewed and negative.    PHYSICAL EXAM: VS:  BP 130/70 mmHg  Pulse 70  Ht 5\' 3"  (1.6 m)  Wt 132 lb (59.875 kg)  BMI 23.39 kg/m2 , BMI Body mass index is 23.39 kg/(m^2). GEN: Well nourished, well developed, in no acute distress HEENT: normal Neck: no JVD, carotid bruits, or masses Cardiac: RRR; no murmurs, rubs, or gallops,no edema  Respiratory:  clear to auscultation bilaterally, normal work of breathing GI: soft, nontender, nondistended, + BS MS: no deformity or atrophy Skin: warm and dry, no rash Neuro:  Strength and sensation are intact Psych: euthymic mood, full affect   EKG:  EKG is not ordered today.    Recent Labs: 03/22/2015: ALT 14; BUN 17; Creatinine, Ser 0.75; Hemoglobin 14.4; Platelets 334.0; Potassium 3.8; Sodium 139; TSH 1.03    Lipid Panel    Component Value Date/Time   CHOL 235* 03/22/2015 1042   TRIG 57.0 03/22/2015 1042   HDL 102.60 03/22/2015 1042   CHOLHDL 2 03/22/2015 1042   VLDL 11.4 03/22/2015 1042   LDLCALC 121* 03/22/2015 1042   LDLDIRECT 94.2 07/28/2012 1010       Wt Readings from Last 3 Encounters:  04/26/15 132 lb (59.875 kg)  03/22/15 130 lb (58.968 kg)  02/20/15 131 lb (59.421 kg)       ASSESSMENT AND PLAN: 1. Hypercholesterolemia 2. Palpitations. 3. Hypothyroidism 4. Intermittent twitching of right eye. she is seeing an eye doctor concerning this. 5. Recent dental abscess left jaw   Current medicines are reviewed at length with the patient today.  The patient does not have concerns regarding medicines.  The following changes have been made:  no change  Labs/ tests ordered today include:   Orders Placed This Encounter  Procedures  . Lipid panel  . Hepatic function panel  . Basic metabolic panel  . TSH  .  T4, free    disposition: We are checking lab work today including lipid panel hepatic function panel basal metabolic panel TSH and free T4.  The patient will return for a six-month follow-up office visit with Dr. Delton See. Continue current medication.  Karie Schwalbe MD 04/26/2015 1:10 PM    [facility] [address] [place], [place], [place]  [address] Phone: [phone]; Fax: [phone]

## 2015-04-26 NOTE — Patient Instructions (Signed)
Medication Instructions:  Your physician recommends that you continue on your current medications as directed. Please refer to the Current Medication list given to you today.  Labwork: Lp/bmet/hfp/tsh/ft4   Testing/Procedures: none  Follow-Up: Your physician wants you to follow-up in: 6 month ov with Dr Johnell Comings will receive a reminder letter in the mail two months in advance. If you don't receive a letter, please call our office to schedule the follow-up appointment.  If you need a refill on your cardiac medications before your next appointment, please call your pharmacy.

## 2015-04-27 NOTE — Progress Notes (Signed)
Quick Note:  Please report to patient. The recent labs are stable. Continue same medication and careful diet. Thyroid is okay. Cholesterol is higher. Work harder on diet and exercise. ______

## 2015-05-09 ENCOUNTER — Telehealth: Payer: Self-pay | Admitting: *Deleted

## 2015-05-09 MED ORDER — LEVOTHYROXINE SODIUM 75 MCG PO TABS: 75.0000 ug | ORAL_TABLET | Freq: Every day | ORAL | Status: DC

## 2015-05-09 MED ORDER — FENOFIBRATE 160 MG PO TABS: 160.0000 mg | ORAL_TABLET | Freq: Every day | ORAL | Status: DC

## 2015-05-09 MED ORDER — FLUTICASONE-SALMETEROL 250-50 MCG/DOSE IN AEPB: INHALATION_SPRAY | RESPIRATORY_TRACT | Status: DC

## 2015-05-09 NOTE — Telephone Encounter (Signed)
Receive call pt states she has change care to [facility] needing updated script sent on her Advair, Fenofibrate and Levothyroxine. Inform pt sending electronically as we speak...[name]

## 2015-06-03 ENCOUNTER — Other Ambulatory Visit: Payer: Self-pay | Admitting: Internal Medicine

## 2015-06-03 DIAGNOSIS — Z780 Asymptomatic menopausal state: Secondary | ICD-10-CM

## 2015-06-22 ENCOUNTER — Ambulatory Visit (INDEPENDENT_AMBULATORY_CARE_PROVIDER_SITE_OTHER)
Admission: RE | Admit: 2015-06-22 | Discharge: 2015-06-22 | Disposition: A | Discharge status: Home / Self Care | Payer: Medicare Other | Source: Ambulatory Visit | Attending: Internal Medicine | Admitting: Internal Medicine

## 2015-06-22 DIAGNOSIS — Z780 Asymptomatic menopausal state: Secondary | ICD-10-CM

## 2015-06-27 ENCOUNTER — Encounter: Payer: Self-pay | Admitting: Internal Medicine

## 2015-06-27 DIAGNOSIS — M8580 Other specified disorders of bone density and structure, unspecified site: Secondary | ICD-10-CM | POA: Insufficient documentation

## 2015-06-29 ENCOUNTER — Telehealth: Payer: Self-pay | Admitting: *Deleted

## 2015-06-29 MED ORDER — OSELTAMIVIR PHOSPHATE 75 MG PO CAPS: 75.0000 mg | ORAL_CAPSULE | Freq: Two times a day (BID) | ORAL | Status: DC

## 2015-06-29 NOTE — Telephone Encounter (Signed)
Received call pt states she think she has the flu running a temp 101. Early this week her grand-kids was tested positive symptoms started yesterday with her. Wanting to know can md send her some tamiflu into pharmacy...[name]/lmb

## 2015-06-29 NOTE — Telephone Encounter (Signed)
Notified pt md sent tamiflu to pharmacy. [facility] spoke with Elnita Maxwell(Cheryl) cancel rx resent top [facility].../lmb

## 2015-06-29 NOTE — Telephone Encounter (Signed)
Sent to pharmacy 

## 2015-07-10 ENCOUNTER — Other Ambulatory Visit: Payer: Self-pay | Admitting: *Deleted

## 2015-07-10 MED ORDER — LEVOTHYROXINE SODIUM 75 MCG PO TABS
75.0000 ug | ORAL_TABLET | Freq: Every day | ORAL | Status: DC
Start: 2015-07-10 — End: 2016-06-05

## 2015-07-10 NOTE — Telephone Encounter (Signed)
Received call pt is needing updated script on her Levothyroxine [facility].inform pt will [facility] to [facility]....[name]/lmb

## 2015-08-28 ENCOUNTER — Ambulatory Visit (INDEPENDENT_AMBULATORY_CARE_PROVIDER_SITE_OTHER): Payer: Medicare Other

## 2015-08-28 ENCOUNTER — Other Ambulatory Visit: Payer: Self-pay

## 2015-08-28 VITALS — BP 110/60 | Ht 62.5 in | Wt 131.0 lb

## 2015-08-28 DIAGNOSIS — Z0000 Encounter for general adult medical examination without abnormal findings: Secondary | ICD-10-CM | POA: Diagnosis not present

## 2015-08-28 NOTE — Patient Instructions (Addendum)
Elizabeth Mills , Thank you for taking time to come for your Medicare Wellness Visit. I appreciate your ongoing commitment to your health goals. Please review the following plan we discussed and let me know if I can assist you in the future.   Osteoporosis foundation for more information regarding osteopenia   These are the goals we discussed: Goals    . patient     Taking time to relax one day a week; to decide when and what time works best for you!       This is a list of the screening recommended for you and due dates:  Health Maintenance  Topic Date Due  . Mammogram  07/15/2015  . Flu Shot  10/23/2015  . Colon Cancer Screening  05/22/2020  . Tetanus Vaccine  12/31/2023  . DEXA scan (bone density measurement)  Completed  . Shingles Vaccine  Addressed  .  Hepatitis C: One time screening is recommended by Center for Disease Control  (CDC) for  adults born from [birth year] through 1965.   Completed  . Pneumonia vaccines  Completed     Bone Densitometry Bone densitometry is an imaging test that uses a special X-ray to measure the amount of calcium and other minerals in your bones (bone density). This test is also known as a bone mineral density test or dual-energy X-ray absorptiometry (DXA). The test can measure bone density at your hip and your spine. It is similar to having a regular X-ray. You may have this test to:  Diagnose a condition that causes weak or thin bones (osteoporosis).  Predict your risk of a broken bone (fracture).  Determine how well osteoporosis treatment is working. LET [facility] CARE PROVIDER KNOW ABOUT:  Any allergies you have.  All medicines you are taking, including vitamins, herbs, eye drops, creams, and over-the-counter medicines.  Previous problems you or members of your family have had with the use of anesthetics.  Any blood disorders you have.  Previous surgeries you have had.  Medical conditions you have.  Possibility of pregnancy.  Any other  medical test you had within the previous 14 days that used contrast material. RISKS AND COMPLICATIONS Generally, this is a safe procedure. However, problems can occur and may include the following:  This test exposes you to a very small amount of radiation.  The risks of radiation exposure may be greater to unborn children. BEFORE THE PROCEDURE  Do not take any calcium supplements for 24 hours before having the test. You can otherwise eat and drink what you usually do.  Take off all metal jewelry, eyeglasses, dental appliances, and any other metal objects. PROCEDURE  You may lie on an exam table. There will be an X-ray generator below you and an imaging device above you.  Other devices, such as boxes or braces, may be used to position your body properly for the scan.  You will need to lie still while the machine slowly scans your body.  The images will show up on a computer monitor. AFTER THE PROCEDURE You may need more testing at a later time.   This information is not intended to replace advice given to you by your health care provider. Make sure you discuss any questions you have with your health care provider.   Document Released: 04/01/2004 Document Revised: 03/31/2014 Document Reviewed: 08/18/2013 Elsevier Interactive Patient Education 2016 [place] in the Home  Falls can cause injuries. They can happen to people of all ages. There are  many things you can do to make your home safe and to help prevent falls.  WHAT CAN I DO ON THE OUTSIDE OF MY HOME?  Regularly fix the edges of walkways and driveways and fix any cracks.  Remove anything that might make you trip as you walk through a door, such as a raised step or threshold.  Trim any bushes or trees on the path to your home.  Use bright outdoor lighting.  Clear any walking paths of anything that might make someone trip, such as rocks or tools.  Regularly check to see if handrails are loose or  broken. Make sure that both sides of any steps have handrails.  Any raised decks and porches should have guardrails on the edges.  Have any leaves, snow, or ice cleared regularly.  Use sand or salt on walking paths during winter.  Clean up any spills in your garage right away. This includes oil or grease spills. WHAT CAN I DO IN THE BATHROOM?   Use night lights.  Install grab bars by the toilet and in the tub and shower. Do not use towel bars as grab bars.  Use non-skid mats or decals in the tub or shower.  If you need to sit down in the shower, use a plastic, non-slip stool.  Keep the floor dry. Clean up any water that spills on the floor as soon as it happens.  Remove soap buildup in the tub or shower regularly.  Attach bath mats securely with double-sided non-slip rug tape.  Do not have throw rugs and other things on the floor that can make you trip. WHAT CAN I DO IN THE BEDROOM?  Use night lights.  Make sure that you have a light by your bed that is easy to reach.  Do not use any sheets or blankets that are too big for your bed. They should not hang down onto the floor.  Have a firm chair that has side arms. You can use this for support while you get dressed.  Do not have throw rugs and other things on the floor that can make you trip. WHAT CAN I DO IN THE KITCHEN?  Clean up any spills right away.  Avoid walking on wet floors.  Keep items that you use a lot in easy-to-reach places.  If you need to reach something above you, use a strong step stool that has a grab bar.  Keep electrical cords out of the way.  Do not use floor polish or wax that makes floors slippery. If you must use wax, use non-skid floor wax.  Do not have throw rugs and other things on the floor that can make you trip. WHAT CAN I DO WITH MY STAIRS?  Do not leave any items on the stairs.  Make sure that there are handrails on both sides of the stairs and use them. Fix handrails that are  broken or loose. Make sure that handrails are as long as the stairways.  Check any carpeting to make sure that it is firmly attached to the stairs. Fix any carpet that is loose or worn.  Avoid having throw rugs at the top or bottom of the stairs. If you do have throw rugs, attach them to the floor with carpet tape.  Make sure that you have a light switch at the top of the stairs and the bottom of the stairs. If you do not have them, ask someone to add them for you. WHAT ELSE CAN I DO TO  HELP PREVENT FALLS?  Wear shoes that:  Do not have high heels.  Have rubber bottoms.  Are comfortable and fit you well.  Are closed at the toe. Do not wear sandals.  If you use a stepladder:  Make sure that it is fully opened. Do not climb a closed stepladder.  Make sure that both sides of the stepladder are locked into place.  Ask someone to hold it for you, if possible.  Clearly mark and make sure that you can see:  Any grab bars or handrails.  First and last steps.  Where the edge of each step is.  Use tools that help you move around (mobility aids) if they are needed. These include:  Canes.  Walkers.  Scooters.  Crutches.  Turn on the lights when you go into a dark area. Replace any light bulbs as soon as they burn out.  Set up your furniture so you have a clear path. Avoid moving your furniture around.  If any of your floors are uneven, fix them.  If there are any pets around you, be aware of where they are.  Review your medicines with your doctor. Some medicines can make you feel dizzy. This can increase your chance of falling. Ask your doctor what other things that you can do to help prevent falls.   This information is not intended to replace advice given to you by your health care provider. Make sure you discuss any questions you have with your health care provider.   Document Released: 01/04/2009 Document Revised: 07/25/2014 Document Reviewed: 04/14/2014 Elsevier  Interactive Patient Education 2016 [place] Maintenance, Female Adopting a healthy lifestyle and getting preventive care can go a long way to promote health and wellness. Talk with your health care provider about what schedule of regular examinations is right for you. This is a good chance for you to check in with your provider about disease prevention and staying healthy. In between checkups, there are plenty of things you can do on your own. Experts have done a lot of research about which lifestyle changes and preventive measures are most likely to keep you healthy. Ask your health care provider for more information. WEIGHT AND DIET  Eat a healthy diet  Be sure to include plenty of vegetables, fruits, low-fat dairy products, and lean protein.  Do not eat a lot of foods high in solid fats, added sugars, or salt.  Get regular exercise. This is one of the most important things you can do for your health.  Most adults should exercise for at least 150 minutes each week. The exercise should increase your heart rate and make you sweat (moderate-intensity exercise).  Most adults should also do strengthening exercises at least twice a week. This is in addition to the moderate-intensity exercise.  Maintain a healthy weight  Body mass index (BMI) is a measurement that can be used to identify possible weight problems. It estimates body fat based on height and weight. Your health care provider can help determine your BMI and help you achieve or maintain a healthy weight.  For females 41 years of age and older:   A BMI below 18.5 is considered underweight.  A BMI of 18.5 to 24.9 is normal.  A BMI of 25 to 29.9 is considered overweight.  A BMI of 30 and above is considered obese.  Watch levels of cholesterol and blood lipids  You should start having your blood tested for lipids and cholesterol at 70 years of  age, then have this test every 5 years.  You may need to have your  cholesterol levels checked more often if:  Your lipid or cholesterol levels are high.  You are older than 70 years of age.  You are at high risk for heart disease.  CANCER SCREENING   Lung Cancer  Lung cancer screening is recommended for adults 97-73 years old who are at high risk for lung cancer because of a history of smoking.  A yearly low-dose CT scan of the lungs is recommended for people who:  Currently smoke.  Have quit within the past 15 years.  Have at least a 30-pack-year history of smoking. A pack year is smoking an average of one pack of cigarettes a day for 1 year.  Yearly screening should continue until it has been 15 years since you quit.  Yearly screening should stop if you develop a health problem that would prevent you from having lung cancer treatment.  Breast Cancer  Practice breast self-awareness. This means understanding how your breasts normally appear and feel.  It also means doing regular breast self-exams. Let your health care provider know about any changes, no matter how small.  If you are in your 20s or 30s, you should have a clinical breast exam (CBE) by a health care provider every 1-3 years as part of a regular health exam.  If you are 31 or older, have a CBE every year. Also consider having a breast X-ray (mammogram) every year.  If you have a family history of breast cancer, talk to your health care provider about genetic screening.  If you are at high risk for breast cancer, talk to your health care provider about having an MRI and a mammogram every year.  Breast cancer gene (BRCA) assessment is recommended for women who have family members with BRCA-related cancers. BRCA-related cancers include:  Breast.  Ovarian.  Tubal.  Peritoneal cancers.  Results of the assessment will determine the need for genetic counseling and BRCA1 and BRCA2 testing. Cervical Cancer Your health care provider may recommend that you be screened regularly  for cancer of the pelvic organs (ovaries, uterus, and vagina). This screening involves a pelvic examination, including checking for microscopic changes to the surface of your cervix (Pap test). You may be encouraged to have this screening done every 3 years, beginning at age 110.  For women ages 29-65, health care providers may recommend pelvic exams and Pap testing every 3 years, or they may recommend the Pap and pelvic exam, combined with testing for human papilloma virus (HPV), every 5 years. Some types of HPV increase your risk of cervical cancer. Testing for HPV may also be done on women of any age with unclear Pap test results.  Other health care providers may not recommend any screening for nonpregnant women who are considered low risk for pelvic cancer and who do not have symptoms. Ask your health care provider if a screening pelvic exam is right for you.  If you have had past treatment for cervical cancer or a condition that could lead to cancer, you need Pap tests and screening for cancer for at least 20 years after your treatment. If Pap tests have been discontinued, your risk factors (such as having a new sexual partner) need to be reassessed to determine if screening should resume. Some women have medical problems that increase the chance of getting cervical cancer. In these cases, your health care provider may recommend more frequent screening and Pap tests. Colorectal  Cancer  This type of cancer can be detected and often prevented.  Routine colorectal cancer screening usually begins at 70 years of age and continues through 70 years of age.  Your health care provider may recommend screening at an earlier age if you have risk factors for colon cancer.  Your health care provider may also recommend using home test kits to check for hidden blood in the stool.  A small camera at the end of a tube can be used to examine your colon directly (sigmoidoscopy or colonoscopy). This is done to check  for the earliest forms of colorectal cancer.  Routine screening usually begins at age 71.  Direct examination of the colon should be repeated every 5-10 years through 70 years of age. However, you may need to be screened more often if early forms of precancerous polyps or small growths are found. Skin Cancer  Check your skin from head to toe regularly.  Tell your health care provider about any new moles or changes in moles, especially if there is a change in a mole's shape or color.  Also tell your health care provider if you have a mole that is larger than the size of a pencil eraser.  Always use sunscreen. Apply sunscreen liberally and repeatedly throughout the day.  Protect yourself by wearing long sleeves, pants, a wide-brimmed hat, and sunglasses whenever you are outside. HEART DISEASE, DIABETES, AND HIGH BLOOD PRESSURE   High blood pressure causes heart disease and increases the risk of stroke. High blood pressure is more likely to develop in:  People who have blood pressure in the high end of the normal range (130-139/85-89 mm Hg).  People who are overweight or obese.  People who are African American.  If you are 26-80 years of age, have your blood pressure checked every 3-5 years. If you are 41 years of age or older, have your blood pressure checked every year. You should have your blood pressure measured twice--once when you are at a hospital or clinic, and once when you are not at a hospital or clinic. Record the average of the two measurements. To check your blood pressure when you are not at a hospital or clinic, you can use:  An automated blood pressure machine at a pharmacy.  A home blood pressure monitor.  If you are between 61 years and 88 years old, ask your health care provider if you should take aspirin to prevent strokes.  Have regular diabetes screenings. This involves taking a blood sample to check your fasting blood sugar level.  If you are at a normal  weight and have a low risk for diabetes, have this test once every three years after 70 years of age.  If you are overweight and have a high risk for diabetes, consider being tested at a younger age or more often. PREVENTING INFECTION  Hepatitis B  If you have a higher risk for hepatitis B, you should be screened for this virus. You are considered at high risk for hepatitis B if:  You were born in a country where hepatitis B is common. Ask your health care provider which countries are considered high risk.  Your parents were born in a high-risk country, and you have not been immunized against hepatitis B (hepatitis B vaccine).  You have HIV or AIDS.  You use needles to inject street drugs.  You live with someone who has hepatitis B.  You have had sex with someone who has hepatitis B.  You  get hemodialysis treatment.  You take certain medicines for conditions, including cancer, organ transplantation, and autoimmune conditions. Hepatitis C  Blood testing is recommended for:  Everyone born from [birth year] through 1965.  Anyone with known risk factors for hepatitis C. Sexually transmitted infections (STIs)  You should be screened for sexually transmitted infections (STIs) including gonorrhea and chlamydia if:  You are sexually active and are younger than 71 years of age.  You are older than 70 years of age and your health care provider tells you that you are at risk for this type of infection.  Your sexual activity has changed since you were last screened and you are at an increased risk for chlamydia or gonorrhea. Ask your health care provider if you are at risk.  If you do not have HIV, but are at risk, it may be recommended that you take a prescription medicine daily to prevent HIV infection. This is called pre-exposure prophylaxis (PrEP). You are considered at risk if:  You are sexually active and do not regularly use condoms or know the HIV status of your partner(s).  You take  drugs by injection.  You are sexually active with a partner who has HIV. Talk with your health care provider about whether you are at high risk of being infected with HIV. If you choose to begin PrEP, you should first be tested for HIV. You should then be tested every 3 months for as long as you are taking PrEP.  PREGNANCY   If you are premenopausal and you may become pregnant, ask your health care provider about preconception counseling.  If you may become pregnant, take 400 to 800 micrograms (mcg) of folic acid every day.  If you want to prevent pregnancy, talk to your health care provider about birth control (contraception). OSTEOPOROSIS AND MENOPAUSE   Osteoporosis is a disease in which the bones lose minerals and strength with aging. This can result in serious bone fractures. Your risk for osteoporosis can be identified using a bone density scan.  If you are 15 years of age or older, or if you are at risk for osteoporosis and fractures, ask your health care provider if you should be screened.  Ask your health care provider whether you should take a calcium or vitamin D supplement to lower your risk for osteoporosis.  Menopause may have certain physical symptoms and risks.  Hormone replacement therapy may reduce some of these symptoms and risks. Talk to your health care provider about whether hormone replacement therapy is right for you.  HOME CARE INSTRUCTIONS   Schedule regular health, dental, and eye exams.  Stay current with your immunizations.   Do not use any tobacco products including cigarettes, chewing tobacco, or electronic cigarettes.  If you are pregnant, do not drink alcohol.  If you are breastfeeding, limit how much and how often you drink alcohol.  Limit alcohol intake to no more than 1 drink per day for nonpregnant women. One drink equals 12 ounces of beer, 5 ounces of wine, or 1 ounces of hard liquor.  Do not use street drugs.  Do not share  needles.  Ask your health care provider for help if you need support or information about quitting drugs.  Tell your health care provider if you often feel depressed.  Tell your health care provider if you have ever been abused or do not feel safe at home.   This information is not intended to replace advice given to you by your health care provider.  Make sure you discuss any questions you have with your health care provider.   Document Released: 09/23/2010 Document Revised: 03/31/2014 Document Reviewed: 02/09/2013 Elsevier Interactive Patient Education [employer].

## 2015-08-28 NOTE — Telephone Encounter (Signed)
AWV completed today; Does not see gyn and would like her Premarin Vaginal creme refilled.  Please advise, Tks

## 2015-08-28 NOTE — Telephone Encounter (Signed)
Please advise 

## 2015-08-28 NOTE — Progress Notes (Addendum)
Subjective:   Elizabeth Mills is a 70 y.o. female who presents for Medicare Annual (Subsequent) preventive examination.  Review of Systems: Completed by MD recently      HRA assessment completed during visit; Elizabeth Mills, Elizabeth Mills  The Patient was informed that this wellness visit is to identify risk and educate on how to reduce risk for increase disease through lifestyle changes.   Family and medical hx given below;  mother had cancer; OA Father hyperlipidemia as well  Lifestyle review:   HDL 104.000 04/26/2015 LDL 147.000 04/26/2015/ goal 130 Cholesterol, total 263.000 04/26/2015 Triglycerides 58.000 04/26/2015 HbA1C N/D Glucose Random 91.000 04/26/2015 Osteopenia/ educated  Goal is to reduce her waist line  Psychosocial Lives with spouse;  Tobacco: no  ETOH no  Medication review/ Adherent;   BMI: 23.6   Diet;    Eats cereal, bananas and nuts for breakfast Eggs and bacon occasionally Lunch; left overs; meat and couple of veg; well rounded meal  Supper; eats a little lighter at hs;  [employer]eps[employer]ottage cheese;  Sweets; and cookie p she eats;   Exercise;   Taking care of grandchildren; kept grandson with issues/ ran fever x 1 year which was very stressful. Febrile for unidentified reason but now better after one year. Also has large farm; spouse continues to manage with help.  To de-stress:   Spouse and her read; cooking and meals; Can get away to river once a month but states she takes little time for herself    Stress: Discussed and collaborated on ways to increase time for self;  spouse has retired; [id number]; Enjoys the outdoors; The patient is indoors;  States she is working on taking time for herself without guilt. Developed a plan today   HOME SAFETY;  No falls;  Removal of clutter clearing paths through the home,   Railing as needed discussed   Community safety; YES Smoke detectors YES Firearms safety reviewed and will keep in a safe place if these exist. Driving accidents;  no;   Advised to use sun protection or large brim hat  Stressors (1-5) States it is manageable;   Depression: Denies feeling depressed or hopeless; voices pleasure in daily life  Goal; To take more time for herself; block off a few hours at least once a week;   Cognitive;  Manages checkbook, medications; no failures of task Ad8 score reviewed for issues;  Issues making decisions; no  Less interest in hobbies / activities" no  Repeats questions, stories; family complaining: NO  Trouble using ordinary gadgets; microwave; computer: no  Forgets the month or year: no  Mismanaging finances: no  Missing apt: no but does write them down  Daily problems with thinking of memory NO Ad8 score is 0   Fall assessment no Gait assessment appears normal;   Mobilization and Functional losses from last year to this year? No but does not get up as quickly as last year.   Sleep pattern changes; gets 6 to 7 hours;  Will not go back to sleep after going to the bathroom; Discussed ways to manage;   Urinary or fecal incontinence reviewed: no  Counseling Health Maintenance Colonoscopy; 03/012012; 05/2020  EKG: 09/2014 Mammogram: 12./20/2013/ had one at the Breast center 11 or 12 of 2016 Will fup with the Breast center to confirm Dexa/ 06/2015 T-score  -0.8   RFN:-1.5 LFN: -1.2      Recommend optimizing calcium (1200 mg/day) and vitamin D (800 IU/day) intake  PAP: educated regarding the need for GYN exam; /  quit last year; Was seeing Pamelia Hoit;   Hearing: 4000 left and 2000 right Ophthalmology exam; last August / due 10/2015  Immunizations completed;  Prevnar 01/2014 /PSV23 03/22/2015 Tdap 12/2013 Flu; 12/2013/ Stated she was around her grandchildren last year and had the flu x 2 /Treats aggressively as appropriate due to asthma  Advanced Directive; yes; will bring copy for chart  Health Recommendations and Referrals   Barriers to Success; None identified   Current  Care Team reviewed and updated  [facility] - 04/26/2015 2 visits since 09/22/2014 Cheryll [facility] 03/22/2015 1 visits since 03/22/2015       Objective:     Vitals: BP 110/60 mmHg  Ht 5' 2.5" (1.588 m)  Wt 131 lb (59.421 kg)  BMI 23.56 kg/m2  Body mass index is 23.56 kg/(m^2).   Tobacco History  Smoking status  . Never Smoker   Smokeless tobacco  . Never Used    Comment: married, lives with spouse - retired     Counseling given: Not Answered   Past Medical History  Diagnosis Date  . Hypercholesterolemia   . Back pain   . Generalized headaches   . Colitis   . Hypothyroidism   . Asthma   . Diverticulitis   . Allergic rhinitis, cause unspecified   . GERD (gastroesophageal reflux disease)    Past Surgical History  Procedure Laterality Date  . Back surgery    . Colonoscopy    . Breast biopsy  1982  . Tonsillectomy  1953  . Abdominal hysterectomy  1983  . Appendectomy  1955  . Eye surgery  11/14/2009, 1990    blocked tear duct repair   Family History  Problem Relation Age of Onset  . Cancer Mother     Kidney and Ovarian Cancer  . Arthritis Mother   . Hyperlipidemia Mother   . Arthritis Father   . Hyperlipidemia Father    History  Sexual Activity  . Sexual Activity: Not on file    Outpatient Encounter Prescriptions as of 08/28/2015  Medication Sig  . albuterol (PROAIR HFA) 108 (90 BASE) MCG/ACT inhaler INHALE 2 PUFFS EVERY 4 HOURS AS NEEDED FOR WHEEZING OR SHORTNESS OF BREATH  . aspirin 81 MG tablet Take 81 mg by mouth as needed (blood thinner).   . conjugated estrogens (PREMARIN) vaginal cream Place vaginally 3 (three) times a week.  . fenofibrate 160 MG tablet Take 1 tablet (160 mg total) by mouth daily.  . fluticasone (FLONASE) 50 MCG/ACT nasal spray Place 2 sprays into both nostrils daily as needed for allergies or rhinitis (allergies).  .  Fluticasone-Salmeterol (ADVAIR DISKUS) 250-50 MCG/DOSE AEPB USE 1 INHALATION EVERY 12 HOURS  . levothyroxine (SYNTHROID, LEVOTHROID) 75 MCG tablet Take 1 tablet (75 mcg total) by mouth daily.  Marland Kitchen loratadine (CLARITIN) 10 MG tablet Take 10 mg by mouth daily as needed for allergies (allergies).  . metoprolol succinate (TOPROL-XL) 25 MG 24 hr tablet TAKE ONE-HALF (1/2) TABLET DAILY  . HYDROcodone-homatropine (HYCODAN) 5-1.5 MG/5ML syrup Take 5 mLs by mouth every 6 (six) hours as needed for cough. (Patient not taking: Reported on 08/28/2015)  . lidocaine (XYLOCAINE) 2 % solution Use as directed 20 mLs in the mouth or throat as needed for mouth pain. (Patient not taking: Reported on 08/28/2015)  . oseltamivir (TAMIFLU) 75 MG capsule Take 1 capsule (75 mg total) by mouth 2 (two) times daily. (Patient not taking: Reported on 08/28/2015)  .  predniSONE (DELTASONE) 20 MG tablet Take 2 tablets (40 mg total) by mouth daily with breakfast. (Patient not taking: Reported on 08/28/2015)   No facility-administered encounter medications on file as of 08/28/2015.    Activities of Daily Living In your present state of health, do you have any difficulty performing the following activities: 08/28/2015  Hearing? N  Vision? N  Difficulty concentrating or making decisions? N  Walking or climbing stairs? N  Dressing or bathing? N  Doing errands, shopping? N  Preparing Food and eating ? N  Using the Toilet? N  In the past six months, have you accidently leaked urine? N  Do you have problems with loss of bowel control? N    Patient Care Team: Pincus Sanes, MD as PCP - General (Internal Medicine) Cassell Clement, MD (Cardiology) Gretta Cool, MD (Obstetrics and Gynecology) Bernette Redbird, MD (Gastroenterology)    Assessment:     Exercise Activities and Dietary recommendations    Goals    . patient     Taking time to relax one day a week; to decide when and what time works best for you!      Fall Risk Fall  Risk  08/28/2015 03/28/2014 03/10/2013  Falls in the past year? No No No   Depression Screen PHQ 2/9 Scores 08/28/2015 03/28/2014 03/10/2013  PHQ - 2 Score 0 0 0     Cognitive Testing No flowsheet data found.  Ad8 score 0; Appropriate in mood and orientation today. 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   Immunization History  Administered Date(s) Administered  . Influenza Split 12/23/2011  . Influenza,inj,Quad PF,36+ Mos 03/10/2013, 12/30/2013  . Pneumococcal Conjugate-13 01/25/2014  . Pneumococcal Polysaccharide-23 03/22/2015  . Pneumococcal-Unspecified 03/25/2007  . Td 03/24/2002  . Tdap 12/30/2013   Screening Tests Health Maintenance  Topic Date Due  . MAMMOGRAM  07/15/2015  . INFLUENZA VACCINE  10/23/2015  . COLONOSCOPY  05/22/2020  . TETANUS/TDAP  12/31/2023  . DEXA SCAN  Completed  . ZOSTAVAX  Addressed  . Hepatitis C Screening  Completed  . PNA vac Low Risk Adult  Completed      Plan:     Call the breast center to confirm mammogram   Not following any longer with GYN; Will send basket note to Dr. Lawerance Bach for refill or premarin creme which she uses sparingly.  During the course of the visit the patient was educated and counseled about the following appropriate screening and preventive services:   Vaccines to include Pneumoccal, Influenza, Hepatitis B, Td, Zostavax, HCV  Electrocardiogram 09/2014  Cardiovascular Disease/   BMI normal; needs stress reduction; brainstormed on strategies  Colorectal cancer screening/ Due 05/2020  Bone density screening/ 05/2015; continue calcium and Vit D and did discuss wt bearing exercise; has  1/4 mile walking path outside of home and encouraged to walk this with small weights in hands; 3 to 4 lbs. As tolerated  Diabetes screening/ neg  Glaucoma screening/ due 10/2016 neg  Mammography/ completed at the breast center this year  Nutrition counseling / discussed sugar and sweets; to cut back on cookies for weight loss; spouse eats  deserts. Discussed ways to manage.   Patient Instructions (the written plan) was given to the patient.   Terrika Zuver, RN  08/28/2015     Medical screening examination/treatment/procedure(s) were performed by non-physician practitioner and as supervising physician I was immediately available for consultation/collaboration. I agree with above. Pincus Sanes, MD

## 2015-08-29 MED ORDER — ESTROGENS, CONJUGATED 0.625 MG/GM VA CREA: TOPICAL_CREAM | VAGINAL | Status: DC

## 2015-08-29 NOTE — Telephone Encounter (Signed)
Ok to renew - it is pending - not sure if she [facility] or [facility]...Marland Kitchen..Marland Kitchen

## 2015-09-05 ENCOUNTER — Telehealth: Payer: Self-pay | Admitting: *Deleted

## 2015-09-05 NOTE — Telephone Encounter (Signed)
Okay to try alternative

## 2015-09-05 NOTE — Telephone Encounter (Signed)
Left msg on triage stating script for Premarin is not cover under pt insurance plan. The alternative would be Eltrace cream. Woulf it be ok to change to alternative if not PA will be needed. When calling back use ref # [id number]...[name]/lmb

## 2015-09-06 MED ORDER — ESTRADIOL 0.1 MG/GM VA CREA: 1.0000 | TOPICAL_CREAM | VAGINAL | Status: DC

## 2015-09-06 NOTE — Telephone Encounter (Signed)
[facility] of [facility]...[name]/lmb

## 2015-09-06 NOTE — Addendum Note (Signed)
Addended by: Deatra JamesBRAND, LUCY M on: 09/06/2015 08:44 AM   Modules accepted: Orders, Medications

## 2015-09-07 NOTE — Telephone Encounter (Signed)
Receive call back [facility] wanting to know the amount pt will be inserting on the Estrace cream. She states usually insert by grams recommending 1 gm tid. Pls advise...[name]/lmb

## 2015-09-07 NOTE — Telephone Encounter (Signed)
[facility] spoke w/[facility] pharmacist gave md response...[name]/lmb

## 2015-09-07 NOTE — Addendum Note (Signed)
Addended by: Deatra JamesBRAND, Yamira Papa M on: 09/07/2015 03:06 PM   Modules accepted: Medications

## 2015-09-07 NOTE — Telephone Encounter (Addendum)
She does not use it daily -- 1gm 2-3 times a week .  Thanks.

## 2015-10-23 ENCOUNTER — Other Ambulatory Visit: Payer: Self-pay | Admitting: Internal Medicine

## 2015-10-23 MED ORDER — ALBUTEROL SULFATE HFA 108 (90 BASE) MCG/ACT IN AERS: INHALATION_SPRAY | RESPIRATORY_TRACT | 0 refills | Status: DC

## 2015-11-08 ENCOUNTER — Ambulatory Visit (INDEPENDENT_AMBULATORY_CARE_PROVIDER_SITE_OTHER): Payer: Medicare Other | Admitting: Cardiology

## 2015-11-08 VITALS — BP 120/60 | HR 70 | Ht 62.0 in | Wt 134.0 lb

## 2015-11-08 DIAGNOSIS — E7800 Pure hypercholesterolemia, unspecified: Secondary | ICD-10-CM | POA: Diagnosis not present

## 2015-11-08 DIAGNOSIS — E038 Other specified hypothyroidism: Secondary | ICD-10-CM

## 2015-11-08 DIAGNOSIS — I10 Essential (primary) hypertension: Secondary | ICD-10-CM | POA: Diagnosis not present

## 2015-11-08 DIAGNOSIS — I471 Supraventricular tachycardia: Secondary | ICD-10-CM | POA: Diagnosis not present

## 2015-11-08 NOTE — Progress Notes (Signed)
Cardiology Office Note   Date:  11/08/2015   ID:  Elizabeth Mills, DOB 1945-08-01, MRN [id number]  PCP:  Pincus SanesStacy J Burns, MD  Cardiologist: Cassell Clementhomas Brackbill MD --Tobias Alexander>Romina Divirgilio  Chief complain: Six-month follow-up   History of Present Illness: Elizabeth Mills is a 70 y.o. female who presents for a six-month follow-up , this is a very pleasant lady who has been followed by Dr. Patty SermonsBrackbill for many years for hyper lipidemia hypothyroidism and history of palpitations. She does not have any history of ischemic heart disease and she had normal nuclear stress test in 2007. She had an echocardiogram in December 2008 which showed an ejection fraction of 60-65% with impaired relaxation and normal LV systolic function.  She has intermittent episodic episodes of palpitations that responds to metoprolol, date a cure approximately twice a year. Since the last visit she had only one episode a few days ago to last a few seconds. These happen after she had caffeineted drink. She stays active but recently she has gained some weight as she was not careful about her diet.  Past Medical History:  Diagnosis Date  . Allergic rhinitis, cause unspecified   . Asthma   . Back pain   . Colitis   . Diverticulitis   . Generalized headaches   . GERD (gastroesophageal reflux disease)   . Hypercholesterolemia   . Hypothyroidism    Past Surgical History:  Procedure Laterality Date  . ABDOMINAL HYSTERECTOMY  1983  . APPENDECTOMY  1955  . BACK SURGERY    . BREAST BIOPSY  1982  . COLONOSCOPY    . EYE SURGERY  11/14/2009, 1990   blocked tear duct repair  . TONSILLECTOMY  1953    Current Outpatient Prescriptions  Medication Sig Dispense Refill  . albuterol (PROAIR HFA) 108 (90 Base) MCG/ACT inhaler INHALE 2 PUFFS EVERY 4 HOURS AS NEEDED FOR WHEEZING OR SHORTNESS OF BREATH Yearly physical due in Dec 3 each 0  . aspirin 81 MG tablet Take 81 mg by mouth as needed (blood thinner).     Marland Kitchen. estradiol (ESTRACE)  0.1 MG/GM vaginal cream Place 1 Applicatorful vaginally 3 (three) times a week. (Patient taking differently: Place 1 g vaginally 3 (three) times a week. ) 127.5 g 3  . fenofibrate 160 MG tablet Take 1 tablet (160 mg total) by mouth daily. 90 tablet 3  . fluticasone (FLONASE) 50 MCG/ACT nasal spray Place 2 sprays into both nostrils daily as needed for allergies or rhinitis (allergies). 16 g 3  . Fluticasone-Salmeterol (ADVAIR DISKUS) 250-50 MCG/DOSE AEPB USE 1 INHALATION EVERY 12 HOURS 180 each 3  . levothyroxine (SYNTHROID, LEVOTHROID) 75 MCG tablet Take 1 tablet (75 mcg total) by mouth daily. 90 tablet 2  . loratadine (CLARITIN) 10 MG tablet Take 10 mg by mouth daily as needed for allergies (allergies).    . metoprolol succinate (TOPROL-XL) 25 MG 24 hr tablet TAKE ONE-HALF (1/2) TABLET DAILY 45 tablet 1   No current facility-administered medications for this visit.     Allergies:   Crestor [rosuvastatin calcium]; Lipitor [atorvastatin calcium]; Phenergan [promethazine hcl]; Pravachol; Promethazine hcl; Sulfa antibiotics; Zetia [ezetimibe]; and Zocor [simvastatin - high dose]    Social History:  The patient  reports that she has never smoked. She has never used smokeless tobacco. She reports that she does not drink alcohol or use drugs.   Family History:  The patient's family history includes Arthritis in her father and mother; Cancer in her mother; Hyperlipidemia in her  father and mother.    ROS:  Please see the history of present illness.   Otherwise, review of systems are positive for none.   All other systems are reviewed and negative.    PHYSICAL EXAM: VS:  BP 120/60   Pulse 70   Ht 5\' 2"  (1.575 m)   Wt 134 lb (60.8 kg)   BMI 24.51 kg/m  , BMI Body mass index is 24.51 kg/m. GEN: Well nourished, well developed, in no acute distress  HEENT: normal  Neck: no JVD, carotid bruits, or masses Cardiac: RRR; no murmurs, rubs, or gallops,no edema  Respiratory:  clear to auscultation  bilaterally, normal work of breathing GI: soft, nontender, nondistended, + BS MS: no deformity or atrophy  Skin: warm and dry, no rash Neuro:  Strength and sensation are intact Psych: euthymic mood, full affect   EKG:  EKG is not ordered today.    Recent Labs: 03/22/2015: Hemoglobin 14.4; Platelets 334.0 04/26/2015: ALT 14; BUN 19; Creat 0.71; Potassium 4.0; Sodium 140; TSH 2.203    Lipid Panel    Component Value Date/Time   CHOL 263 (H) 04/26/2015 0939   TRIG 58 04/26/2015 0939   HDL 104 04/26/2015 0939   CHOLHDL 2.5 04/26/2015 0939   VLDL 12 04/26/2015 0939   LDLCALC 147 (H) 04/26/2015 0939   LDLDIRECT 94.2 07/28/2012 1010   Wt Readings from Last 3 Encounters:  11/08/15 134 lb (60.8 kg)  08/28/15 131 lb (59.4 kg)  04/26/15 132 lb (59.9 kg)      ASSESSMENT AND PLAN: 1. Hypercholesterolemia 2. Palpitations. 3. Hypothyroidism  Today's vitals and EKG completely normal. Patient is advised to continue exercising and watch her diet more closely as her LDL has been increasing from 102-120-147. She has been intolerant to at least 4 statins so for now we'll just advise diet changes and check her lipids in 6 months. She had minimal symptoms of palpitations so for now no change in therapy.  Follow-up in 6 months with lipids CMP, CBC and TSH prior to the appointment.  Current medicines are reviewed at length with the patient today.  The patient does not have concerns regarding medicines.  The following changes have been made:  no change  Labs/ tests ordered today include:   No orders of the defined types were placed in this encounter.  Virgel ManifoldSigned, Severo Beber 11/08/2015 9:31 AM    [facility] [address], [place], [place]  [address] Phone: [phone]; Fax: [phone]

## 2015-11-08 NOTE — Patient Instructions (Signed)
Medication Instructions:   Your physician recommends that you continue on your current medications as directed. Please refer to the Current Medication list given to you today.   Labwork:  PRIOR TO YOUR 6 MONTH OFFICE VISIT WITH DR Delton SeeNELSON TO CHECK A --CMET, TSH, CBC W DIFF, AND LIPIDS--PLEASE COME FASTING TO THIS LAB APPOINTMENT    Follow-Up:  Your physician wants you to follow-up in: 6 MONTHS WITH DR Johnell ComingsNELSON You will receive a reminder letter in the mail two months in advance. If you don't receive a letter, please call our office to schedule the follow-up appointment.  PLEASE HAVE YOUR LABS DONE PRIOR TO THIS APPOINTMENT       If you need a refill on your cardiac medications before your next appointment, please call your pharmacy.

## 2016-03-03 ENCOUNTER — Ambulatory Visit (INDEPENDENT_AMBULATORY_CARE_PROVIDER_SITE_OTHER): Payer: Medicare Other

## 2016-03-03 DIAGNOSIS — Z23 Encounter for immunization: Secondary | ICD-10-CM

## 2016-03-05 ENCOUNTER — Other Ambulatory Visit: Payer: Self-pay | Admitting: Internal Medicine

## 2016-03-25 ENCOUNTER — Other Ambulatory Visit: Payer: Self-pay | Admitting: Internal Medicine

## 2016-03-25 ENCOUNTER — Ambulatory Visit (INDEPENDENT_AMBULATORY_CARE_PROVIDER_SITE_OTHER): Payer: Medicare Other | Admitting: Internal Medicine

## 2016-03-25 ENCOUNTER — Other Ambulatory Visit: Payer: Medicare Other

## 2016-03-25 ENCOUNTER — Telehealth: Payer: Self-pay

## 2016-03-25 VITALS — BP 124/68 | HR 78 | Resp 20 | Wt 134.0 lb

## 2016-03-25 DIAGNOSIS — E038 Other specified hypothyroidism: Secondary | ICD-10-CM

## 2016-03-25 DIAGNOSIS — R1030 Lower abdominal pain, unspecified: Secondary | ICD-10-CM | POA: Diagnosis not present

## 2016-03-25 DIAGNOSIS — R300 Dysuria: Secondary | ICD-10-CM

## 2016-03-25 DIAGNOSIS — R109 Unspecified abdominal pain: Secondary | ICD-10-CM | POA: Insufficient documentation

## 2016-03-25 DIAGNOSIS — R310 Gross hematuria: Secondary | ICD-10-CM | POA: Insufficient documentation

## 2016-03-25 DIAGNOSIS — E7800 Pure hypercholesterolemia, unspecified: Secondary | ICD-10-CM

## 2016-03-25 MED ORDER — CEPHALEXIN 500 MG PO CAPS: 500.0000 mg | ORAL_CAPSULE | Freq: Three times a day (TID) | ORAL | 0 refills | Status: AC

## 2016-03-25 NOTE — Telephone Encounter (Signed)
Order placed

## 2016-03-25 NOTE — Patient Instructions (Addendum)
Please take all new medication as prescribed - the antibiotic (sent to [facility])  Your specimen culture is pending  If the culture is negative, we should consider Urology referral due to the blood  Please continue all other medications as before, and refills have been done if requested.  Please have the pharmacy call with any other refills you may need.  Please keep your appointments with your specialists as you may have planned

## 2016-03-25 NOTE — Assessment & Plan Note (Signed)
Urine culture pending, if negative will need urology referral

## 2016-03-25 NOTE — Progress Notes (Signed)
Pre visit review using our clinic review tool, if applicable. No additional management support is needed unless otherwise documented below in the visit note. 

## 2016-03-25 NOTE — Progress Notes (Signed)
Subjective:    Patient ID: Elizabeth Mills, female    DOB: 04/11/1945, 71 y.o.   MRN: [id number]  HPI  Here to f/u with acute, c/o several symptoms x 4 days of urinary symptoms such as dysuria, frequency and hematuria without urgency, flank pain, or n/v, fever, chills.  Pt denies new neurological symptoms such as new headache, or facial or extremity weakness or numbness   Pt denies polydipsia, polyuria  Also has right lower back pain mild, sharp, without radiation or LE pain/numbness/weakness.  Past Medical History:  Diagnosis Date  . Allergic rhinitis, cause unspecified   . Asthma   . Back pain   . Colitis   . Diverticulitis   . Generalized headaches   . GERD (gastroesophageal reflux disease)   . Hypercholesterolemia   . Hypothyroidism    Past Surgical History:  Procedure Laterality Date  . ABDOMINAL HYSTERECTOMY  1983  . APPENDECTOMY  1955  . BACK SURGERY    . BREAST BIOPSY  1982  . COLONOSCOPY    . EYE SURGERY  11/14/2009, 1990   blocked tear duct repair  . TONSILLECTOMY  1953    reports that she has never smoked. She has never used smokeless tobacco. She reports that she does not drink alcohol or use drugs. family history includes Arthritis in her father and mother; Cancer in her mother; Hyperlipidemia in her father and mother. Allergies  Allergen Reactions  . Crestor [Rosuvastatin Calcium] Other (See Comments)    Gi issues  . Lipitor [Atorvastatin Calcium]     Gi symp  . Phenergan [Promethazine Hcl] Other (See Comments)    Father and son are highly allergic so patient doesn't take it  . Pravachol Other (See Comments)    Gi issues  . Promethazine Hcl   . Sulfa Antibiotics Other (See Comments)    dizziness  . Zetia [Ezetimibe] Other (See Comments)    Gi issues  . Zocor [Simvastatin - High Dose] Other (See Comments)    Gi issues   Current Outpatient Prescriptions on File Prior to Visit  Medication Sig Dispense Refill  . albuterol (PROAIR HFA) 108 (90 Base)  MCG/ACT inhaler INHALE 2 PUFFS EVERY 4 HOURS AS NEEDED FOR WHEEZING OR SHORTNESS OF BREATH Yearly physical due in Dec 3 each 0  . aspirin 81 MG tablet Take 81 mg by mouth as needed (blood thinner).     Marland Kitchen. estradiol (ESTRACE) 0.1 MG/GM vaginal cream Place 1 Applicatorful vaginally 3 (three) times a week. (Patient taking differently: Place 1 g vaginally 3 (three) times a week. ) 127.5 g 3  . fenofibrate 160 MG tablet Take 1 tablet (160 mg total) by mouth daily. 90 tablet 3  . fluticasone (FLONASE) 50 MCG/ACT nasal spray PLACE 2 SPRAYS INTO BOTH NOSTRILS DAILY AS NEEDED FOR ALLERGIES OR RHINITIS (ALLERGIES). 16 g 2  . Fluticasone-Salmeterol (ADVAIR DISKUS) 250-50 MCG/DOSE AEPB USE 1 INHALATION EVERY 12 HOURS 180 each 3  . levothyroxine (SYNTHROID, LEVOTHROID) 75 MCG tablet Take 1 tablet (75 mcg total) by mouth daily. 90 tablet 2  . loratadine (CLARITIN) 10 MG tablet Take 10 mg by mouth daily as needed for allergies (allergies).    . metoprolol succinate (TOPROL-XL) 25 MG 24 hr tablet TAKE ONE-HALF (1/2) TABLET DAILY 45 tablet 1   No current facility-administered medications on file prior to visit.    Review of Systems .All otherwise neg per pt     Objective:   Physical Exam BP 124/68   Pulse 78  Resp 20   Wt 134 lb (60.8 kg)   SpO2 96%   BMI 24.51 kg/m  VS noted, mild ill Constitutional: Pt appears in no apparent distress HENT: Head: NCAT.  Right Ear: External ear normal.  Left Ear: External ear normal.  Eyes: . Pupils are equal, round, and reactive to light. Conjunctivae and EOM are normal Neck: Normal range of motion. Neck supple.  Cardiovascular: Normal rate and regular rhythm.   Pulmonary/Chest: Effort normal and breath sounds without rales or wheezing.  Abd:  Soft, with low mid abd tender,, ND, + BS, no guarding or rebound, no flank tender Neurological: Pt is alert. Not confused , motor grossly intact Skin: Skin is warm. No rash, no LE edema Psychiatric: Pt behavior is normal.  No agitation.         Assessment & Plan:

## 2016-03-25 NOTE — Assessment & Plan Note (Signed)
C/w prob cysitis, Mild to mod, for antibx course,  to f/u any worsening symptoms or concerns, urine culture pending,

## 2016-03-26 ENCOUNTER — Other Ambulatory Visit: Payer: Self-pay | Admitting: Cardiology

## 2016-03-26 ENCOUNTER — Encounter: Payer: Self-pay | Admitting: *Deleted

## 2016-03-26 MED ORDER — METOPROLOL SUCCINATE ER 25 MG PO TB24: 12.5000 mg | ORAL_TABLET | Freq: Every day | ORAL | 0 refills | Status: DC

## 2016-03-26 NOTE — Telephone Encounter (Signed)
Yes, please refill only up to her follow-up appt with Dr Delton SeeNelson in Feb.   Thanks!

## 2016-03-26 NOTE — Telephone Encounter (Signed)
New Message   *STAT* If patient is at the pharmacy, call can be transferred to refill team.   1. Which medications need to be refilled? (please list name of each medication and dose if known)   metoprolol succinate (TOPROL-XL) 25 MG 24 hr tablet     2. Which pharmacy/location (including street and city if local pharmacy) is medication to be sent to? [facility]  3. Do they need a 30 day or 90 day supply? 90 day

## 2016-03-28 LAB — CULTURE, URINE COMPREHENSIVE

## 2016-04-24 ENCOUNTER — Other Ambulatory Visit: Payer: Medicare Other

## 2016-04-25 ENCOUNTER — Other Ambulatory Visit: Payer: Medicare Other | Admitting: *Deleted

## 2016-04-25 DIAGNOSIS — E7800 Pure hypercholesterolemia, unspecified: Secondary | ICD-10-CM

## 2016-04-25 DIAGNOSIS — E02 Subclinical iodine-deficiency hypothyroidism: Secondary | ICD-10-CM

## 2016-04-25 LAB — CBC WITH DIFFERENTIAL/PLATELET
Basophils Absolute: 0 10*3/uL (ref 0.0–0.2)
Basos: 1 %
EOS (ABSOLUTE): 0.3 10*3/uL (ref 0.0–0.4)
Eos: 7 %
Hematocrit: 38.3 % (ref 34.0–46.6)
Hemoglobin: 13.3 g/dL (ref 11.1–15.9)
Immature Grans (Abs): 0 10*3/uL (ref 0.0–0.1)
Immature Granulocytes: 1 %
Lymphocytes Absolute: 1.5 10*3/uL (ref 0.7–3.1)
Lymphs: 35 %
MCH: 30.9 pg (ref 26.6–33.0)
MCHC: 34.7 g/dL (ref 31.5–35.7)
MCV: 89 fL (ref 79–97)
Monocytes Absolute: 0.5 10*3/uL (ref 0.1–0.9)
Monocytes: 12 %
Neutrophils Absolute: 1.9 10*3/uL (ref 1.4–7.0)
Neutrophils: 44 %
Platelets: 299 10*3/uL (ref 150–379)
RBC: 4.3 x10E6/uL (ref 3.77–5.28)
RDW: 13.6 % (ref 12.3–15.4)
WBC: 4.3 10*3/uL (ref 3.4–10.8)

## 2016-04-25 LAB — TSH: TSH: 1.2 u[IU]/mL (ref 0.450–4.500)

## 2016-04-25 LAB — LIPID PANEL
Chol/HDL Ratio: 2 ratio units (ref 0.0–4.4)
Cholesterol, Total: 191 mg/dL (ref 100–199)
HDL: 94 mg/dL (ref >39)
LDL Calculated: 85 mg/dL (ref 0–99)
Triglycerides: 61 mg/dL (ref 0–149)
VLDL Cholesterol Cal: 12 mg/dL (ref 5–40)

## 2016-04-25 LAB — COMPREHENSIVE METABOLIC PANEL
ALT: 20 IU/L (ref 0–32)
AST: 27 IU/L (ref 0–40)
Albumin/Globulin Ratio: 1.5 (ref 1.2–2.2)
Albumin: 4 g/dL (ref 3.5–4.8)
Alkaline Phosphatase: 39 IU/L (ref 39–117)
BUN/Creatinine Ratio: 31 — ABNORMAL HIGH (ref 12–28)
BUN: 20 mg/dL (ref 8–27)
Bilirubin Total: 0.5 mg/dL (ref 0.0–1.2)
CO2: 22 mmol/L (ref 18–29)
Calcium: 9.3 mg/dL (ref 8.7–10.3)
Chloride: 102 mmol/L (ref 96–106)
Creatinine, Ser: 0.64 mg/dL (ref 0.57–1.00)
GFR calc Af Amer: 105 mL/min/{1.73_m2} (ref >59)
GFR calc non Af Amer: 91 mL/min/{1.73_m2} (ref >59)
Globulin, Total: 2.7 g/dL (ref 1.5–4.5)
Glucose: 89 mg/dL (ref 65–99)
Potassium: 4.5 mmol/L (ref 3.5–5.2)
Sodium: 141 mmol/L (ref 134–144)
Total Protein: 6.7 g/dL (ref 6.0–8.5)

## 2016-04-25 NOTE — Addendum Note (Signed)
Addended by: Tonita PhoenixBOWDEN, Yotam Rhine K on: 04/25/2016 08:13 AM   Modules accepted: Orders

## 2016-04-28 ENCOUNTER — Other Ambulatory Visit: Payer: Medicare Other

## 2016-04-28 ENCOUNTER — Other Ambulatory Visit: Payer: Self-pay

## 2016-04-28 ENCOUNTER — Encounter: Payer: Self-pay | Admitting: Nurse Practitioner

## 2016-04-28 ENCOUNTER — Ambulatory Visit (INDEPENDENT_AMBULATORY_CARE_PROVIDER_SITE_OTHER)
Admission: RE | Admit: 2016-04-28 | Discharge: 2016-04-28 | Disposition: A | Discharge status: Home / Self Care | Payer: Medicare Other | Source: Ambulatory Visit | Attending: Nurse Practitioner | Admitting: Nurse Practitioner

## 2016-04-28 ENCOUNTER — Ambulatory Visit (INDEPENDENT_AMBULATORY_CARE_PROVIDER_SITE_OTHER): Payer: Medicare Other | Admitting: Nurse Practitioner

## 2016-04-28 DIAGNOSIS — R300 Dysuria: Secondary | ICD-10-CM

## 2016-04-28 DIAGNOSIS — R1030 Lower abdominal pain, unspecified: Secondary | ICD-10-CM

## 2016-04-28 DIAGNOSIS — N200 Calculus of kidney: Secondary | ICD-10-CM

## 2016-04-28 DIAGNOSIS — R8290 Unspecified abnormal findings in urine: Secondary | ICD-10-CM

## 2016-04-28 DIAGNOSIS — R310 Gross hematuria: Secondary | ICD-10-CM

## 2016-04-28 LAB — POCT URINALYSIS DIPSTICK
BILIRUBIN UA: NEGATIVE
Glucose, UA: NEGATIVE
KETONES UA: NEGATIVE
LEUKOCYTES UA: NEGATIVE
Nitrite, UA: NEGATIVE
PH UA: 6.5
PROTEIN UA: 15
RBC UA: 3
SPEC GRAV UA: 1.025
Urobilinogen, UA: 0.2

## 2016-04-28 MED ORDER — NITROFURANTOIN MONOHYD MACRO 100 MG PO CAPS: 100.0000 mg | ORAL_CAPSULE | Freq: Two times a day (BID) | ORAL | 0 refills | Status: DC

## 2016-04-28 MED ORDER — PHENAZOPYRIDINE HCL 100 MG PO TABS: 100.0000 mg | ORAL_TABLET | Freq: Three times a day (TID) | ORAL | 0 refills | Status: DC | PRN

## 2016-04-28 MED ORDER — NITROFURANTOIN MONOHYD MACRO 100 MG PO CAPS: 100.0000 mg | ORAL_CAPSULE | Freq: Two times a day (BID) | ORAL | 0 refills | Status: AC

## 2016-04-28 NOTE — Progress Notes (Signed)
Pre visit review using our clinic review tool, if applicable. No additional management support is needed unless otherwise documented below in the visit note. 

## 2016-04-28 NOTE — Patient Instructions (Signed)
Stop use of aspirin or any NSAIDs.  Refer to urologist if normal KUB. Hematuria, Adult Hematuria is blood in your urine. It can be caused by a bladder infection, kidney infection, prostate infection, kidney stone, or cancer of your urinary tract. Infections can usually be treated with medicine, and a kidney stone usually will pass through your urine. If neither of these is the cause of your hematuria, further workup to find out the reason may be needed. It is very important that you tell your health care provider about any blood you see in your urine, even if the blood stops without treatment or happens without causing pain. Blood in your urine that happens and then stops and then happens again can be a symptom of a very serious condition. Also, pain is not a symptom in the initial stages of many urinary cancers. Follow these instructions at home:  Drink lots of fluid, 3-4 quarts a day. If you have been diagnosed with an infection, cranberry juice is especially recommended, in addition to large amounts of water.  Avoid caffeine, tea, and carbonated beverages because they tend to irritate the bladder.  Avoid alcohol because it may irritate the prostate.  Take all medicines as directed by your health care provider.  If you were prescribed an antibiotic medicine, finish it all even if you start to feel better.  If you have been diagnosed with a kidney stone, follow your health care provider's instructions regarding straining your urine to catch the stone.  Empty your bladder often. Avoid holding urine for long periods of time.  After a bowel movement, women should cleanse front to back. Use each tissue only once.  Empty your bladder before and after sexual intercourse if you are a female. Contact a health care provider if:  You develop back pain.  You have a fever.  You have a feeling of sickness in your stomach (nausea) or vomiting.  Your symptoms are not better in 3 days. Return  sooner if you are getting worse. Get help right away if:  You develop severe vomiting and are unable to keep the medicine down.  You develop severe back or abdominal pain despite taking your medicines.  You begin passing a large amount of blood or clots in your urine.  You feel extremely weak or faint, or you pass out. This information is not intended to replace advice given to you by your health care provider. Make sure you discuss any questions you have with your health care provider. Document Released: 03/10/2005 Document Revised: 08/16/2015 Document Reviewed: 11/08/2012 Elsevier Interactive Patient Education  2017 [employer]

## 2016-04-28 NOTE — Progress Notes (Signed)
Subjective:  Patient ID: Elizabeth Mills, female    DOB: [birth year]  Age: 71 y.o. 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 The current episode started 1 to 4 weeks ago. The problem has been waxing and waning since onset. She describes the hematuria as gross hematuria. The hematuria occurs throughout her entire urinary stream. She reports no clotting in her urine stream. The pain is mild. She describes her urine color as light pink. Irritative symptoms include frequency and urgency. Irritative symptoms do not include nocturia. Obstructive symptoms do not include dribbling, incomplete emptying, an intermittent stream, a slower stream, straining or a weak stream. Associated symptoms include abdominal pain and dysuria. Pertinent negatives include no chills, facial swelling, fever, flank pain, genital pain, hesitancy, inability to urinate or nausea. She is not sexually active.    Outpatient Medications Prior to Visit  Medication Sig Dispense Refill  . albuterol (PROAIR HFA) 108 (90 Base) MCG/ACT inhaler INHALE 2 PUFFS EVERY 4 HOURS AS NEEDED FOR WHEEZING OR SHORTNESS OF BREATH Yearly physical due in Dec 3 each 0  . aspirin 81 MG tablet Take 81 mg by mouth as needed (blood thinner).     Marland Kitchen estradiol (ESTRACE) 0.1 MG/GM vaginal cream Place 1 Applicatorful vaginally 3 (three) times a week. (Patient taking differently: Place 1 g vaginally 3 (three) times a week. ) 127.5 g 3  . fenofibrate 160 MG tablet Take 1 tablet (160 mg total) by mouth daily. 90 tablet 3  . fluticasone (FLONASE) 50 MCG/ACT nasal spray PLACE 2 SPRAYS INTO BOTH NOSTRILS DAILY AS NEEDED FOR ALLERGIES OR RHINITIS (ALLERGIES). 16 g 2  .  Fluticasone-Salmeterol (ADVAIR DISKUS) 250-50 MCG/DOSE AEPB USE 1 INHALATION EVERY 12 HOURS 180 each 3  . levothyroxine (SYNTHROID, LEVOTHROID) 75 MCG tablet Take 1 tablet (75 mcg total) by mouth daily. 90 tablet 2  . loratadine (CLARITIN) 10 MG tablet Take 10 mg by mouth daily as needed for allergies (allergies).    . metoprolol succinate (TOPROL-XL) 25 MG 24 hr tablet Take 0.5 tablets (12.5 mg total) by mouth daily. 30 tablet 0   No facility-administered medications prior to visit.     ROS See HPI  Objective:  There were no vitals taken for this visit.  BP Readings from Last 3 Encounters:  03/25/16 124/68  11/08/15 120/60  08/28/15 110/60    Wt Readings from Last 3 Encounters:  03/25/16 134 lb (60.8 kg)  11/08/15 134 lb (60.8 kg)  08/28/15 131 lb (59.4 kg)    Physical Exam  Constitutional: She is oriented to person, place, and time. No distress.  Cardiovascular: Normal rate.   Pulmonary/Chest: Effort normal.  Abdominal: Soft. Bowel sounds are normal. She exhibits no distension and no mass. There is tenderness. There is no rebound and no guarding.  Neurological: She is alert and oriented to person, place, and time.  Skin: Skin is warm and dry.  Vitals reviewed.   Lab Results  Component Value Date   WBC 4.3 04/25/2016   HGB 14.4 03/22/2015   HCT 38.3 04/25/2016   PLT 299 04/25/2016   GLUCOSE 89 04/25/2016   CHOL 191 04/25/2016   TRIG 61 04/25/2016   HDL 94 04/25/2016  LDLDIRECT 94.2 07/28/2012   LDLCALC 85 04/25/2016   ALT 20 04/25/2016   AST 27 04/25/2016   NA 141 04/25/2016   K 4.5 04/25/2016   CL 102 04/25/2016   CREATININE 0.64 04/25/2016   BUN 20 04/25/2016   CO2 22 04/25/2016   TSH 1.200 04/25/2016    Dg Bone Density  Result Date: 06/27/2015 Date of study: 06/22/2015 Exam: DUAL X-RAY ABSORPTIOMETRY (DXA) FOR BONE MINERAL DENSITY (BMD) Instrument: [employer] Therapist, [facility] Requesting Provider: PCP Indication: follow up for low BMD Comparison: none (please  note that it is not possible to compare data from different instruments) Clinical data: Pt is a postmenopausal 71 y.o. female without previous h/o fracture. Results:  Lumbar spine (L1-L4) Femoral neck (FN) T-score -0.8  RFN:-1.5 LFN: -1.2  Assessment: the BMD is low according to the [facility] classification for osteoporosis (see below). Fracture risk: moderate FRAX score: 10 year major osteoporotic risk: 9.7 %. 10 year hip fracture risk: 1.5 %. These are under the thresholds for treatment of 20% and 3%, respectively. Comments: the technical quality of the study is good, however, the spine is scoliotic and arthritic. Calcium accumulation in arthritic sites can confound the results of the bone density scan. Evaluation for secondary causes should be considered if clinically indicated. Recommend optimizing calcium (1200 mg/day) and vitamin D (800 IU/day) intake. Followup: Repeat BMD is appropriate after 2 years. WHO criteria for diagnosis of osteoporosis in postmenopausal women and in men 71 y/o or older: - normal: T-score -1.0 to + 1.0 - osteopenia/low bone density: T-score between -2.5 and -1.0 - osteoporosis: T-score below -2.5 - severe osteoporosis: T-score below -2.5 with history of fragility fracture Note: although not part of the WHO classification, the presence of a fragility fracture, regardless of the T-score, should be considered diagnostic of osteoporosis, provided other causes for the fracture have been excluded. Treatment: The National Osteoporosis Foundation recommends that treatment be considered in postmenopausal women and men age 71 or older with: 1. Hip or vertebral (clinical or morphometric) fracture 2. T-score of - 2.5 or lower at the spine or hip 3. 10-year fracture probability by FRAX of at least 20% for a major osteoporotic fracture and 3% for a hip fracture [name], MD [facility]    Assessment & Plan:   Elizabeth Manislizabeth was seen today for urinary tract infection.  Diagnoses and all  orders for this visit:  Gross hematuria -     phenazopyridine (PYRIDIUM) 100 MG tablet; Take 1 tablet (100 mg total) by mouth 3 (three) times daily as needed for pain (with food). -     Ambulatory referral to Urology -     nitrofurantoin, macrocrystal-monohydrate, (MACROBID) 100 MG capsule; Take 1 capsule (100 mg total) by mouth 2 (two) times daily.  Dysuria -     POCT urinalysis dipstick -     DG Abd 1 View; Future -     Urine culture -     phenazopyridine (PYRIDIUM) 100 MG tablet; Take 1 tablet (100 mg total) by mouth 3 (three) times daily as needed for pain (with food). -     Ambulatory referral to Urology -     nitrofurantoin, macrocrystal-monohydrate, (MACROBID) 100 MG capsule; Take 1 capsule (100 mg total) by mouth 2 (two) times daily.  Lower abdominal pain -     POCT urinalysis dipstick -     DG Abd 1 View; Future -     Urine culture -     phenazopyridine (PYRIDIUM) 100 MG tablet; Take  1 tablet (100 mg total) by mouth 3 (three) times daily as needed for pain (with food). -     Ambulatory referral to Urology  Renal calculi -     Ambulatory referral to Urology -     nitrofurantoin, macrocrystal-monohydrate, (MACROBID) 100 MG capsule; Take 1 capsule (100 mg total) by mouth 2 (two) times daily.   I am having Elizabeth Mills start on phenazopyridine and nitrofurantoin (macrocrystal-monohydrate). I am also having her maintain her aspirin, loratadine, Fluticasone-Salmeterol, fenofibrate, levothyroxine, estradiol, albuterol, fluticasone, and metoprolol succinate.  Meds ordered this encounter  Medications  . phenazopyridine (PYRIDIUM) 100 MG tablet    Sig: Take 1 tablet (100 mg total) by mouth 3 (three) times daily as needed for pain (with food).    Dispense:  10 tablet    Refill:  0    Order Specific Question:   Supervising Provider    Answer:   Tresa Garter [[id number]]  . nitrofurantoin, macrocrystal-monohydrate, (MACROBID) 100 MG capsule    Sig: Take 1 capsule (100 mg total) by  mouth 2 (two) times daily.    Dispense:  20 capsule    Refill:  0    Order Specific Question:   Supervising Provider    Answer:   Tresa Garter [[id number]]    Follow-up: Return if symptoms worsen or fail to improve.  Alysia Penna, NP

## 2016-04-29 LAB — URINE CULTURE: ORGANISM ID, BACTERIA: NO GROWTH

## 2016-05-07 ENCOUNTER — Ambulatory Visit: Payer: Medicare Other | Admitting: Cardiology

## 2016-05-08 ENCOUNTER — Encounter: Payer: Self-pay | Admitting: Nurse Practitioner

## 2016-05-13 ENCOUNTER — Encounter: Payer: Self-pay | Admitting: Cardiology

## 2016-05-13 ENCOUNTER — Ambulatory Visit (INDEPENDENT_AMBULATORY_CARE_PROVIDER_SITE_OTHER): Payer: Medicare Other | Admitting: Cardiology

## 2016-05-13 VITALS — BP 114/68 | HR 75 | Ht 62.0 in | Wt 132.4 lb

## 2016-05-13 DIAGNOSIS — I471 Supraventricular tachycardia: Secondary | ICD-10-CM

## 2016-05-13 DIAGNOSIS — I10 Essential (primary) hypertension: Secondary | ICD-10-CM

## 2016-05-13 DIAGNOSIS — E7800 Pure hypercholesterolemia, unspecified: Secondary | ICD-10-CM

## 2016-05-13 MED ORDER — METOPROLOL SUCCINATE ER 25 MG PO TB24: 12.5000 mg | ORAL_TABLET | Freq: Every day | ORAL | 2 refills | Status: DC

## 2016-05-13 NOTE — Progress Notes (Signed)
Cardiology Office Note   Date:  05/13/2016   ID:  Elizabeth Mills, DOB 1945-11-01, MRN [id number]  PCP:  Pincus SanesStacy J Burns, MD  Cardiologist: Cassell Clementhomas Brackbill MD --Tobias Alexander>Dorothymae Maciver  Chief complain: Six-month follow-up   History of Present Illness: Elizabeth Mills is a 71 y.o. female who presents for a six-month follow-up , this is a very pleasant lady who has been followed by Dr. Patty SermonsBrackbill for many years for hyper lipidemia hypothyroidism and history of palpitations. She does not have any history of ischemic heart disease and she had normal nuclear stress test in 2007. She had an echocardiogram in December 2008 which showed an ejection fraction of 60-65% with impaired relaxation and normal LV systolic function.  She has intermittent episodic episodes of palpitations that responds to metoprolol, date a cure approximately twice a year. Since the last visit she had only one episode a few days ago to last a few seconds. These happen after she had caffeineted drink. She stays active but recently she has gained some weight as she was not careful about her diet.  05/13/2016 - patient is coming after 6 months, she has been doing well other than stress in her family. She is trying to lose weight but unable to loose. She has been real compliant with healthy diet and her most recent lipids are all excellent. She denies any chest pain or shortness of breath palpitation recently no lower extremity edema orthopnea or proximal nocturnal dyspnea. He complains of feeling tired.  Past Medical History:  Diagnosis Date  . Allergic rhinitis, cause unspecified   . Asthma   . Back pain   . Colitis   . Diverticulitis   . Generalized headaches   . GERD (gastroesophageal reflux disease)   . Hypercholesterolemia   . Hypothyroidism    Past Surgical History:  Procedure Laterality Date  . ABDOMINAL HYSTERECTOMY  1983  . APPENDECTOMY  1955  . BACK SURGERY    . BREAST BIOPSY  1982  . COLONOSCOPY    . EYE  SURGERY  11/14/2009, 1990   blocked tear duct repair  . TONSILLECTOMY  1953    Current Outpatient Prescriptions  Medication Sig Dispense Refill  . albuterol (PROAIR HFA) 108 (90 Base) MCG/ACT inhaler INHALE 2 PUFFS EVERY 4 HOURS AS NEEDED FOR WHEEZING OR SHORTNESS OF BREATH Yearly physical due in Dec 3 each 0  . aspirin 81 MG tablet Take 81 mg by mouth as needed (blood thinner).     Marland Kitchen. estradiol (ESTRACE) 0.1 MG/GM vaginal cream Place 1 Applicatorful vaginally once a week.    . fenofibrate 160 MG tablet Take 160 mg by mouth 4 (four) times a week.    . fluticasone (FLONASE) 50 MCG/ACT nasal spray PLACE 2 SPRAYS INTO BOTH NOSTRILS DAILY AS NEEDED FOR ALLERGIES OR RHINITIS (ALLERGIES). 16 g 2  . Fluticasone-Salmeterol (ADVAIR DISKUS) 250-50 MCG/DOSE AEPB USE 1 INHALATION EVERY 12 HOURS 180 each 3  . levothyroxine (SYNTHROID, LEVOTHROID) 75 MCG tablet Take 1 tablet (75 mcg total) by mouth daily. 90 tablet 2  . loratadine (CLARITIN) 10 MG tablet Take 10 mg by mouth daily as needed for allergies (allergies).    . metoprolol succinate (TOPROL-XL) 25 MG 24 hr tablet Take 0.5 tablets (12.5 mg total) by mouth daily. 30 tablet 0   No current facility-administered medications for this visit.     Allergies:   Crestor [rosuvastatin calcium]; Lipitor [atorvastatin calcium]; Phenergan [promethazine hcl]; Pravachol; Promethazine; Promethazine hcl; Sulfa antibiotics; Sulfamethoxazole; Zetia [ezetimibe];  and Zocor [simvastatin - high dose]    Social History:  The patient  reports that she has never smoked. She has never used smokeless tobacco. She reports that she does not drink alcohol or use drugs.   Family History:  The patient's family history includes Arthritis in her father and mother; Cancer in her mother; Hyperlipidemia in her father and mother.    ROS:  Please see the history of present illness.   Otherwise, review of systems are positive for none.   All other systems are reviewed and negative.     PHYSICAL EXAM: VS:  BP 114/68   Pulse 75   Ht 5\' 2"  (1.575 m)   Wt 132 lb 6.4 oz (60.1 kg)   BMI 24.22 kg/m  , BMI Body mass index is 24.22 kg/m. GEN: Well nourished, well developed, in no acute distress  HEENT: normal  Neck: no JVD, carotid bruits, or masses Cardiac: RRR; no murmurs, rubs, or gallops,no edema  Respiratory:  clear to auscultation bilaterally, normal work of breathing GI: soft, nontender, nondistended, + BS MS: no deformity or atrophy  Skin: warm and dry, no rash Neuro:  Strength and sensation are intact Psych: euthymic mood, full affect   EKG:  EKG is not ordered today.    Recent Labs: 04/25/2016: ALT 20; BUN 20; Creatinine, Ser 0.64; Platelets 299; Potassium 4.5; Sodium 141; TSH 1.200    Lipid Panel    Component Value Date/Time   CHOL 191 04/25/2016 0813   TRIG 61 04/25/2016 0813   HDL 94 04/25/2016 0813   CHOLHDL 2.0 04/25/2016 0813   CHOLHDL 2.5 04/26/2015 0939   VLDL 12 04/26/2015 0939   LDLCALC 85 04/25/2016 0813   LDLDIRECT 94.2 07/28/2012 1010   Wt Readings from Last 3 Encounters:  05/13/16 132 lb 6.4 oz (60.1 kg)  03/25/16 134 lb (60.8 kg)  11/08/15 134 lb (60.8 kg)    EKG - reviewed personally shows normal sinus rate and normal EKG and is unchanged from prior.  ASSESSMENT AND PLAN: 1. Hypercholesterolemia - all lipids excellent with HDL 94, LDL 84, and triglycerides 61 she is congratulated on excellent effort. 2. Palpitations - no recent palpitations continue current regimen. 3. Hypothyroidism - TSH 1.2, continue the same regimen, for fatigue she is advised to start exercising again.   Orders Placed This Encounter  Procedures  . EKG 12-Lead   Follow-up in 6 months.  Virgel Manifold 05/13/2016 10:12 AM    [facility] [address] [place], [place], [place]  [address] Phone: [phone]; Fax: [phone]

## 2016-05-13 NOTE — Patient Instructions (Addendum)

## 2016-06-05 ENCOUNTER — Other Ambulatory Visit: Payer: Self-pay | Admitting: Internal Medicine

## 2016-06-09 ENCOUNTER — Other Ambulatory Visit: Payer: Self-pay | Admitting: Internal Medicine

## 2016-06-21 ENCOUNTER — Ambulatory Visit (INDEPENDENT_AMBULATORY_CARE_PROVIDER_SITE_OTHER): Payer: Medicare Other | Admitting: Family Medicine

## 2016-06-21 ENCOUNTER — Encounter: Payer: Self-pay | Admitting: Family Medicine

## 2016-06-21 VITALS — BP 122/70 | HR 74 | Temp 98.1°F | Resp 16 | Ht 62.0 in | Wt 133.0 lb

## 2016-06-21 DIAGNOSIS — J40 Bronchitis, not specified as acute or chronic: Secondary | ICD-10-CM

## 2016-06-21 MED ORDER — DOXYCYCLINE HYCLATE 100 MG PO CAPS: 100.0000 mg | ORAL_CAPSULE | Freq: Two times a day (BID) | ORAL | 0 refills | Status: DC

## 2016-06-21 NOTE — Progress Notes (Signed)
[facility] [address], [address] [place], [place] [address] [phone]  Date:  06/21/2016   Name:  Elizabeth Mills   DOB:  1946-03-14   MRN:  [id number]  PCP:  Pincus Sanes, MD    Chief Complaint: Cough (congestion, left ear pain, hoarseness, low grade fever x 1 week )   History of Present Illness:  Elizabeth Mills is a 71 y.o. very pleasant female patient who presents with the following:  Here today with illness she noted onset of laryngitis and throat congestion. She has noted sx for a little over a week She notes a significant cough and she is bringing up come mucus She notes a "slight fever" yesterday- her temp was 99.5  Her brother is quite ill and may be dying- she wants to get treatment so she can get to [facility] to see him.  She plans to drive as she is afraid of flying  No vomiting or diearrha.  She has had a little nausea  Her left ear was hurting the last couple of days but is ok now- she started using nasocort and heat- this helped her some She has a history of frequent ear infections in her youth and barotrauma to the right ear, surgery of some sort on the left ear  No ST She has rare atrial fib and uses methoprolol for this - she has not noted any recent sx of same Notes that lowish BP is normal for her- she has not noted any feeling of lightheadedness   BP Readings from Last 3 Encounters:  06/21/16 92/70  05/13/16 114/68  03/25/16 124/68     Patient Active Problem List   Diagnosis Date Noted  . Abdominal pain 03/25/2016  . Gross hematuria 03/25/2016  . Osteopenia 06/27/2015  . Cough 02/20/2015  . Wheezing 02/20/2015  . Amblyopia of right eye 03/07/2014  . Nuclear sclerosis of both eyes 03/07/2014  . Hip bursitis 01/25/2014  . Rapid palpitations 07/28/2012  . Alopecia 07/28/2012  . Amblyopia 02/04/2012  . Nuclear cataract 02/04/2012  . Asthma   . Allergic rhinitis   . Hypercholesterolemia    . Back pain   . Colitis   . Hypothyroidism     Past Medical History:  Diagnosis Date  . Allergic rhinitis, cause unspecified   . Asthma   . Back pain   . Colitis   . Diverticulitis   . Generalized headaches   . GERD (gastroesophageal reflux disease)   . Hypercholesterolemia   . Hypothyroidism     Past Surgical History:  Procedure Laterality Date  . ABDOMINAL HYSTERECTOMY  1983  . APPENDECTOMY  1955  . BACK SURGERY    . BREAST BIOPSY  1982  . COLONOSCOPY    . EYE SURGERY  11/14/2009, 1990   blocked tear duct repair  . TONSILLECTOMY  1953    Social History  Substance Use Topics  . Smoking status: Never Smoker  . Smokeless tobacco: Never Used     Comment: married, lives with spouse - retired  . Alcohol use No    Family History  Problem Relation Age of Onset  . Cancer Mother     Kidney and Ovarian Cancer  . Arthritis Mother   . Hyperlipidemia Mother   . Arthritis Father   . Hyperlipidemia Father     Allergies  Allergen Reactions  . Crestor [Rosuvastatin Calcium] Other (See Comments)    Gi issues  .  Lipitor [Atorvastatin Calcium]     Gi symp  . Phenergan [Promethazine Hcl] Other (See Comments)    Father and son are highly allergic so patient doesn't take it  . Pravachol Other (See Comments)    Gi issues  . Promethazine     Other reaction(s): Other (See Comments) Father and son have AMS with Phenergan - patient afraid to take  . Promethazine Hcl   . Sulfa Antibiotics Other (See Comments)    dizziness  . Sulfamethoxazole     Other reaction(s): Dizziness (intolerance) Disoriented  . Zetia [Ezetimibe] Other (See Comments)    Gi issues  . Zocor [Simvastatin - High Dose] Other (See Comments)    Gi issues    Medication list has been reviewed and updated.  Current Outpatient Prescriptions on File Prior to Visit  Medication Sig Dispense Refill  . albuterol (PROAIR HFA) 108 (90 Base) MCG/ACT inhaler INHALE 2 PUFFS EVERY 4 HOURS AS NEEDED FOR WHEEZING OR  SHORTNESS OF BREATH Yearly physical due in Dec 3 each 0  . aspirin 81 MG tablet Take 81 mg by mouth as needed (blood thinner).     Marland Kitchen estradiol (ESTRACE) 0.1 MG/GM vaginal cream Place 1 Applicatorful vaginally once a week.    . fenofibrate 160 MG tablet Take 160 mg by mouth 4 (four) times a week.    . fluticasone (FLONASE) 50 MCG/ACT nasal spray PLACE 2 SPRAYS INTO BOTH NOSTRILS DAILY AS NEEDED FOR ALLERGIES OR RHINITIS (ALLERGIES). 16 g 2  . Fluticasone-Salmeterol (ADVAIR DISKUS) 250-50 MCG/DOSE AEPB Inhale 1 puff into the lungs every 12 (twelve) hours. -- appt needed with PCP for further refills. 180 each 3  . levothyroxine (SYNTHROID, LEVOTHROID) 75 MCG tablet Take 1 tablet (75 mcg total) by mouth daily. --- Office visit needed for further refills 90 tablet 0  . loratadine (CLARITIN) 10 MG tablet Take 10 mg by mouth daily as needed for allergies (allergies).    . metoprolol succinate (TOPROL-XL) 25 MG 24 hr tablet Take 0.5 tablets (12.5 mg total) by mouth daily. 90 tablet 2   No current facility-administered medications on file prior to visit.     Review of Systems:  As per HPI- otherwise negative.   Physical Examination: Vitals:   06/21/16 1011  BP: 92/70  Pulse: 74  Resp: 16  Temp: 98.1 F (36.7 C)   Vitals:   06/21/16 1011  Weight: 133 lb (60.3 kg)  Height:  (1.575 m)   Body mass index is 24.33 kg/m. Ideal Body Weight: Weight in (lb) to have BMI = 25: 136.4  GEN: WDWN, NAD, Non-toxic, A & O x 3, normal weight, looks well HEENT: Atraumatic, Normocephalic. Neck supple. No masses, No LAD.  Left TM shows post- operative changed.  The right TM is not red or inflamed, but shows chronic appearing scarring and thickening, oropharynx normal.  PEERL,EOMI.   Ears and Nose: No external deformity. CV: RRR, No M/G/R. No JVD. No thrill. No extra heart sounds. PULM: CTA B, no wheezes, crackles, rhonchi. No retractions. No resp. distress. No accessory muscle use. ABD: S, NT, ND,  +BS. No rebound. No HSM. EXTR: No c/c/e NEURO Normal gait.  PSYCH: Normally interactive. Conversant. Not depressed or anxious appearing.  Calm demeanor.    Assessment and Plan: Bronchitis - Plan: doxycycline (VIBRAMYCIN) 100 MG capsule  Here today with sx of bronchitis- will treat with doxycycline as she needs to travel to see a dying family member Asked her to please seek care if not  improving soon and she agrees  At this time I do not appreciate any sign of AOM  Signed Abbe Amsterdam, MD

## 2016-06-21 NOTE — Patient Instructions (Addendum)
It was nice to see you today I hope that you feel better soon- we are going to treat you with doxycycline antibiotic.  Take this with food and water.  Please let us know if you are not feeling better in the next 2-3 days- Sooner if worse.

## 2016-06-21 NOTE — Progress Notes (Signed)
Pre visit review using our clinic review tool, if applicable. No additional management support is needed unless otherwise documented below in the visit note. 

## 2016-09-03 ENCOUNTER — Other Ambulatory Visit: Payer: Self-pay | Admitting: Internal Medicine

## 2016-09-12 ENCOUNTER — Other Ambulatory Visit: Payer: Self-pay | Admitting: Internal Medicine

## 2016-09-19 ENCOUNTER — Ambulatory Visit: Payer: Medicare Other | Admitting: Internal Medicine

## 2016-09-20 NOTE — Progress Notes (Signed)
Subjective:    Patient ID: Elizabeth Mills, female    DOB: Jul 05, 1945, 71 y.o.   MRN: [id number]  HPI The patient is here for follow up.  Asthma:  She uses the advair twice daily.  She uses the albuterol 2 times a day now.  She uses the albuterol more with hot/humid weather. She has occasional wheeze/cough, but denies SOB.  Overall, she feels her asthma is well controlled.    Hypothyroidism:  She is taking her medication daily.  She denies any recent changes in energy or weight that are unexplained. She does want to change to synthroid - she sees fluctuations in her hair and wonders if the trade will be more consistent.    Hyperlipidemia: She is taking her medication daily. She is compliant with a low fat/cholesterol diet. She is exercising. She denies myalgias.   She has a kidney stone and is concerned about her kidney function.  She has seen urology.  She notices decreased urination at times.  She denies dysuria and hematuria.    Medications and allergies reviewed with patient and updated if appropriate.  Patient Active Problem List   Diagnosis Date Noted  . Abdominal pain 03/25/2016  . Gross hematuria 03/25/2016  . Osteopenia 06/27/2015  . Cough 02/20/2015  . Wheezing 02/20/2015  . Amblyopia of right eye 03/07/2014  . Nuclear sclerosis of both eyes 03/07/2014  . Hip bursitis 01/25/2014  . Rapid palpitations 07/28/2012  . Alopecia 07/28/2012  . Amblyopia 02/04/2012  . Nuclear cataract 02/04/2012  . Asthma   . Allergic rhinitis   . Hypercholesterolemia   . Back pain   . Colitis   . Hypothyroidism     Current Outpatient Prescriptions on File Prior to Visit  Medication Sig Dispense Refill  . albuterol (PROAIR HFA) 108 (90 Base) MCG/ACT inhaler Inhale 2 puffs into the lungs every 4 (four) hours as needed for wheezing or shortness of breath. 25.5 g 0  . aspirin 81 MG tablet Take 81 mg by mouth as needed (blood thinner).     Marland Kitchen estradiol (ESTRACE) 0.1 MG/GM vaginal cream  Place 1 Applicatorful vaginally once a week.    . fenofibrate 160 MG tablet Take 160 mg by mouth 4 (four) times a week.    . fluticasone (FLONASE) 50 MCG/ACT nasal spray PLACE 2 SPRAYS INTO BOTH NOSTRILS DAILY AS NEEDED FOR ALLERGIES OR RHINITIS (ALLERGIES). 16 g 2  . Fluticasone-Salmeterol (ADVAIR DISKUS) 250-50 MCG/DOSE AEPB Inhale 1 puff into the lungs every 12 (twelve) hours. -- appt needed with PCP for further refills. 180 each 3  . levothyroxine (SYNTHROID, LEVOTHROID) 75 MCG tablet Take 1 tablet (75 mcg total) by mouth daily. --- Office visit needed for further refills 90 tablet 0  . loratadine (CLARITIN) 10 MG tablet Take 10 mg by mouth daily as needed for allergies (allergies).    . metoprolol succinate (TOPROL-XL) 25 MG 24 hr tablet Take 0.5 tablets (12.5 mg total) by mouth daily. 90 tablet 2   No current facility-administered medications on file prior to visit.     Past Medical History:  Diagnosis Date  . Allergic rhinitis, cause unspecified   . Asthma   . Back pain   . Colitis   . Diverticulitis   . Generalized headaches   . GERD (gastroesophageal reflux disease)   . Hypercholesterolemia   . Hypothyroidism     Past Surgical History:  Procedure Laterality Date  . ABDOMINAL HYSTERECTOMY  1983  . APPENDECTOMY  1955  .  BACK SURGERY    . BREAST BIOPSY  1982  . COLONOSCOPY    . EYE SURGERY  11/14/2009, 1990   blocked tear duct repair  . TONSILLECTOMY  1953    Social History   Social History  . Marital status: Married    Spouse name: N/A  . Number of children: 2  . Years of education: 13   Occupational History  . Homemaker    Social History Main Topics  . Smoking status: Never Smoker  . Smokeless tobacco: Never Used     Comment: married, lives with spouse - retired  . Alcohol use No  . Drug use: No  . Sexual activity: Not on file   Other Topics Concern  . Not on file   Social History Narrative   Regular exercise-yes, walk 2-3 times a week   Caffeine  Use-no    Family History  Problem Relation Age of Onset  . Cancer Mother        Kidney and Ovarian Cancer  . Arthritis Mother   . Hyperlipidemia Mother   . Arthritis Father   . Hyperlipidemia Father     Review of Systems  Constitutional: Negative for chills and fever.  Respiratory: Positive for cough (occ with asthma) and wheezing (occ with asthma). Negative for shortness of breath.   Cardiovascular: Negative for chest pain, palpitations and leg swelling.  Gastrointestinal: Negative for abdominal pain.       No gerd  Genitourinary: Positive for decreased urine volume (sometimes - fluctuates). Negative for dysuria (one month ago), frequency and hematuria.  Neurological: Negative for light-headedness and headaches.       Objective:   Vitals:   09/22/16 0943  BP: 124/72  Pulse: 71  Resp: 16  Temp: 98.3 F (36.8 C)   Wt Readings from Last 3 Encounters:  09/22/16 131 lb (59.4 kg)  06/21/16 133 lb (60.3 kg)  05/13/16 132 lb 6.4 oz (60.1 kg)   Body mass index is 23.96 kg/m.   Physical Exam    Constitutional: Appears well-developed and well-nourished. No distress.  HENT:  Head: Normocephalic and atraumatic.  Neck: Neck supple. No tracheal deviation present. No thyromegaly present.  No cervical lymphadenopathy Cardiovascular: Normal rate, regular rhythm and normal heart sounds.   No murmur heard. No carotid bruit .  No edema Pulmonary/Chest: Effort normal and breath sounds normal. No respiratory distress. No has no wheezes. No rales.  Skin: Skin is warm and dry. Not diaphoretic.  Psychiatric: Normal mood and affect. Behavior is normal.      Assessment & Plan:    See Problem List for Assessment and Plan of chronic medical problems.

## 2016-09-20 NOTE — Patient Instructions (Addendum)
  Test(s) ordered today. Your results will be released to MyChart (or called to you) after review, usually within 72hours after test completion. If any changes need to be made, you will be notified at that same time.  All other Health Maintenance issues reviewed.   All recommended immunizations and age-appropriate screenings are up-to-date or discussed.  No immunizations administered today.   Medications reviewed and updated.   No changes recommended at this time.   Please followup in one year, sooner if needed   

## 2016-09-22 ENCOUNTER — Encounter: Payer: Self-pay | Admitting: Internal Medicine

## 2016-09-22 ENCOUNTER — Other Ambulatory Visit (INDEPENDENT_AMBULATORY_CARE_PROVIDER_SITE_OTHER): Payer: Medicare Other

## 2016-09-22 ENCOUNTER — Ambulatory Visit (INDEPENDENT_AMBULATORY_CARE_PROVIDER_SITE_OTHER): Payer: Medicare Other | Admitting: Internal Medicine

## 2016-09-22 VITALS — BP 124/72 | HR 71 | Temp 98.3°F | Resp 16 | Ht 62.0 in | Wt 131.0 lb

## 2016-09-22 DIAGNOSIS — R34 Anuria and oliguria: Secondary | ICD-10-CM

## 2016-09-22 DIAGNOSIS — N200 Calculus of kidney: Secondary | ICD-10-CM | POA: Insufficient documentation

## 2016-09-22 DIAGNOSIS — E02 Subclinical iodine-deficiency hypothyroidism: Secondary | ICD-10-CM

## 2016-09-22 DIAGNOSIS — E7800 Pure hypercholesterolemia, unspecified: Secondary | ICD-10-CM | POA: Diagnosis not present

## 2016-09-22 DIAGNOSIS — J4530 Mild persistent asthma, uncomplicated: Secondary | ICD-10-CM

## 2016-09-22 LAB — COMPREHENSIVE METABOLIC PANEL
ALT: 17 U/L (ref 0–35)
AST: 21 U/L (ref 0–37)
Albumin: 4.1 g/dL (ref 3.5–5.2)
Alkaline Phosphatase: 37 U/L — ABNORMAL LOW (ref 39–117)
BUN: 16 mg/dL (ref 6–23)
CALCIUM: 9.2 mg/dL (ref 8.4–10.5)
CHLORIDE: 104 meq/L (ref 96–112)
CO2: 30 meq/L (ref 19–32)
CREATININE: 0.64 mg/dL (ref 0.40–1.20)
GFR: 97.24 mL/min (ref >60.00)
Glucose, Bld: 100 mg/dL — ABNORMAL HIGH (ref 70–99)
POTASSIUM: 4 meq/L (ref 3.5–5.1)
Sodium: 139 mEq/L (ref 135–145)
Total Bilirubin: 0.4 mg/dL (ref 0.2–1.2)
Total Protein: 7.3 g/dL (ref 6.0–8.3)

## 2016-09-22 LAB — URINALYSIS, ROUTINE W REFLEX MICROSCOPIC
BILIRUBIN URINE: NEGATIVE
HGB URINE DIPSTICK: NEGATIVE
Ketones, ur: NEGATIVE
LEUKOCYTES UA: NEGATIVE
NITRITE: NEGATIVE
Specific Gravity, Urine: 1.025 (ref 1.000–1.030)
TOTAL PROTEIN, URINE-UPE24: NEGATIVE
URINE GLUCOSE: NEGATIVE
UROBILINOGEN UA: 0.2 (ref 0.0–1.0)
pH: 5.5 (ref 5.0–8.0)

## 2016-09-22 LAB — TSH: TSH: 0.4 u[IU]/mL (ref 0.35–4.50)

## 2016-09-22 MED ORDER — SYNTHROID 75 MCG PO TABS: 75.0000 ug | ORAL_TABLET | Freq: Every day | ORAL | 1 refills | Status: DC

## 2016-09-22 NOTE — Assessment & Plan Note (Signed)
Overall controlled Uses albuterol more in allergy season or hot/humid weather Does not want to make changes to medications/inhalers at this time, but we did discuss a couple of other options - increasing advair to higher dose at times of the year or trying a different inhaler

## 2016-09-22 NOTE — Assessment & Plan Note (Signed)
Taking fenofibrate Did not fast so we will hold off on checking lipids

## 2016-09-22 NOTE — Assessment & Plan Note (Signed)
Has intermittent decreased urination  - no dysuria/hematuria Will check UA, UCx and cmp Has seen urology

## 2016-09-22 NOTE — Assessment & Plan Note (Signed)
Check tsh  Titrate med dose if needed Will change to synthroid

## 2016-09-23 ENCOUNTER — Encounter: Payer: Self-pay | Admitting: Internal Medicine

## 2016-09-24 LAB — URINE CULTURE

## 2016-09-26 MED ORDER — SYNTHROID 75 MCG PO TABS: 75.0000 ug | ORAL_TABLET | Freq: Every day | ORAL | 1 refills | Status: DC

## 2016-10-03 NOTE — Telephone Encounter (Signed)
PA for Synthroid was approved.

## 2017-01-15 ENCOUNTER — Ambulatory Visit (INDEPENDENT_AMBULATORY_CARE_PROVIDER_SITE_OTHER): Payer: Medicare Other | Admitting: Cardiology

## 2017-01-15 ENCOUNTER — Encounter: Payer: Self-pay | Admitting: Cardiology

## 2017-01-15 DIAGNOSIS — I739 Peripheral vascular disease, unspecified: Secondary | ICD-10-CM

## 2017-01-15 NOTE — Progress Notes (Signed)
Cardiology Office Note   Date:  01/15/2017   ID:  Phyllis Gingerlizabeth G Presutti, DOB [birth year], MRN [id number]  PCP:  Pincus SanesBurns, Stacy J, MD  Cardiologist: Cassell Clementhomas Brackbill MD --Tobias Alexander>Kayal Mula  Chief complain: Six-month follow-up   History of Present Illness: Elizabeth Mills is a 71 y.o. female who presents for a six-month follow-up , this is a very pleasant lady who has been followed by Dr. Patty SermonsBrackbill for many years for hyper lipidemia hypothyroidism and history of palpitations. She does not have any history of ischemic heart disease and she had normal nuclear stress test in 2007. She had an echocardiogram in December 2008 which showed an ejection fraction of 60-65% with impaired relaxation and normal LV systolic function.  She has intermittent episodic episodes of palpitations that responds to metoprolol, date a cure approximately twice a year. Since the last visit she had only one episode a few days ago to last a few seconds. These happen after she had caffeineted drink. She stays active but recently she has gained some weight as she was not careful about her diet.  05/13/2016 - patient is coming after 6 months, she has been doing well other than stress in her family. She is trying to lose weight but unable to loose. She has been real compliant with healthy diet and her most recent lipids are all excellent. She denies any chest pain or shortness of breath palpitation recently no lower extremity edema orthopnea or proximal nocturnal dyspnea. He complains of feeling tired.  01/15/2017, the patient is coming after 6 months, she has been doing well. She denies any chest pain or shortness of breath. She has noticed pain in both of her feet when walking. They're not hurting at night. She denies any swelling. No orthopnea or proximal nocturnal dyspnea. She has been compliant with her medications and has no side effects. She continues to exercise. She hasn't had any more episodes of longer lasting palpitations  other than skipped beats every now and then. No associated symptoms.  Past Medical History:  Diagnosis Date  . Allergic rhinitis, cause unspecified   . Asthma   . Back pain   . Colitis   . Diverticulitis   . Generalized headaches   . GERD (gastroesophageal reflux disease)   . Hypercholesterolemia   . Hypothyroidism    Past Surgical History:  Procedure Laterality Date  . ABDOMINAL HYSTERECTOMY  1983  . APPENDECTOMY  1955  . BACK SURGERY    . BREAST BIOPSY  1982  . COLONOSCOPY    . EYE SURGERY  11/14/2009, 1990   blocked tear duct repair  . TONSILLECTOMY  1953    Current Outpatient Prescriptions  Medication Sig Dispense Refill  . albuterol (PROAIR HFA) 108 (90 Base) MCG/ACT inhaler Inhale 2 puffs into the lungs every 4 (four) hours as needed for wheezing or shortness of breath. 25.5 g 0  . aspirin 81 MG tablet Take 81 mg by mouth as needed (blood thinner).     Marland Kitchen. estradiol (ESTRACE) 0.1 MG/GM vaginal cream Place 1 Applicatorful vaginally once a week.    . fenofibrate 160 MG tablet Take 160 mg by mouth 4 (four) times a week.    . fluticasone (FLONASE) 50 MCG/ACT nasal spray PLACE 2 SPRAYS INTO BOTH NOSTRILS DAILY AS NEEDED FOR ALLERGIES OR RHINITIS (ALLERGIES). 16 g 2  . Fluticasone-Salmeterol (ADVAIR DISKUS) 250-50 MCG/DOSE AEPB Inhale 1 puff into the lungs every 12 (twelve) hours. -- appt needed with PCP for further refills. 180 each  3  . loratadine (CLARITIN) 10 MG tablet Take 10 mg by mouth daily as needed for allergies (allergies).    . metoprolol succinate (TOPROL-XL) 25 MG 24 hr tablet Take 0.5 tablets (12.5 mg total) by mouth daily. 90 tablet 2  . SYNTHROID 75 MCG tablet Take 1 tablet (75 mcg total) by mouth daily before breakfast. 90 tablet 1   No current facility-administered medications for this visit.     Allergies:   Crestor [rosuvastatin calcium]; Lipitor [atorvastatin calcium]; Phenergan [promethazine hcl]; Pravachol; Promethazine; Promethazine hcl; Sulfa  antibiotics; Sulfamethoxazole; Zetia [ezetimibe]; and Zocor [simvastatin - high dose]    Social History:  The patient  reports that she has never smoked. She has never used smokeless tobacco. She reports that she does not drink alcohol or use drugs.   Family History:  The patient's family history includes Arthritis in her father and mother; Cancer in her mother; Hyperlipidemia in her father and mother.    ROS:  Please see the history of present illness.   Otherwise, review of systems are positive for none.   All other systems are reviewed and negative.    PHYSICAL EXAM: VS:  BP (!) 120/58   Pulse 80   Ht 5\' 2"  (1.575 m)   Wt 131 lb (59.4 kg)   SpO2 99%   BMI 23.96 kg/m  , BMI Body mass index is 23.96 kg/m. GEN: Well nourished, well developed, in no acute distress  HEENT: normal  Neck: no JVD, carotid bruits, or masses Cardiac: RRR; no murmurs, rubs, or gallops,no edema  Respiratory:  clear to auscultation bilaterally, normal work of breathing GI: soft, nontender, nondistended, + BS MS: no deformity or atrophy  Skin: warm and dry, no rash Neuro:  Strength and sensation are intact Psych: euthymic mood, full affect  EKG:  EKG is not ordered today.  Recent Labs: 04/25/2016: Hemoglobin 13.3; Platelets 299 09/22/2016: ALT 17; BUN 16; Creatinine, Ser 0.64; Potassium 4.0; Sodium 139; TSH 0.40   Lipid Panel    Component Value Date/Time   CHOL 191 04/25/2016 0813   TRIG 61 04/25/2016 0813   HDL 94 04/25/2016 0813   CHOLHDL 2.0 04/25/2016 0813   CHOLHDL 2.5 04/26/2015 0939   VLDL 12 04/26/2015 0939   LDLCALC 85 04/25/2016 0813   LDLDIRECT 94.2 07/28/2012 1010   Wt Readings from Last 3 Encounters:  01/15/17 131 lb (59.4 kg)  09/22/16 131 lb (59.4 kg)  06/21/16 133 lb (60.3 kg)    EKG - reviewed personally shows normal sinus rate and normal EKG and is unchanged from prior.   ASSESSMENT AND PLAN: 1. Hypercholesterolemia - all lipids excellent with HDL 94, LDL 84, and  triglycerides 61 she is congratulated on excellent effort And encouraged to continue exercise. 2. Palpitations - no recent palpitations other than PVCs, continue current regimen. 3.Claudications - she has decent pulses however we would check for peripheral arterial duplex bilaterally..   No orders of the defined types were placed in this encounter.  Follow-up in 6 months.  Signed, Tobias Alexander 01/15/2017 1:24 PM    [facility] [address] [place], [place], [place]  [address] Phone: [phone]; Fax: [phone]  01/15/2017 1:24 PM    [facility] [address] [place], [place], [place]  [address] Phone: [phone]; Fax: [phone]

## 2017-01-15 NOTE — Patient Instructions (Signed)
Medication Instructions:   Your physician recommends that you continue on your current medications as directed. Please refer to the Current Medication list given to you today.     Testing/Procedures:  Your physician has requested that you have a lower extremity arterial duplex. This test is an ultrasound of the arteries in the legs. It looks at arterial blood flow in the legs. Allow one hour for Lower scans. There are no restrictions or special instructions      Follow-Up:  Your physician wants you to follow-up in: 6 MONTHS WITH DR Johnell ComingsNELSON You will receive a reminder letter in the mail two months in advance. If you don't receive a letter, please call our office to schedule the follow-up appointment.         If you need a refill on your cardiac medications before your next appointment, please call your pharmacy.

## 2017-01-21 ENCOUNTER — Other Ambulatory Visit: Payer: Self-pay | Admitting: Internal Medicine

## 2017-01-21 ENCOUNTER — Other Ambulatory Visit: Payer: Self-pay | Admitting: Cardiology

## 2017-01-21 DIAGNOSIS — I739 Peripheral vascular disease, unspecified: Secondary | ICD-10-CM

## 2017-02-03 ENCOUNTER — Ambulatory Visit (HOSPITAL_COMMUNITY)
Admission: RE | Admit: 2017-02-03 | Discharge: 2017-02-03 | Disposition: A | Discharge status: Home / Self Care | Payer: Medicare Other | Source: Ambulatory Visit | Attending: Cardiology | Admitting: Cardiology

## 2017-02-03 DIAGNOSIS — I739 Peripheral vascular disease, unspecified: Secondary | ICD-10-CM

## 2017-02-23 LAB — HM COLONOSCOPY

## 2017-03-12 ENCOUNTER — Encounter: Payer: Self-pay | Admitting: Internal Medicine

## 2017-03-17 ENCOUNTER — Other Ambulatory Visit: Payer: Self-pay | Admitting: Internal Medicine

## 2017-05-05 ENCOUNTER — Other Ambulatory Visit: Payer: Self-pay | Admitting: Internal Medicine

## 2017-05-05 DIAGNOSIS — Z139 Encounter for screening, unspecified: Secondary | ICD-10-CM

## 2017-05-06 ENCOUNTER — Ambulatory Visit
Admission: RE | Admit: 2017-05-06 | Discharge: 2017-05-06 | Disposition: A | Discharge status: Home / Self Care | Payer: Medicare Other | Source: Ambulatory Visit | Attending: Internal Medicine | Admitting: Internal Medicine

## 2017-05-06 ENCOUNTER — Other Ambulatory Visit: Payer: Self-pay | Admitting: Internal Medicine

## 2017-05-06 DIAGNOSIS — N6310 Unspecified lump in the right breast, unspecified quadrant: Secondary | ICD-10-CM

## 2017-05-06 DIAGNOSIS — Z139 Encounter for screening, unspecified: Secondary | ICD-10-CM

## 2017-05-11 ENCOUNTER — Other Ambulatory Visit: Payer: Self-pay | Admitting: Internal Medicine

## 2017-05-14 ENCOUNTER — Ambulatory Visit
Admission: RE | Admit: 2017-05-14 | Discharge: 2017-05-14 | Disposition: A | Discharge status: Home / Self Care | Payer: Medicare Other | Source: Ambulatory Visit | Attending: Internal Medicine | Admitting: Internal Medicine

## 2017-05-14 DIAGNOSIS — N6310 Unspecified lump in the right breast, unspecified quadrant: Secondary | ICD-10-CM

## 2017-05-19 ENCOUNTER — Other Ambulatory Visit: Payer: Self-pay | Admitting: Cardiology

## 2017-07-01 ENCOUNTER — Encounter: Payer: Self-pay | Admitting: Physician Assistant

## 2017-07-01 NOTE — Progress Notes (Signed)
Cardiology Office Note    Date:  07/07/2017   ID:  Elizabeth Mills, DOB 1946-01-20, MRN [id number]  PCP:  Pincus Sanes, MD  Cardiologist: Tobias Alexander, MD  No chief complaint on file.   History of Present Illness:  Elizabeth Mills is a 72 y.o. female with history of palpitations, hyperlipidemia and hypothyroidism.  Last saw Dr. Delton See 12/2016 complaining of claudication symptoms and lower extremity Dopplers were normal. 2D echo 2008 normal LVEF 60-65% mild to moderate LVH and mild aortic valve sclerosis without stenosis.  Comes in today for a regular check up. Walks 1 mile several days a week. 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  Denies any chest pain, palpitation, dyspnea, dyspnea on exertion, dizziness or presyncope.  Has had a cough for about 3 weeks with all her allergies and asthma going on.  Ran out of her fenofibrate several months ago and is taking cholestyramine and read that that was good for cholesterol so did not call to refill it.   Past Medical History:  Diagnosis Date  . Allergic rhinitis, cause unspecified   . Asthma   . Back pain   . Colitis   . Diverticulitis   . Generalized headaches   . GERD (gastroesophageal reflux disease)   . Hypercholesterolemia   . Hypothyroidism     Past Surgical History:  Procedure Laterality Date  . ABDOMINAL HYSTERECTOMY  1983  . APPENDECTOMY  1955  . BACK SURGERY    . BREAST BIOPSY  1982  . BREAST EXCISIONAL BIOPSY Bilateral   . COLONOSCOPY    . EYE SURGERY  11/14/2009, 1990   blocked tear duct repair  . TONSILLECTOMY  1953    Current Medications: Current Meds  Medication Sig  . albuterol (PROAIR HFA) 108 (90 Base) MCG/ACT inhaler Inhale 2 puffs into the lungs every 4 (four) hours as needed for wheezing or shortness of breath.  Marland Kitchen aspirin 81 MG tablet Take 81 mg by mouth as needed (blood thinner).   . cholestyramine (QUESTRAN) 4 GM/DOSE powder Take 1 packet by mouth daily.  Marland Kitchen estradiol (ESTRACE) 0.1  MG/GM vaginal cream Place 1 Applicatorful vaginally once a week.  . fenofibrate 160 MG tablet Take 160 mg by mouth 4 (four) times a week.  . fluticasone (FLONASE) 50 MCG/ACT nasal spray PLACE 2 SPRAYS INTO BOTH NOSTRILS DAILY AS NEEDED FOR ALLERGIES OR RHINITIS (ALLERGIES).  . Fluticasone-Salmeterol (ADVAIR DISKUS) 250-50 MCG/DOSE AEPB Inhale 1 puff into the lungs every 12 (twelve) hours. -- appt needed with PCP for further refills.  Marland Kitchen loratadine (CLARITIN) 10 MG tablet Take 10 mg by mouth daily as needed for allergies (allergies).  . metoprolol succinate (TOPROL-XL) 25 MG 24 hr tablet TAKE ONE-HALF (1/2) TABLET DAILY  . SYNTHROID 75 MCG tablet TAKE 1 TABLET DAILY BEFORE BREAKFAST     Allergies:   Crestor [rosuvastatin calcium]; Lipitor [atorvastatin calcium]; Phenergan [promethazine hcl]; Pravachol; Promethazine; Promethazine hcl; Sulfa antibiotics; Sulfamethoxazole; Zetia [ezetimibe]; and Zocor [simvastatin - high dose]   Social History   Socioeconomic History  . Marital status: Married    Spouse name: Not on file  . Number of children: 2  . Years of education: 18  . Highest education level: Not on file  Occupational History  . Occupation: Futures trader  Social Needs  . Financial resource strain: Not on file  . Food insecurity:    Worry: Not on file    Inability: Not on file  . Transportation needs:    Medical: Not on file  Non-medical: Not on file  Tobacco Use  . Smoking status: Never Smoker  . Smokeless tobacco: Never Used  . Tobacco comment: married, lives with spouse - retired  Substance and Sexual Activity  . Alcohol use: No  . Drug use: No  . Sexual activity: Not on file  Lifestyle  . Physical activity:    Days per week: Not on file    Minutes per session: Not on file  . Stress: Not on file  Relationships  . Social connections:    Talks on phone: Not on file    Gets together: Not on file    Attends religious service: Not on file    Active member of club or  organization: Not on file    Attends meetings of clubs or organizations: Not on file    Relationship status: Not on file  Other Topics Concern  . Not on file  Social History Narrative   Regular exercise-yes, walk 2-3 times a week   Caffeine Use-no     Family History:  The patient's family history includes Arthritis in her father and mother; Cancer in her mother; Hyperlipidemia in her father and mother.   ROS:   Please see the history of present illness.    Review of Systems  Constitution: Negative.  HENT: Negative.   Eyes: Negative.   Cardiovascular: Negative.   Respiratory: Positive for cough and wheezing.   Hematologic/Lymphatic: Negative.   Musculoskeletal: Negative.  Negative for joint pain.  Gastrointestinal: Negative.   Genitourinary: Negative.   Neurological: Negative.    All other systems reviewed and are negative.   PHYSICAL EXAM:   VS:  BP 116/72   Pulse 89   Ht 5' 2.5" (1.588 m)   Wt 130 lb (59 kg)   SpO2 95%   BMI 23.40 kg/m   Physical Exam  GEN: Well nourished, well developed, in no acute distress  Neck: no JVD, carotid bruits, or masses Cardiac:RRR; no murmurs, rubs, or gallops  Respiratory:  clear to auscultation bilaterally, normal work of breathing GI: soft, nontender, nondistended, + BS Ext: without cyanosis, clubbing, or edema, Good distal pulses bilaterally Neuro:  Alert and Oriented x 3,  Psych: euthymic mood, full affect  Wt Readings from Last 3 Encounters:  07/07/17 130 lb (59 kg)  01/15/17 131 lb (59.4 kg)  09/22/16 131 lb (59.4 kg)      Studies/Labs Reviewed:   EKG:  EKG is  ordered today.  The ekg ordered today demonstrates normal sinus rhythm, normal EKG  Recent Labs: 09/22/2016: ALT 17; BUN 16; Creatinine, Ser 0.64; Potassium 4.0; Sodium 139; TSH 0.40   Lipid Panel    Component Value Date/Time   CHOL 191 04/25/2016 0813   TRIG 61 04/25/2016 0813   HDL 94 04/25/2016 0813   CHOLHDL 2.0 04/25/2016 0813   CHOLHDL 2.5 04/26/2015  0939   VLDL 12 04/26/2015 0939   LDLCALC 85 04/25/2016 0813   LDLDIRECT 94.2 07/28/2012 1010    Additional studies/ records that were reviewed today include:  Lower extremity ABIs 01/2017 Final Interpretation: Right: Resting right ankle-brachial index is within normal range. No evidence of significant right lower extremity arterial disease. The right toe-brachial index is normal. Left: Resting left ankle-brachial index is within normal range. No evidence of significant left lower extremity arterial disease. The left toe-brachial index is normal. PPG tracings appear dampened.   See table(s) above for measurements and observations.       ASSESSMENT:    1. Rapid palpitations  2. Hypercholesterolemia   3. Mild intermittent asthma without complication      PLAN:  In order of problems listed above:  Palpitations controlled with metoprolol.  Follow-up with Dr. Delton See in 1 year.  Hypercholesterolemia stopped fenofibrate several months ago and is only on cholestyramine which she thinks will help with her cholesterol.  Will have a fasting lipid panel done and resume fenofibrate if indicated.  Asthma which seems to have worsened with seasonal allergies.  She will see Dr. Lawerance Bach for further treatment.    Medication Adjustments/Labs and Tests Ordered: Current medicines are reviewed at length with the patient today.  Concerns regarding medicines are outlined above.  Medication changes, Labs and Tests ordered today are listed in the Patient Instructions below. Patient Instructions  Medication Instructions:  Your provider recommends that you continue on your current medications as directed. Please refer to the Current Medication list given to you today.    Labwork: Your provider recommends that you return for FASTING lab work.  Testing/Procedures: None  Follow-Up: Your provider wants you to follow-up in: 1 year with Dr. Delton See. You will receive a reminder letter in the mail two  months in advance. If you don't receive a letter, please call our office to schedule the follow-up appointment.    Any Other Special Instructions Will Be Listed Below (If Applicable).     If you need a refill on your cardiac medications before your next appointment, please call your pharmacy.      Elson Clan, PA-C  07/07/2017 9:38 AM    [facility] [address] [place], [place], [place]  [address] Phone: [phone]; Fax: [phone]

## 2017-07-07 ENCOUNTER — Encounter: Payer: Self-pay | Admitting: Physician Assistant

## 2017-07-07 ENCOUNTER — Ambulatory Visit (INDEPENDENT_AMBULATORY_CARE_PROVIDER_SITE_OTHER): Payer: Medicare Other | Admitting: Physician Assistant

## 2017-07-07 VITALS — BP 116/72 | HR 89 | Ht 62.5 in | Wt 130.0 lb

## 2017-07-07 DIAGNOSIS — E7800 Pure hypercholesterolemia, unspecified: Secondary | ICD-10-CM | POA: Diagnosis not present

## 2017-07-07 DIAGNOSIS — J4520 Mild intermittent asthma, uncomplicated: Secondary | ICD-10-CM

## 2017-07-07 DIAGNOSIS — R002 Palpitations: Secondary | ICD-10-CM | POA: Diagnosis not present

## 2017-07-07 NOTE — Patient Instructions (Signed)
Medication Instructions:  Your provider recommends that you continue on your current medications as directed. Please refer to the Current Medication list given to you today.    Labwork: Your provider recommends that you return for FASTING lab work.  Testing/Procedures: None  Follow-Up: Your provider wants you to follow-up in: 1 year with Dr. Nelson. You will receive a reminder letter in the mail two months in advance. If you don't receive a letter, please call our office to schedule the follow-up appointment.    Any Other Special Instructions Will Be Listed Below (If Applicable).     If you need a refill on your cardiac medications before your next appointment, please call your pharmacy.   

## 2017-07-09 ENCOUNTER — Other Ambulatory Visit: Payer: Medicare Other

## 2017-07-09 DIAGNOSIS — E7800 Pure hypercholesterolemia, unspecified: Secondary | ICD-10-CM

## 2017-07-09 LAB — LIPID PANEL
Chol/HDL Ratio: 2.8 ratio (ref 0.0–4.4)
Cholesterol, Total: 223 mg/dL — ABNORMAL HIGH (ref 100–199)
HDL: 79 mg/dL
LDL Calculated: 119 mg/dL — ABNORMAL HIGH (ref 0–99)
Triglycerides: 127 mg/dL (ref 0–149)
VLDL Cholesterol Cal: 25 mg/dL (ref 5–40)

## 2017-07-11 ENCOUNTER — Other Ambulatory Visit: Payer: Self-pay | Admitting: Internal Medicine

## 2017-07-13 ENCOUNTER — Other Ambulatory Visit: Payer: Self-pay

## 2017-07-13 ENCOUNTER — Telehealth: Payer: Self-pay | Admitting: *Deleted

## 2017-07-13 MED ORDER — FENOFIBRATE 160 MG PO TABS: 160.0000 mg | ORAL_TABLET | ORAL | 3 refills | Status: DC

## 2017-07-13 NOTE — Telephone Encounter (Signed)
Patient called and stated that she just spoke with you in regards to re-starting the fenofibrate. [facility] however she reports that she only takes 3-4 tablets a week but her rx is usually written for daily. Please advise. Thanks, MI

## 2017-07-13 NOTE — Telephone Encounter (Signed)
It was recommended that patient restarts her Fenofibrate. She verbalized understanding and I re-ordered.

## 2017-08-27 ENCOUNTER — Ambulatory Visit (INDEPENDENT_AMBULATORY_CARE_PROVIDER_SITE_OTHER): Payer: Medicare Other | Admitting: *Deleted

## 2017-08-27 VITALS — BP 116/62 | HR 76 | Resp 17 | Ht 63.0 in | Wt 131.0 lb

## 2017-08-27 DIAGNOSIS — Z0000 Encounter for general adult medical examination without abnormal findings: Secondary | ICD-10-CM

## 2017-08-27 NOTE — Progress Notes (Addendum)
Subjective:   Elizabeth Mills is a 72 y.o. female who presents for Medicare Annual (Subsequent) preventive examination.  Review of Systems:  No ROS.  Medicare Wellness Visit. Additional risk factors are reflected in the social history.  Cardiac Risk Factors include: advanced age (>66men, >51 women);dyslipidemia Sleep patterns: gets up 1 times nightly to void and sleeps 6-7 hours nightly.    Home Safety/Smoke Alarms: Feels safe in home. Smoke alarms in place.  Living environment; residence and Firearm Safety: 1-story house/ trailer, no firearms Lives with husband no needs for DME, good support system. Seat Belt Safety/Bike Helmet: Wears seat belt.     Objective:     Vitals: BP 116/62   Pulse 76   Resp 17   Ht 5\' 3"  (1.6 m)   Wt 131 lb (59.4 kg)   SpO2 99%   BMI 23.21 kg/m   Body mass index is 23.21 kg/m.  Advanced Directives 08/27/2017 08/28/2015  Does Patient Have a Medical Advance Directive? Yes Yes  Type of Estate agent of [place];Living will -  Copy of Healthcare Power of Attorney in Chart? No - copy requested -    Tobacco Social History   Tobacco Use  Smoking Status Never Smoker  Smokeless Tobacco Never Used  Tobacco Comment   married, lives with spouse - retired     Counseling given: Not Answered Comment: married, lives with spouse - retired  Past Medical History:  Diagnosis Date  . Allergic rhinitis, cause unspecified   . Asthma   . Back pain   . Colitis   . Diverticulitis   . Generalized headaches   . GERD (gastroesophageal reflux disease)   . Hypercholesterolemia   . Hypothyroidism    Past Surgical History:  Procedure Laterality Date  . ABDOMINAL HYSTERECTOMY  1983  . APPENDECTOMY  1955  . BACK SURGERY    . BREAST BIOPSY  1982  . BREAST EXCISIONAL BIOPSY Bilateral   . COLONOSCOPY    . EYE SURGERY  11/14/2009, 1990   blocked tear duct repair  . TONSILLECTOMY  1953   Family History  Problem Relation Age of Onset  .  Cancer Mother        Kidney and Ovarian Cancer  . Arthritis Mother   . Hyperlipidemia Mother   . Arthritis Father   . Hyperlipidemia Father   . Breast cancer Neg Hx    Social History   Socioeconomic History  . Marital status: Married    Spouse name: Not on file  . Number of children: 2  . Years of education: 50  . Highest education level: Not on file  Occupational History  . Occupation: Futures trader  Social Needs  . Financial resource strain: Not hard at all  . Food insecurity:    Worry: Never true    Inability: Never true  . Transportation needs:    Medical: No    Non-medical: No  Tobacco Use  . Smoking status: Never Smoker  . Smokeless tobacco: Never Used  . Tobacco comment: married, lives with spouse - retired  Substance and Sexual Activity  . Alcohol use: No  . Drug use: No  . Sexual activity: Not Currently  Lifestyle  . Physical activity:    Days per week: 4 days    Minutes per session: 60 min  . Stress: To some extent  Relationships  . Social connections:    Talks on phone: More than three times a week    Gets together: More than three  times a week    Attends religious service: 1 to 4 times per year    Active member of club or organization: Yes    Attends meetings of clubs or organizations: 1 to 4 times per year    Relationship status: Married  Other Topics Concern  . Not on file  Social History Narrative   Regular exercise-yes, walk 2-3 times a week   Caffeine Use-no    Outpatient Encounter Medications as of 08/27/2017  Medication Sig  . ADVAIR DISKUS 250-50 MCG/DOSE AEPB USE 1 INHALATION EVERY 12 HOURS (NEED APPOINTMENT WITH PRIMARY CARE PHYSICIAN FOR FURTHER REFILLS)  . albuterol (PROAIR HFA) 108 (90 Base) MCG/ACT inhaler Inhale 2 puffs into the lungs every 4 (four) hours as needed for wheezing or shortness of breath.  Marland Kitchen. aspirin 81 MG tablet Take 81 mg by mouth as needed (blood thinner).   . Cholecalciferol (VITAMIN D PO) Take 1 tablet by mouth daily.    Marland Kitchen. estradiol (ESTRACE) 0.1 MG/GM vaginal cream Place 1 Applicatorful vaginally once a week.  . fenofibrate 160 MG tablet Take 1 tablet (160 mg total) by mouth 4 (four) times a week.  . fluticasone (FLONASE) 50 MCG/ACT nasal spray PLACE 2 SPRAYS INTO BOTH NOSTRILS DAILY AS NEEDED FOR ALLERGIES OR RHINITIS (ALLERGIES).  Marland Kitchen. loratadine (CLARITIN) 10 MG tablet Take 10 mg by mouth daily as needed for allergies (allergies).  . metoprolol succinate (TOPROL-XL) 25 MG 24 hr tablet TAKE ONE-HALF (1/2) TABLET DAILY  . SYNTHROID 75 MCG tablet TAKE 1 TABLET DAILY BEFORE BREAKFAST  . [DISCONTINUED] cholestyramine (QUESTRAN) 4 GM/DOSE powder Take 1 packet by mouth daily.   No facility-administered encounter medications on file as of 08/27/2017.     Activities of Daily Living In your present state of health, do you have any difficulty performing the following activities: 08/27/2017  Hearing? N  Vision? N  Difficulty concentrating or making decisions? N  Walking or climbing stairs? N  Dressing or bathing? N  Doing errands, shopping? N  Preparing Food and eating ? N  Using the Toilet? N  In the past six months, have you accidently leaked urine? N  Do you have problems with loss of bowel control? N  Managing your Medications? N  Managing your Finances? N  Housekeeping or managing your Housekeeping? N  Some recent data might be hidden    Patient Care Team: Pincus SanesBurns, Stacy J, MD as PCP - General (Internal Medicine) Lars MassonNelson, Katarina H, MD as PCP - Cardiology (Cardiology) Cassell ClementBrackbill, Thomas, MD (Cardiology) Gretta CoolLomax, Charles W, MD (Inactive) (Obstetrics and Gynecology) Bernette RedbirdBuccini, Robert, MD (Gastroenterology)    Assessment:   This is a routine wellness examination for [name]. Physical assessment deferred to PCP.   Exercise Activities and Dietary recommendations Current Exercise Habits: Home exercise routine, Type of exercise: walking, Time (Minutes): 40, Frequency (Times/Week): 3, Weekly Exercise  (Minutes/Week): 120, Intensity: Mild, Exercise limited by: None identified  Diet (meal preparation, eat out, water intake, caffeinated beverages, dairy products, fruits and vegetables): in general, a "healthy" diet  , well balanced   Reviewed heart healthy diet, Encouraged patient to increase daily water and fluid intake.     Goals    . Patient Stated     I want take a day off during the week and do just what I want to do. Enjoy life and family.       Fall Risk Fall Risk  08/27/2017 09/22/2016 08/28/2015 03/28/2014 03/10/2013  Falls in the past year? No No No No No  Depression Screen PHQ 2/9 Scores 08/27/2017 09/22/2016 09/22/2016 08/28/2015  PHQ - 2 Score 1 0 0 0  PHQ- 9 Score - 0 - -     Cognitive Function       Ad8 score reviewed for issues:  Issues making decisions: no  Less interest in hobbies / activities: no  Repeats questions, stories (family complaining): no  Trouble using ordinary gadgets (microwave, computer, phone):no  Forgets the month or year: no  Mismanaging finances: no  Remembering appts: no  Daily problems with thinking and/or memory: no Ad8 score is= 0  Immunization History  Administered Date(s) Administered  . Influenza Split 12/23/2011  . Influenza,inj,Quad PF,6+ Mos 03/10/2013, 12/30/2013, 03/03/2016  . Pneumococcal Conjugate-13 01/25/2014  . Pneumococcal Polysaccharide-23 03/22/2015  . Pneumococcal-Unspecified 03/25/2007  . Td 03/24/2002  . Tdap 12/30/2013   Screening Tests Health Maintenance  Topic Date Due  . INFLUENZA VACCINE  10/22/2017  . MAMMOGRAM  05/15/2019  . TETANUS/TDAP  12/31/2023  . COLONOSCOPY  02/24/2027  . DEXA SCAN  Completed  . Hepatitis C Screening  Completed  . PNA vac Low Risk Adult  Completed      Plan:    Continue doing brain stimulating activities (puzzles, reading, adult coloring books, staying active) to keep memory sharp.   Continue to eat heart healthy diet (full of fruits, vegetables, whole grains, lean  protein, water--limit salt, fat, and sugar intake) and increase physical activity as tolerated.  I have personally reviewed and noted the following in the patient's chart:   . Medical and social history . Use of alcohol, tobacco or illicit drugs  . Current medications and supplements . Functional ability and status . Nutritional status . Physical activity . Advanced directives . List of other physicians . Vitals . Screenings to include cognitive, depression, and falls . Referrals and appointments  In addition, I have reviewed and discussed with patient certain preventive protocols, quality metrics, and best practice recommendations. A written personalized care plan for preventive services as well as general preventive health recommendations were provided to patient.     Wanda Plump, RN  08/27/2017  Medical screening examination/treatment/procedure(s) were performed by non-physician practitioner and as supervising provider I was immediately available for consultation/collaboration.  I agree with above. Olive Bass, FNP

## 2017-08-27 NOTE — Patient Instructions (Addendum)
[email or web address] or down load app on smart phone  Aunt Berenice Primas website lists multiple social resources for individuals such as: food, health, money, house hold goods, transit, medical supplies, job training and legal services.  Continue doing brain stimulating activities (puzzles, reading, adult coloring books, staying active) to keep memory sharp.   Continue to eat heart healthy diet (full of fruits, vegetables, whole grains, lean protein, water--limit salt, fat, and sugar intake) and increase physical activity as tolerated.   [name] , Thank you for taking time to come for your Medicare Wellness Visit. I appreciate your ongoing commitment to your health goals. Please review the following plan we discussed and let me know if I can assist you in the future.   These are the goals we discussed: Goals    . Patient Stated     I want take a day off during the week and do just what I want to do.Enjoy life and family.       This is a list of the screening recommended for you and due dates:  Health Maintenance  Topic Date Due  . Flu Shot  10/22/2017  . Mammogram  05/15/2019  . Tetanus Vaccine  12/31/2023  . Colon Cancer Screening  02/24/2027  . DEXA scan (bone density measurement)  Completed  .  Hepatitis C: One time screening is recommended by Center for Disease Control  (CDC) for  adults born from [birth year] through 1965.   Completed  . Pneumonia vaccines  Completed   Health Maintenance, Female Adopting a healthy lifestyle and getting preventive care can go a long way to promote health and wellness. Talk with your health care provider about what schedule of regular examinations is right for you. This is a good chance for you to check in with your provider about disease prevention and staying healthy. In between checkups, there are plenty of things you can do on your own. Experts have done a lot of research about which lifestyle changes and preventive measures are most likely to keep you  healthy. Ask your health care provider for more information. Weight and diet Eat a healthy diet  Be sure to include plenty of vegetables, fruits, low-fat dairy products, and lean protein.  Do not eat a lot of foods high in solid fats, added sugars, or salt.  Get regular exercise. This is one of the most important things you can do for your health. ? Most adults should exercise for at least 150 minutes each week. The exercise should increase your heart rate and make you sweat (moderate-intensity exercise). ? Most adults should also do strengthening exercises at least twice a week. This is in addition to the moderate-intensity exercise.  Maintain a healthy weight  Body mass index (BMI) is a measurement that can be used to identify possible weight problems. It estimates body fat based on height and weight. Your health care provider can help determine your BMI and help you achieve or maintain a healthy weight.  For females 32 years of age and older: ? A BMI below 18.5 is considered underweight. ? A BMI of 18.5 to 24.9 is normal. ? A BMI of 25 to 29.9 is considered overweight. ? A BMI of 30 and above is considered obese.  Watch levels of cholesterol and blood lipids  You should start having your blood tested for lipids and cholesterol at 72 years of age, then have this test every 5 years.  You may need to have your cholesterol levels checked more often if: ?  Your lipid or cholesterol levels are high. ? You are older than 72 years of age. ? You are at high risk for heart disease.  Cancer screening Lung Cancer  Lung cancer screening is recommended for adults 1-56 years old who are at high risk for lung cancer because of a history of smoking.  A yearly low-dose CT scan of the lungs is recommended for people who: ? Currently smoke. ? Have quit within the past 15 years. ? Have at least a 30-pack-year history of smoking. A pack year is smoking an average of one pack of cigarettes a day  for 1 year.  Yearly screening should continue until it has been 15 years since you quit.  Yearly screening should stop if you develop a health problem that would prevent you from having lung cancer treatment.  Breast Cancer  Practice breast self-awareness. This means understanding how your breasts normally appear and feel.  It also means doing regular breast self-exams. Let your health care provider know about any changes, no matter how small.  If you are in your 20s or 30s, you should have a clinical breast exam (CBE) by a health care provider every 1-3 years as part of a regular health exam.  If you are 47 or older, have a CBE every year. Also consider having a breast X-ray (mammogram) every year.  If you have a family history of breast cancer, talk to your health care provider about genetic screening.  If you are at high risk for breast cancer, talk to your health care provider about having an MRI and a mammogram every year.  Breast cancer gene (BRCA) assessment is recommended for women who have family members with BRCA-related cancers. BRCA-related cancers include: ? Breast. ? Ovarian. ? Tubal. ? Peritoneal cancers.  Results of the assessment will determine the need for genetic counseling and BRCA1 and BRCA2 testing.  Cervical Cancer Your health care provider may recommend that you be screened regularly for cancer of the pelvic organs (ovaries, uterus, and vagina). This screening involves a pelvic examination, including checking for microscopic changes to the surface of your cervix (Pap test). You may be encouraged to have this screening done every 3 years, beginning at age 30.  For women ages 15-65, health care providers may recommend pelvic exams and Pap testing every 3 years, or they may recommend the Pap and pelvic exam, combined with testing for human papilloma virus (HPV), every 5 years. Some types of HPV increase your risk of cervical cancer. Testing for HPV may also be done  on women of any age with unclear Pap test results.  Other health care providers may not recommend any screening for nonpregnant women who are considered low risk for pelvic cancer and who do not have symptoms. Ask your health care provider if a screening pelvic exam is right for you.  If you have had past treatment for cervical cancer or a condition that could lead to cancer, you need Pap tests and screening for cancer for at least 20 years after your treatment. If Pap tests have been discontinued, your risk factors (such as having a new sexual partner) need to be reassessed to determine if screening should resume. Some women have medical problems that increase the chance of getting cervical cancer. In these cases, your health care provider may recommend more frequent screening and Pap tests.  Colorectal Cancer  This type of cancer can be detected and often prevented.  Routine colorectal cancer screening usually begins at 72 years  of age and continues through 72 years of age.  Your health care provider may recommend screening at an earlier age if you have risk factors for colon cancer.  Your health care provider may also recommend using home test kits to check for hidden blood in the stool.  A small camera at the end of a tube can be used to examine your colon directly (sigmoidoscopy or colonoscopy). This is done to check for the earliest forms of colorectal cancer.  Routine screening usually begins at age 56.  Direct examination of the colon should be repeated every 5-10 years through 72 years of age. However, you may need to be screened more often if early forms of precancerous polyps or small growths are found.  Skin Cancer  Check your skin from head to toe regularly.  Tell your health care provider about any new moles or changes in moles, especially if there is a change in a mole's shape or color.  Also tell your health care provider if you have a mole that is larger than the size of  a pencil eraser.  Always use sunscreen. Apply sunscreen liberally and repeatedly throughout the day.  Protect yourself by wearing long sleeves, pants, a wide-brimmed hat, and sunglasses whenever you are outside.  Heart disease, diabetes, and high blood pressure  High blood pressure causes heart disease and increases the risk of stroke. High blood pressure is more likely to develop in: ? People who have blood pressure in the high end of the normal range (130-139/85-89 mm Hg). ? People who are overweight or obese. ? People who are African American.  If you are 49-76 years of age, have your blood pressure checked every 3-5 years. If you are 17 years of age or older, have your blood pressure checked every year. You should have your blood pressure measured twice-once when you are at a hospital or clinic, and once when you are not at a hospital or clinic. Record the average of the two measurements. To check your blood pressure when you are not at a hospital or clinic, you can use: ? An automated blood pressure machine at a pharmacy. ? A home blood pressure monitor.  If you are between 47 years and 55 years old, ask your health care provider if you should take aspirin to prevent strokes.  Have regular diabetes screenings. This involves taking a blood sample to check your fasting blood sugar level. ? If you are at a normal weight and have a low risk for diabetes, have this test once every three years after 72 years of age. ? If you are overweight and have a high risk for diabetes, consider being tested at a younger age or more often. Preventing infection Hepatitis B  If you have a higher risk for hepatitis B, you should be screened for this virus. You are considered at high risk for hepatitis B if: ? You were born in a country where hepatitis B is common. Ask your health care provider which countries are considered high risk. ? Your parents were born in a high-risk country, and you have not been  immunized against hepatitis B (hepatitis B vaccine). ? You have HIV or AIDS. ? You use needles to inject street drugs. ? You live with someone who has hepatitis B. ? You have had sex with someone who has hepatitis B. ? You get hemodialysis treatment. ? You take certain medicines for conditions, including cancer, organ transplantation, and autoimmune conditions.  Hepatitis C  Blood  testing is recommended for: ? Everyone born from [birth year] through 1965. ? Anyone with known risk factors for hepatitis C.  Sexually transmitted infections (STIs)  You should be screened for sexually transmitted infections (STIs) including gonorrhea and chlamydia if: ? You are sexually active and are younger than 72 years of age. ? You are older than 72 years of age and your health care provider tells you that you are at risk for this type of infection. ? Your sexual activity has changed since you were last screened and you are at an increased risk for chlamydia or gonorrhea. Ask your health care provider if you are at risk.  If you do not have HIV, but are at risk, it may be recommended that you take a prescription medicine daily to prevent HIV infection. This is called pre-exposure prophylaxis (PrEP). You are considered at risk if: ? You are sexually active and do not regularly use condoms or know the HIV status of your partner(s). ? You take drugs by injection. ? You are sexually active with a partner who has HIV.  Talk with your health care provider about whether you are at high risk of being infected with HIV. If you choose to begin PrEP, you should first be tested for HIV. You should then be tested every 3 months for as long as you are taking PrEP. Pregnancy  If you are premenopausal and you may become pregnant, ask your health care provider about preconception counseling.  If you may become pregnant, take 400 to 800 micrograms (mcg) of folic acid every day.  If you want to prevent pregnancy, talk to your  health care provider about birth control (contraception). Osteoporosis and menopause  Osteoporosis is a disease in which the bones lose minerals and strength with aging. This can result in serious bone fractures. Your risk for osteoporosis can be identified using a bone density scan.  If you are 38 years of age or older, or if you are at risk for osteoporosis and fractures, ask your health care provider if you should be screened.  Ask your health care provider whether you should take a calcium or vitamin D supplement to lower your risk for osteoporosis.  Menopause may have certain physical symptoms and risks.  Hormone replacement therapy may reduce some of these symptoms and risks. Talk to your health care provider about whether hormone replacement therapy is right for you. Follow these instructions at home:  Schedule regular health, dental, and eye exams.  Stay current with your immunizations.  Do not use any tobacco products including cigarettes, chewing tobacco, or electronic cigarettes.  If you are pregnant, do not drink alcohol.  If you are breastfeeding, limit how much and how often you drink alcohol.  Limit alcohol intake to no more than 1 drink per day for nonpregnant women. One drink equals 12 ounces of beer, 5 ounces of wine, or 1 ounces of hard liquor.  Do not use street drugs.  Do not share needles.  Ask your health care provider for help if you need support or information about quitting drugs.  Tell your health care provider if you often feel depressed.  Tell your health care provider if you have ever been abused or do not feel safe at home. This information is not intended to replace advice given to you by your health care provider. Make sure you discuss any questions you have with your health care provider. Document Released: 09/23/2010 Document Revised: 08/16/2015 Document Reviewed: 12/12/2014 Elsevier Interactive Patient Education  2018 [place].

## 2017-09-03 ENCOUNTER — Telehealth: Payer: Self-pay | Admitting: Internal Medicine

## 2017-09-14 ENCOUNTER — Telehealth: Payer: Self-pay | Admitting: Physician Assistant

## 2017-09-14 ENCOUNTER — Other Ambulatory Visit: Payer: Self-pay | Admitting: Internal Medicine

## 2017-09-14 DIAGNOSIS — E7800 Pure hypercholesterolemia, unspecified: Secondary | ICD-10-CM

## 2017-09-14 MED ORDER — ALBUTEROL SULFATE HFA 108 (90 BASE) MCG/ACT IN AERS: 2.0000 | INHALATION_SPRAY | Freq: Four times a day (QID) | RESPIRATORY_TRACT | 0 refills | Status: DC | PRN

## 2017-09-14 NOTE — Addendum Note (Signed)
Addended by: Zenovia JordanMITCHELL, Jaila Schellhorn B on: 09/14/2017 04:24 PM   Modules accepted: Orders

## 2017-09-14 NOTE — Telephone Encounter (Signed)
New Message    Patient is calling because she was advised at the end of June to have fasting labs. Not showing an order. Please call to discuss or schedule.

## 2017-09-14 NOTE — Telephone Encounter (Addendum)
Relation to pt: self  Call back number: [phone] [facility] [place] [address] [phone] (Phone) [phone] (Fax)    Reason for call:  Patient states she received a letter from [facility] on 09/04/17 stating "sorry for the delay and if insurance doesn't cover PROAIR HFA 108 (90 Base) MCG/ACT inhaler patient patient will be notified". Informed patient Rx was sent in on 09/03/17, patient has not received and would like a follow up call regarding if  PA is needed and if Rx can be re sent in, please advise.

## 2017-09-14 NOTE — Telephone Encounter (Signed)
PA denied. Spoke with pt to advise. Pt states she thinks she has always used Proair. According to chart has used Albuterol in the past. Will try to send in generic to pharmacy.

## 2017-09-14 NOTE — Telephone Encounter (Signed)
PA completed. Awaiting response. Key [id number]

## 2017-09-15 ENCOUNTER — Telehealth: Payer: Self-pay | Admitting: Internal Medicine

## 2017-09-15 NOTE — Telephone Encounter (Signed)
Lipid order placed and pts lab appt is scheduled for next Tuesday 09/22/17, as requested by the pt. Pt is aware to come fasting to this lab appt. Elizabeth CarsonMichelle Lenze PA-C ordered for this pt to have lipids rechecked, at her last lab done, that came back abnormal. Pt verbalized understanding and agrees with this plan.

## 2017-09-15 NOTE — Telephone Encounter (Signed)
Copied from CRM [id number]. Topic: Quick Communication - See Telephone Encounter >> Sep 15, 2017  2:53 PM Cipriano BunkerLambe, Annette S wrote: CRM for notification. See Telephone encounter for: 09/15/17.  Kateri PlummerReign [facility] [phone] Ref # [id number]   Insurance will not pay for Albuterol but will for Ventolin ,   they are asking for approval to switch to Ventolin They can take a verbal   albuterol (PROVENTIL HFA;VENTOLIN HFA) 108 (90 Base) MCG/ACT inhaler 1 Inhaler 0 09/14/2017   Sig - Route: Inhale 2 puffs into the lungs every 6 (six) hours as needed for wheezing or shortness of breath. - Inhalation  Sent to pharmacy as: albuterol (PROVENTIL HFA;VENTOLIN HFA) 108 (90 Base) MCG/ACT inhaler  Notes to Pharmacy: Office visit needed for further refills

## 2017-09-15 NOTE — Telephone Encounter (Signed)
Spoke with pharmacist to give verbal orders to change to Ventolin.

## 2017-09-22 ENCOUNTER — Other Ambulatory Visit: Payer: Medicare Other

## 2017-09-22 ENCOUNTER — Other Ambulatory Visit: Payer: Medicare Other | Admitting: *Deleted

## 2017-09-22 DIAGNOSIS — E7800 Pure hypercholesterolemia, unspecified: Secondary | ICD-10-CM

## 2017-09-22 LAB — LIPID PANEL
CHOL/HDL RATIO: 2.5 ratio (ref 0.0–4.4)
Cholesterol, Total: 245 mg/dL — ABNORMAL HIGH (ref 100–199)
HDL: 100 mg/dL (ref >39)
LDL CALC: 131 mg/dL — AB (ref 0–99)
Triglycerides: 72 mg/dL (ref 0–149)
VLDL Cholesterol Cal: 14 mg/dL (ref 5–40)

## 2017-09-23 ENCOUNTER — Other Ambulatory Visit: Payer: Self-pay | Admitting: *Deleted

## 2017-09-23 DIAGNOSIS — E7800 Pure hypercholesterolemia, unspecified: Secondary | ICD-10-CM

## 2017-09-23 MED ORDER — FENOFIBRATE 160 MG PO TABS: 160.0000 mg | ORAL_TABLET | Freq: Every day | ORAL | 3 refills | Status: DC

## 2017-09-30 ENCOUNTER — Telehealth: Payer: Self-pay | Admitting: Emergency Medicine

## 2017-09-30 MED ORDER — PROAIR HFA 108 (90 BASE) MCG/ACT IN AERS: 1.0000 | INHALATION_SPRAY | Freq: Four times a day (QID) | RESPIRATORY_TRACT | 8 refills | Status: DC | PRN

## 2017-09-30 NOTE — Telephone Encounter (Signed)
Copied from CRM [id number]. Topic: Quick Communication - See Telephone Encounter >> Sep 30, 2017 10:37 AM Herby AbrahamJohnson, Shiquita C wrote: CRM for notification. See Telephone encounter for: 09/30/17.   Pt called in, she said that she use to take Pro air and is now taking ;VENTOLIN HFA pt says that she was trying it to see if it works better.  pt says that change is not helping [employer] works better, pt would like to know if she could switch back. Pt would like to know if her insurance will cover change?   Please advise.   CB: [phone]

## 2017-09-30 NOTE — Telephone Encounter (Signed)
Spoke with pt to inform that RX would have to be sent to [facility] in order to find out if insurance will cover. Please send RX for Pro AIr to pharmacy, states ventolin is not working.

## 2017-09-30 NOTE — Telephone Encounter (Signed)
sent 

## 2017-10-19 ENCOUNTER — Other Ambulatory Visit: Payer: Self-pay | Admitting: Internal Medicine

## 2017-10-28 ENCOUNTER — Other Ambulatory Visit (INDEPENDENT_AMBULATORY_CARE_PROVIDER_SITE_OTHER): Payer: Medicare Other

## 2017-10-28 ENCOUNTER — Ambulatory Visit: Payer: Medicare Other | Admitting: Internal Medicine

## 2017-10-28 ENCOUNTER — Encounter: Payer: Self-pay | Admitting: Internal Medicine

## 2017-10-28 VITALS — BP 108/60 | HR 74 | Temp 98.4°F | Resp 16 | Wt 131.0 lb

## 2017-10-28 DIAGNOSIS — J4520 Mild intermittent asthma, uncomplicated: Secondary | ICD-10-CM

## 2017-10-28 DIAGNOSIS — E7800 Pure hypercholesterolemia, unspecified: Secondary | ICD-10-CM

## 2017-10-28 DIAGNOSIS — R739 Hyperglycemia, unspecified: Secondary | ICD-10-CM

## 2017-10-28 DIAGNOSIS — R002 Palpitations: Secondary | ICD-10-CM | POA: Diagnosis not present

## 2017-10-28 DIAGNOSIS — E02 Subclinical iodine-deficiency hypothyroidism: Secondary | ICD-10-CM | POA: Diagnosis not present

## 2017-10-28 DIAGNOSIS — L603 Nail dystrophy: Secondary | ICD-10-CM

## 2017-10-28 DIAGNOSIS — J309 Allergic rhinitis, unspecified: Secondary | ICD-10-CM

## 2017-10-28 LAB — CBC WITH DIFFERENTIAL/PLATELET
BASOS PCT: 1 % (ref 0.0–3.0)
Basophils Absolute: 0 10*3/uL (ref 0.0–0.1)
EOS ABS: 0.2 10*3/uL (ref 0.0–0.7)
Eosinophils Relative: 4.6 % (ref 0.0–5.0)
HCT: 38.4 % (ref 36.0–46.0)
HEMOGLOBIN: 13.3 g/dL (ref 12.0–15.0)
Lymphocytes Relative: 36.2 % (ref 12.0–46.0)
Lymphs Abs: 1.7 10*3/uL (ref 0.7–4.0)
MCHC: 34.6 g/dL (ref 30.0–36.0)
MCV: 89.7 fl (ref 78.0–100.0)
Monocytes Absolute: 0.6 10*3/uL (ref 0.1–1.0)
Monocytes Relative: 12.9 % — ABNORMAL HIGH (ref 3.0–12.0)
NEUTROS ABS: 2.1 10*3/uL (ref 1.4–7.7)
NEUTROS PCT: 45.3 % (ref 43.0–77.0)
PLATELETS: 294 10*3/uL (ref 150.0–400.0)
RBC: 4.28 Mil/uL (ref 3.87–5.11)
RDW: 13.6 % (ref 11.5–15.5)
WBC: 4.7 10*3/uL (ref 4.0–10.5)

## 2017-10-28 LAB — HEMOGLOBIN A1C: HEMOGLOBIN A1C: 5.5 % (ref 4.6–6.5)

## 2017-10-28 LAB — COMPREHENSIVE METABOLIC PANEL
ALBUMIN: 3.8 g/dL (ref 3.5–5.2)
ALT: 27 U/L (ref 0–35)
AST: 30 U/L (ref 0–37)
Alkaline Phosphatase: 33 U/L — ABNORMAL LOW (ref 39–117)
BUN: 16 mg/dL (ref 6–23)
CHLORIDE: 104 meq/L (ref 96–112)
CO2: 33 mEq/L — ABNORMAL HIGH (ref 19–32)
CREATININE: 0.78 mg/dL (ref 0.40–1.20)
Calcium: 9.5 mg/dL (ref 8.4–10.5)
GFR: 77.15 mL/min (ref >60.00)
Glucose, Bld: 106 mg/dL — ABNORMAL HIGH (ref 70–99)
Potassium: 3.8 mEq/L (ref 3.5–5.1)
SODIUM: 141 meq/L (ref 135–145)
Total Bilirubin: 0.5 mg/dL (ref 0.2–1.2)
Total Protein: 6.9 g/dL (ref 6.0–8.3)

## 2017-10-28 LAB — VITAMIN B12: VITAMIN B 12: 343 pg/mL (ref 211–911)

## 2017-10-28 LAB — TSH: TSH: 0.9 u[IU]/mL (ref 0.35–4.50)

## 2017-10-28 MED ORDER — MONTELUKAST SODIUM 10 MG PO TABS: 10.0000 mg | ORAL_TABLET | Freq: Every day | ORAL | 3 refills | Status: DC

## 2017-10-28 MED ORDER — PROAIR HFA 108 (90 BASE) MCG/ACT IN AERS: 1.0000 | INHALATION_SPRAY | Freq: Four times a day (QID) | RESPIRATORY_TRACT | 8 refills | Status: DC | PRN

## 2017-10-28 MED ORDER — FLUTICASONE-SALMETEROL 250-50 MCG/DOSE IN AEPB: INHALATION_SPRAY | RESPIRATORY_TRACT | 3 refills | Status: DC

## 2017-10-28 NOTE — Assessment & Plan Note (Signed)
Taking claritin Not ideally controlled Start singulair

## 2017-10-28 NOTE — Assessment & Plan Note (Signed)
Energy level good, weight stable, some increased hair loss Check tsh  Titrate med dose if needed

## 2017-10-28 NOTE — Patient Instructions (Addendum)
Albuterol inhalers - Proair, Ventolin, Proventil and Albuterol   Test(s) ordered today. Your results will be released to MyChart (or called to you) after review, usually within 72hours after test completion. If any changes need to be made, you will be notified at that same time.  Medications reviewed and updated.  Changes include trying singulair for your asthma.   Your prescription(s) have been submitted to your pharmacy. Please take as directed and contact our office if you believe you are having problem(s) with the medication(s).   Please followup in one year

## 2017-10-28 NOTE — Assessment & Plan Note (Addendum)
Uses Advair twice daily Uses rescue inhaler 2-3 times a day Taking claritin daily, uses flonase some days proair works very well but not covered Ventolin not as effective Will likely need prior auth for Proair  Has intermittent GERD - try taking otc zantac regularly  Trial of singulair

## 2017-10-28 NOTE — Assessment & Plan Note (Signed)
managed by cardiology Continue daily fibrate Regular exercise and healthy diet encouraged

## 2017-10-28 NOTE — Assessment & Plan Note (Signed)
Check a1c Low sugar / carb diet Stressed regular exercise   

## 2017-10-28 NOTE — Progress Notes (Signed)
Subjective:    Patient ID: Elizabeth Mills, female    DOB: 1945-04-23, 72 y.o.   MRN: [id number]  HPI The patient is here for follow up.  Asthma:  She uses her advair twice daily.  At this time of year due to allergies and the weather she uses the albuterol 2-3 times a day.  The proair works well for her but is no longer covered.  She has been using albuterol / ventolin and it does not work as well - she has to use it more often.  She has SOB, intermittent wheezing.    Hypothyroidism:  She is taking her medication daily.  She denies any recent changes in energy or weight that are unexplained. Her hair does fall out more recently .  Hyperlipidemia: She is taking her medication daily. She is compliant with a low fat/cholesterol diet. She is not exercising regularly. She denies myalgias.   Palpitations:  She follows with cardiology and is taking the metoprolol daily.  Her palpitations are well controlled.    Splitting of her nails:  She has noticed that her nails are splitting and her hair does fall out more.  She wonders about a nutritional def.   Medications and allergies reviewed with patient and updated if appropriate.  Patient Active Problem List   Diagnosis Date Noted  . Hyperglycemia 10/28/2017  . Splitting of nail 10/28/2017  . Claudication (HCC) 01/15/2017  . Nephrolithiasis 09/22/2016  . Osteopenia 06/27/2015  . Amblyopia of right eye 03/07/2014  . Nuclear sclerosis of both eyes 03/07/2014  . Hip bursitis 01/25/2014  . Rapid palpitations 07/28/2012  . Alopecia 07/28/2012  . Nuclear cataract 02/04/2012  . Asthma   . Allergic rhinitis   . Hypercholesterolemia   . Back pain   . Colitis   . Hypothyroidism     Current Outpatient Medications on File Prior to Visit  Medication Sig Dispense Refill  . aspirin 81 MG tablet Take 81 mg by mouth as needed (blood thinner).     . Cholecalciferol (VITAMIN D PO) Take 1 tablet by mouth daily.    Marland Kitchen estradiol (ESTRACE) 0.1 MG/GM  vaginal cream Place 1 Applicatorful vaginally once a week.    . fenofibrate 160 MG tablet Take 1 tablet (160 mg total) by mouth daily. 90 tablet 3  . fluticasone (FLONASE) 50 MCG/ACT nasal spray PLACE 2 SPRAYS INTO BOTH NOSTRILS DAILY AS NEEDED FOR ALLERGIES OR RHINITIS (ALLERGIES). 16 g 1  . loratadine (CLARITIN) 10 MG tablet Take 10 mg by mouth daily as needed for allergies (allergies).    . metoprolol succinate (TOPROL-XL) 25 MG 24 hr tablet TAKE ONE-HALF (1/2) TABLET DAILY 45 tablet 2  . SYNTHROID 75 MCG tablet Take 1 tablet (75 mcg total) by mouth daily before breakfast. -- Office visit needed for further refills 90 tablet 0  . albuterol (PROVENTIL HFA;VENTOLIN HFA) 108 (90 Base) MCG/ACT inhaler Inhale 2 puffs into the lungs every 6 (six) hours as needed for wheezing or shortness of breath. (Patient not taking: Reported on 10/28/2017) 1 Inhaler 0   No current facility-administered medications on file prior to visit.     Past Medical History:  Diagnosis Date  . Allergic rhinitis, cause unspecified   . Asthma   . Back pain   . Colitis   . Diverticulitis   . Generalized headaches   . GERD (gastroesophageal reflux disease)   . Hypercholesterolemia   . Hypothyroidism     Past Surgical History:  Procedure Laterality Date  .  ABDOMINAL HYSTERECTOMY  1983  . APPENDECTOMY  1955  . BACK SURGERY    . BREAST BIOPSY  1982  . BREAST EXCISIONAL BIOPSY Bilateral   . COLONOSCOPY    . EYE SURGERY  11/14/2009, 1990   blocked tear duct repair  . TONSILLECTOMY  1953    Social History   Socioeconomic History  . Marital status: Married    Spouse name: Not on file  . Number of children: 2  . Years of education: 3213  . Highest education level: Not on file  Occupational History  . Occupation: Futures traderHomemaker  Social Needs  . Financial resource strain: Not hard at all  . Food insecurity:    Worry: Never true    Inability: Never true  . Transportation needs:    Medical: No    Non-medical: No    Tobacco Use  . Smoking status: Never Smoker  . Smokeless tobacco: Never Used  . Tobacco comment: married, lives with spouse - retired  Substance and Sexual Activity  . Alcohol use: No  . Drug use: No  . Sexual activity: Not Currently  Lifestyle  . Physical activity:    Days per week: 4 days    Minutes per session: 60 min  . Stress: To some extent  Relationships  . Social connections:    Talks on phone: More than three times a week    Gets together: More than three times a week    Attends religious service: 1 to 4 times per year    Active member of club or organization: Yes    Attends meetings of clubs or organizations: 1 to 4 times per year    Relationship status: Married  Other Topics Concern  . Not on file  Social History Narrative   Regular exercise-yes, walk 2-3 times a week   Caffeine Use-no    Family History  Problem Relation Age of Onset  . Cancer Mother        Kidney and Ovarian Cancer  . Arthritis Mother   . Hyperlipidemia Mother   . Arthritis Father   . Hyperlipidemia Father   . Breast cancer Neg Hx     Review of Systems  Constitutional: Negative for chills and fever.  Respiratory: Positive for shortness of breath (a little). Negative for cough, chest tightness and wheezing (rare).   Cardiovascular: Positive for palpitations (rare). Negative for chest pain and leg swelling.  Gastrointestinal:       Genella RifeGerd intermittently  Neurological: Negative for light-headedness and headaches.       Objective:   Vitals:   10/28/17 1437  BP: 108/60  Pulse: 74  Resp: 16  Temp: 98.4 F (36.9 C)  SpO2: 98%   BP Readings from Last 3 Encounters:  10/28/17 108/60  08/27/17 116/62  07/07/17 116/72   Wt Readings from Last 3 Encounters:  10/28/17 131 lb (59.4 kg)  08/27/17 131 lb (59.4 kg)  07/07/17 130 lb (59 kg)   Body mass index is 23.21 kg/m.   Physical Exam    Constitutional: Appears well-developed and well-nourished. No distress.  HENT:  Head:  Normocephalic and atraumatic.  Neck: Neck supple. No tracheal deviation present. No thyromegaly present.  No cervical lymphadenopathy Cardiovascular: Normal rate, regular rhythm and normal heart sounds.   No murmur heard. No carotid bruit .  No edema Pulmonary/Chest: Effort normal and breath sounds normal. No respiratory distress. No has no wheezes. No rales.  Skin: Skin is warm and dry. Not diaphoretic.  Psychiatric: Normal mood  and affect. Behavior is normal.      Assessment & Plan:    See Problem List for Assessment and Plan of chronic medical problems.

## 2017-10-28 NOTE — Assessment & Plan Note (Signed)
Controlled Follows with cardiology On metoprolol

## 2017-10-28 NOTE — Assessment & Plan Note (Signed)
?   B def Check B12 level Avoid too many cleaning chemicals and keeping hands in water too much  - wear gloves Try topical hardening polish

## 2017-10-29 ENCOUNTER — Other Ambulatory Visit: Payer: Self-pay | Admitting: Emergency Medicine

## 2017-10-29 ENCOUNTER — Encounter: Payer: Self-pay | Admitting: Internal Medicine

## 2017-10-29 ENCOUNTER — Telehealth: Payer: Self-pay | Admitting: Internal Medicine

## 2017-10-29 MED ORDER — ALBUTEROL SULFATE HFA 108 (90 BASE) MCG/ACT IN AERS: 2.0000 | INHALATION_SPRAY | Freq: Four times a day (QID) | RESPIRATORY_TRACT | 1 refills | Status: DC | PRN

## 2017-10-29 NOTE — Telephone Encounter (Signed)
Copied from CRM [phone]. Topic: General - Other >> Oct 29, 2017  3:57 PM Tamela OddiHarris, Sebastian Lurz J wrote: Reason for CRM: Steward DroneBrenda with [facility] called to inform doctor that the Rx for Albuterol inhaler is not covered under the insurance plan.  She indicated that the ventolin was covered and would like to know if doctor wants patient to get that inhaler instead.  Please advise.  CB# [phone], Reference # [id number].

## 2017-11-02 NOTE — Telephone Encounter (Signed)
Elizabeth Mills with [facility] calling and states that they are needing a response to this coverage issue. States that the prescription will be returned tomorrow 11/03/17 at the end of the day, if there is no response. Please advise. Ref#: [id number]

## 2017-11-02 NOTE — Telephone Encounter (Signed)
Contacted pharmacy. Unable to proceed without PA and documentation that pt can not use Ventolin

## 2017-11-04 NOTE — Telephone Encounter (Signed)
Spoke with [facility] to advise PA had been approved. Informed that Ventolin has already been shipped to pt. Made pharmacist aware that pt can not use Ventolin. States he will attempt to put a credit on her account and cancel the RX for albuterol/ ventolin. Proair getting shipped to pt. Spoke with pt to advise what was discussed with [facility].

## 2017-11-04 NOTE — Telephone Encounter (Signed)
PA form faxed to [facility]

## 2017-11-17 ENCOUNTER — Ambulatory Visit: Payer: Medicare Other | Admitting: Internal Medicine

## 2017-11-17 ENCOUNTER — Encounter: Payer: Self-pay | Admitting: Internal Medicine

## 2017-11-17 VITALS — BP 114/70 | HR 72 | Temp 98.1°F | Resp 16 | Ht 63.0 in | Wt 131.8 lb

## 2017-11-17 DIAGNOSIS — J029 Acute pharyngitis, unspecified: Secondary | ICD-10-CM | POA: Insufficient documentation

## 2017-11-17 LAB — POCT RAPID STREP A (OFFICE): RAPID STREP A SCREEN: NEGATIVE

## 2017-11-17 NOTE — Patient Instructions (Addendum)
Your infection is likely viral in nature.  Continue the advil as needed.  Take any over the counter medications as needed.   Rest and fluids.  Call if no improvement or if your symptoms worsen     Upper Respiratory Infection, Adult Most upper respiratory infections (URIs) are a viral infection of the air passages leading to the lungs. A URI affects the nose, throat, and upper air passages. The most common type of URI is nasopharyngitis and is typically referred to as "the common cold." URIs run their course and usually go away on their own. Most of the time, a URI does not require medical attention, but sometimes a bacterial infection in the upper airways can follow a viral infection. This is called a secondary infection. Sinus and middle ear infections are common types of secondary upper respiratory infections. Bacterial pneumonia can also complicate a URI. A URI can worsen asthma and chronic obstructive pulmonary disease (COPD). Sometimes, these complications can require emergency medical care and may be life threatening. What are the causes? Almost all URIs are caused by viruses. A virus is a type of germ and can spread from one person to another. What increases the risk? You may be at risk for a URI if:  You smoke.  You have chronic heart or lung disease.  You have a weakened defense (immune) system.  You are very young or very old.  You have nasal allergies or asthma.  You work in crowded or poorly ventilated areas.  You work in health care facilities or schools.  What are the signs or symptoms? Symptoms typically develop 2-3 days after you come in contact with a cold virus. Most viral URIs last 7-10 days. However, viral URIs from the influenza virus (flu virus) can last 14-18 days and are typically more severe. Symptoms may include:  Runny or stuffy (congested) nose.  Sneezing.  Cough.  Sore throat.  Headache.  Fatigue.  Fever.  Loss of appetite.  Pain in  your forehead, behind your eyes, and over your cheekbones (sinus pain).  Muscle aches.  How is this diagnosed? Your health care provider may diagnose a URI by:  Physical exam.  Tests to check that your symptoms are not due to another condition such as: ? Strep throat. ? Sinusitis. ? Pneumonia. ? Asthma.  How is this treated? A URI goes away on its own with time. It cannot be cured with medicines, but medicines may be prescribed or recommended to relieve symptoms. Medicines may help:  Reduce your fever.  Reduce your cough.  Relieve nasal congestion.  Follow these instructions at home:  Take medicines only as directed by your health care provider.  Gargle warm saltwater or take cough drops to comfort your throat as directed by your health care provider.  Use a warm mist humidifier or inhale steam from a shower to increase air moisture. This may make it easier to breathe.  Drink enough fluid to keep your urine clear or pale yellow.  Eat soups and other clear broths and maintain good nutrition.  Rest as needed.  Return to work when your temperature has returned to normal or as your health care provider advises. You may need to stay home longer to avoid infecting others. You can also use a face mask and careful hand washing to prevent spread of the virus.  Increase the usage of your inhaler if you have asthma.  Do not use any tobacco products, including cigarettes, chewing tobacco, or electronic cigarettes. If you  need help quitting, ask your health care provider. How is this prevented? The best way to protect yourself from getting a cold is to practice good hygiene.  Avoid oral or hand contact with people with cold symptoms.  Wash your hands often if contact occurs.  There is no clear evidence that vitamin C, vitamin E, echinacea, or exercise reduces the chance of developing a cold. However, it is always recommended to get plenty of rest, exercise, and practice good  nutrition. Contact a health care provider if:  You are getting worse rather than better.  Your symptoms are not controlled by medicine.  You have chills.  You have worsening shortness of breath.  You have brown or red mucus.  You have yellow or brown nasal discharge.  You have pain in your face, especially when you bend forward.  You have a fever.  You have swollen neck glands.  You have pain while swallowing.  You have white areas in the back of your throat. Get help right away if:  You have severe or persistent: ? Headache. ? Ear pain. ? Sinus pain. ? Chest pain.  You have chronic lung disease and any of the following: ? Wheezing. ? Prolonged cough. ? Coughing up blood. ? A change in your usual mucus.  You have a stiff neck.  You have changes in your: ? Vision. ? Hearing. ? Thinking. ? Mood. This information is not intended to replace advice given to you by your health care provider. Make sure you discuss any questions you have with your health care provider. Document Released: 09/03/2000 Document Revised: 11/11/2015 Document Reviewed: 06/15/2013 Elsevier Interactive Patient Education  [employer]2018 Else[employer] Inc[employer]

## 2017-11-17 NOTE — Progress Notes (Signed)
Subjective:    Patient ID: GIANA CASTNER, female    DOB: 18-Mar-1946, 72 y.o.   MRN: [id number]  HPI She is here for an acute visit for cold symptoms.  Her symptoms started 3 days ago.   She is experiencing fatigue, subjective fever, fever the next day of 100.2, body aches, sore throat, decreased appetite and lightheadedness.  She denies congestion, ear pain, sinus pain, cough, wheeze and SOB.  She denies nausea and diarrhea..   She has taken advil.   She does feel a little better today.   Her granddaughter was diagnosed with strep.   Medications and allergies reviewed with patient and updated if appropriate.  Patient Active Problem List   Diagnosis Date Noted  . Hyperglycemia 10/28/2017  . Splitting of nail 10/28/2017  . Claudication (HCC) 01/15/2017  . Nephrolithiasis 09/22/2016  . Osteopenia 06/27/2015  . Amblyopia of right eye 03/07/2014  . Nuclear sclerosis of both eyes 03/07/2014  . Hip bursitis 01/25/2014  . Rapid palpitations 07/28/2012  . Alopecia 07/28/2012  . Nuclear cataract 02/04/2012  . Asthma   . Allergic rhinitis   . Hypercholesterolemia   . Back pain   . Colitis   . Hypothyroidism     Current Outpatient Medications on File Prior to Visit  Medication Sig Dispense Refill  . albuterol (PROVENTIL HFA;VENTOLIN HFA) 108 (90 Base) MCG/ACT inhaler Inhale 2 puffs into the lungs every 6 (six) hours as needed for wheezing or shortness of breath. 3 Inhaler 1  . aspirin 81 MG tablet Take 81 mg by mouth as needed (blood thinner).     . Cholecalciferol (VITAMIN D PO) Take 1 tablet by mouth daily.    Marland Kitchen estradiol (ESTRACE) 0.1 MG/GM vaginal cream Place 1 Applicatorful vaginally once a week.    . fenofibrate 160 MG tablet Take 1 tablet (160 mg total) by mouth daily. 90 tablet 3  . fluticasone (FLONASE) 50 MCG/ACT nasal spray PLACE 2 SPRAYS INTO BOTH NOSTRILS DAILY AS NEEDED FOR ALLERGIES OR RHINITIS (ALLERGIES). 16 g 1  . Fluticasone-Salmeterol (ADVAIR DISKUS)  250-50 MCG/DOSE AEPB USE 1 INHALATION EVERY 12 HOURS (NEED APPOINTMENT WITH PRIMARY CARE PHYSICIAN FOR FURTHER REFILLS) 180 each 3  . loratadine (CLARITIN) 10 MG tablet Take 10 mg by mouth daily as needed for allergies (allergies).    . metoprolol succinate (TOPROL-XL) 25 MG 24 hr tablet TAKE ONE-HALF (1/2) TABLET DAILY 45 tablet 2  . montelukast (SINGULAIR) 10 MG tablet Take 1 tablet (10 mg total) by mouth at bedtime. 30 tablet 3  . PROAIR HFA 108 (90 Base) MCG/ACT inhaler Inhale 1-2 puffs into the lungs every 6 (six) hours as needed for wheezing or shortness of breath. 1 Inhaler 8  . SYNTHROID 75 MCG tablet Take 1 tablet (75 mcg total) by mouth daily before breakfast. -- Office visit needed for further refills 90 tablet 0   No current facility-administered medications on file prior to visit.     Past Medical History:  Diagnosis Date  . Allergic rhinitis, cause unspecified   . Asthma   . Back pain   . Colitis   . Diverticulitis   . Generalized headaches   . GERD (gastroesophageal reflux disease)   . Hypercholesterolemia   . Hypothyroidism     Past Surgical History:  Procedure Laterality Date  . ABDOMINAL HYSTERECTOMY  1983  . APPENDECTOMY  1955  . BACK SURGERY    . BREAST BIOPSY  1982  . BREAST EXCISIONAL BIOPSY Bilateral   . COLONOSCOPY    .  EYE SURGERY  11/14/2009, 1990   blocked tear duct repair  . TONSILLECTOMY  1953    Social History   Socioeconomic History  . Marital status: Married    Spouse name: Not on file  . Number of children: 2  . Years of education: 6913  . Highest education level: Not on file  Occupational History  . Occupation: Futures traderHomemaker  Social Needs  . Financial resource strain: Not hard at all  . Food insecurity:    Worry: Never true    Inability: Never true  . Transportation needs:    Medical: No    Non-medical: No  Tobacco Use  . Smoking status: Never Smoker  . Smokeless tobacco: Never Used  . Tobacco comment: married, lives with spouse -  retired  Substance and Sexual Activity  . Alcohol use: No  . Drug use: No  . Sexual activity: Not Currently  Lifestyle  . Physical activity:    Days per week: 4 days    Minutes per session: 60 min  . Stress: To some extent  Relationships  . Social connections:    Talks on phone: More than three times a week    Gets together: More than three times a week    Attends religious service: 1 to 4 times per year    Active member of club or organization: Yes    Attends meetings of clubs or organizations: 1 to 4 times per year    Relationship status: Married  Other Topics Concern  . Not on file  Social History Narrative   Regular exercise-yes, walk 2-3 times a week   Caffeine Use-no    Family History  Problem Relation Age of Onset  . Cancer Mother        Kidney and Ovarian Cancer  . Arthritis Mother   . Hyperlipidemia Mother   . Arthritis Father   . Hyperlipidemia Father   . Breast cancer Neg Hx     Review of Systems  Constitutional: Positive for appetite change (decreased), fatigue and fever.  HENT: Positive for sore throat. Negative for congestion, ear pain and sinus pain.   Respiratory: Negative for cough, shortness of breath and wheezing.   Gastrointestinal: Negative for diarrhea and nausea.  Musculoskeletal: Positive for myalgias.  Neurological: Positive for light-headedness. Negative for headaches.       Objective:   Vitals:   11/17/17 1432  BP: 114/70  Pulse: 72  Resp: 16  Temp: 98.1 F (36.7 C)  SpO2: 98%   Filed Weights   11/17/17 1432  Weight: 131 lb 12.8 oz (59.8 kg)   Body mass index is 23.35 kg/m.  Wt Readings from Last 3 Encounters:  11/17/17 131 lb 12.8 oz (59.8 kg)  10/28/17 131 lb (59.4 kg)  08/27/17 131 lb (59.4 kg)     Physical Exam GENERAL APPEARANCE: Appears stated age, well appearing, NAD EYES: conjunctiva clear, no icterus HEENT: bilateral tympanic membranes and ear canals normal, oropharynx with mild erythema, no thyromegaly,  trachea midline, no cervical or supraclavicular lymphadenopathy LUNGS: Clear to auscultation without wheeze or crackles, unlabored breathing, good air entry bilaterally CARDIOVASCULAR: Normal S1,S2 without murmurs, no edema SKIN: warm, dry        Assessment & Plan:   See Problem List for Assessment and Plan of chronic medical problems.

## 2017-11-17 NOTE — Assessment & Plan Note (Signed)
Rapid strep neg Symptoms consistent with a viral URI Continue advil, symptomatic treatment Rest, fluids  Call if no improvement

## 2017-12-01 ENCOUNTER — Encounter: Payer: Self-pay | Admitting: Physician Assistant

## 2017-12-01 NOTE — Progress Notes (Addendum)
Cardiology Office Note    Date:  12/02/2017  ID:  Elizabeth Mills, DOB [birth year], MRN [id number] PCP:  Pincus Sanes, MD  Cardiologist:  Tobias Alexander, MD   Chief Complaint: shortness of breath  History of Present Illness:  Elizabeth Mills is a 72 y.o. female with history of palpitations/PSVT per Dr. Yevonne Pax remote note, HLD, hypothyroidism, asthma, colitis, GERD, LVH, aortic valve sclerosis presents for evaluation of SOB. Remote echo 2008 showed mild-mod LVH, EF normal, + impared relaxation, mild aortic valve sclerosis without stenosis. Last labs 10/2017 showed Hgb 13.3, K 3.8, Cr 0.78, AST/ALT OK, TSH wnl, 09/2017 LDL 131. She is intolerant of statins and has been treated with fenofibrate.   She returns for evaluation of SOB. About 6 weeks ago she states she had a change in her ProAir to generic and she noticed an uptick in the SOB/wheezing that was consistent with prior asthma. About 1 month ago she reports she had increased stress/responsibilities with grandchildren so stopped walking regularly. Around that time she was able to change back to ProAir and had some improvement, but also has noticed some continued dyspnea beyond her norm. She has chronic dyspnea walking up stairs but more recently has noticed it with regular walking and sometimes just with standing as well. She will also notice her heart rate go up while standing. The humidity may make it worse. She sometimes feels that expanding her bra helps. No significant chest pain, orthopnea, PND, LEE, weight gain, cough, fevers, chills, known blood loss. She has had to take Pepcid occasionally for GERD recently (throat burning after eating). Paternal grandfather had unknown heart issue. She does not smoke or drink.   Past Medical History:  Diagnosis Date  . Allergic rhinitis, cause unspecified   . Asthma   . Back pain   . Colitis   . Diverticulitis   . Generalized headaches   . GERD (gastroesophageal reflux disease)   .  Hypercholesterolemia   . Hypothyroidism   . LVH (left ventricular hypertrophy)   . PSVT (paroxysmal supraventricular tachycardia) (HCC)   . Statin intolerance     Past Surgical History:  Procedure Laterality Date  . ABDOMINAL HYSTERECTOMY  1983  . APPENDECTOMY  1955  . BACK SURGERY    . BREAST BIOPSY  1982  . BREAST EXCISIONAL BIOPSY Bilateral   . COLONOSCOPY    . EYE SURGERY  11/14/2009, 1990   blocked tear duct repair  . TONSILLECTOMY  1953    Current Medications: Current Meds  Medication Sig  . aspirin 81 MG tablet Take 81 mg by mouth as needed (blood thinner).   . Cholecalciferol (VITAMIN D PO) Take 1 tablet by mouth daily.  Marland Kitchen estradiol (ESTRACE) 0.1 MG/GM vaginal cream Place 1 Applicatorful vaginally once a week.  . fenofibrate 160 MG tablet Take 1 tablet (160 mg total) by mouth daily.  . fluticasone (FLONASE) 50 MCG/ACT nasal spray PLACE 2 SPRAYS INTO BOTH NOSTRILS DAILY AS NEEDED FOR ALLERGIES OR RHINITIS (ALLERGIES).  . Fluticasone-Salmeterol (ADVAIR DISKUS) 250-50 MCG/DOSE AEPB USE 1 INHALATION EVERY 12 HOURS (NEED APPOINTMENT WITH PRIMARY CARE PHYSICIAN FOR FURTHER REFILLS)  . loratadine (CLARITIN) 10 MG tablet Take 10 mg by mouth daily as needed for allergies (allergies).  . metoprolol succinate (TOPROL-XL) 25 MG 24 hr tablet TAKE ONE-HALF (1/2) TABLET DAILY  . montelukast (SINGULAIR) 10 MG tablet Take 1 tablet (10 mg total) by mouth at bedtime.  Marland Kitchen PROAIR HFA 108 (90 Base) MCG/ACT inhaler Inhale 1-2 puffs into  the lungs every 6 (six) hours as needed for wheezing or shortness of breath.  . SYNTHROID 75 MCG tablet Take 1 tablet (75 mcg total) by mouth daily before breakfast. -- Office visit needed for further refills  Allergies:   Crestor [rosuvastatin calcium]; Lipitor [atorvastatin calcium]; Phenergan [promethazine hcl]; Pravachol; Promethazine; Promethazine hcl; Sulfa antibiotics; Sulfamethoxazole; Zetia [ezetimibe]; and Zocor [simvastatin - high dose]   Social History    Socioeconomic History  . Marital status: Married    Spouse name: Not on file  . Number of children: 2  . Years of education: 71  . Highest education level: Not on file  Occupational History  . Occupation: Futures trader  Social Needs  . Financial resource strain: Not hard at all  . Food insecurity:    Worry: Never true    Inability: Never true  . Transportation needs:    Medical: No    Non-medical: No  Tobacco Use  . Smoking status: Never Smoker  . Smokeless tobacco: Never Used  . Tobacco comment: married, lives with spouse - retired  Substance and Sexual Activity  . Alcohol use: No  . Drug use: No  . Sexual activity: Not Currently  Lifestyle  . Physical activity:    Days per week: 4 days    Minutes per session: 60 min  . Stress: To some extent  Relationships  . Social connections:    Talks on phone: More than three times a week    Gets together: More than three times a week    Attends religious service: 1 to 4 times per year    Active member of club or organization: Yes    Attends meetings of clubs or organizations: 1 to 4 times per year    Relationship status: Married  Other Topics Concern  . Not on file  Social History Narrative   Regular exercise-yes, walk 2-3 times a week   Caffeine Use-no     Family History:  The patient's family history includes Arthritis in her father and mother; Cancer in her mother; Hyperlipidemia in her father and mother. There is no history of Breast cancer.  ROS:   Please see the history of present illness. No wheezing All other systems are reviewed and otherwise negative.    PHYSICAL EXAM:   VS:  BP 116/68   Pulse 67   Ht 5\' 3"  (1.6 m)   Wt 129 lb 12.8 oz (58.9 kg)   SpO2 97%   BMI 22.99 kg/m   BMI: Body mass index is 22.99 kg/m. GEN: Well nourished, well developed WF, in no acute distress HEENT: normocephalic, atraumatic Neck: no JVD, carotid bruits, or masses Cardiac: RRR; no murmurs, rubs, or gallops, no edema    Respiratory:  clear to auscultation bilaterally, normal work of breathing GI: soft, nontender, nondistended, + BS MS: no deformity or atrophy Skin: warm and dry, no rash Neuro:  Alert and Oriented x 3, Strength and sensation are intact, follows commands Psych: euthymic mood, full affect  Wt Readings from Last 3 Encounters:  12/02/17 129 lb 12.8 oz (58.9 kg)  11/17/17 131 lb 12.8 oz (59.8 kg)  10/28/17 131 lb (59.4 kg)      Studies/Labs Reviewed:   EKG:  EKG was ordered today and personally reviewed by me and demonstrates NSR 67bpm, lower voltage QRS, no acute changes from prior.  Recent Labs: 10/28/2017: ALT 27; BUN 16; Creatinine, Ser 0.78; Hemoglobin 13.3; Platelets 294.0; Potassium 3.8; Sodium 141; TSH 0.90   Lipid Panel  Component Value Date/Time   CHOL 245 (H) 09/22/2017 1053   TRIG 72 09/22/2017 1053   HDL 100 09/22/2017 1053   CHOLHDL 2.5 09/22/2017 1053   CHOLHDL 2.5 04/26/2015 0939   VLDL 12 04/26/2015 0939   LDLCALC 131 (H) 09/22/2017 1053   LDLDIRECT 94.2 07/28/2012 1010    Additional studies/ records that were reviewed today include: Summarized above.   ASSESSMENT & PLAN:   1. Shortness of breath - somewhat atypical in nature in that it sometimes occurs with just standing as well. Will update labs to exclude new anemia or dehydration causing an orthostatic response in HR, and also check BNP. Plan 2D echocardiogram to re-evaluate cardiac structure. If unrevealing would consider exercise nuclear stress test to further evaluate. I will also add omeprazole 20mg  daily given recent GERD/asthma issues. She is not tachycardic, tachypneic or hypoxic. 2. GERD - as above, add PPI. 3. LVH - reassess by echo. 4. Hyperlipidemia w/ hx of statin intolerance - she has been intolerant to essentially all statins and Zetia. She is on fenofibrate but has normal triglycerides. I reviewed UpToDate who does not recommend fenofibrate for treatment of elevated LDL. Will refer to lipid  clinic to help assist. I am not sure if she would qualify for a PCSK9 as she does not have documented atherosclerotic disease, but obviously this will be reassessed periodically.  Disposition: F/u with myself in 4-6 weeks.   Medication Adjustments/Labs and Tests Ordered: Current medicines are reviewed at length with the patient today.  Concerns regarding medicines are outlined above. Medication changes, Labs and Tests ordered today are summarized above and listed in the Patient Instructions accessible in Encounters.   Signed, Laurann Montana, PA-C  12/02/2017 3:53 PM    [facility] [address] [place], [place], [place]  [address] Phone: [phone]; Fax: [phone]

## 2017-12-02 ENCOUNTER — Encounter: Payer: Self-pay | Admitting: Physician Assistant

## 2017-12-02 ENCOUNTER — Ambulatory Visit (INDEPENDENT_AMBULATORY_CARE_PROVIDER_SITE_OTHER): Payer: Medicare Other | Admitting: Physician Assistant

## 2017-12-02 VITALS — BP 116/68 | HR 67 | Ht 63.0 in | Wt 129.8 lb

## 2017-12-02 DIAGNOSIS — E785 Hyperlipidemia, unspecified: Secondary | ICD-10-CM | POA: Diagnosis not present

## 2017-12-02 DIAGNOSIS — I517 Cardiomegaly: Secondary | ICD-10-CM

## 2017-12-02 DIAGNOSIS — K219 Gastro-esophageal reflux disease without esophagitis: Secondary | ICD-10-CM

## 2017-12-02 DIAGNOSIS — R0602 Shortness of breath: Secondary | ICD-10-CM | POA: Diagnosis not present

## 2017-12-02 DIAGNOSIS — Z789 Other specified health status: Secondary | ICD-10-CM

## 2017-12-02 MED ORDER — OMEPRAZOLE 20 MG PO CPDR: 20.0000 mg | DELAYED_RELEASE_CAPSULE | Freq: Every day | ORAL | 11 refills | Status: DC

## 2017-12-02 NOTE — Patient Instructions (Signed)
Your physician recommends that you continue on your current medications as directed. Please refer to the Current Medication list given to you today.  Your physician recommends that you return for lab work in: today (bmet, cbc, bnp)  Your physician has requested that you have an echocardiogram. Echocardiography is a painless test that uses sound waves to create images of your heart. It provides your doctor with information about the size and shape of your heart and how well your heart's chambers and valves are working. This procedure takes approximately one hour. There are no restrictions for this procedure.  You have been referred to lipid clinic for statin intolerance.   Please schedule morning appointment so that patient can come fasting.  Your physician recommends that you schedule a follow-up appointment in: 4-6 weeks with Ronie Spies, PA-C

## 2017-12-03 LAB — BASIC METABOLIC PANEL
BUN / CREAT RATIO: 22 (ref 12–28)
BUN: 19 mg/dL (ref 8–27)
CO2: 25 mmol/L (ref 20–29)
CREATININE: 0.86 mg/dL (ref 0.57–1.00)
Calcium: 9.6 mg/dL (ref 8.7–10.3)
Chloride: 102 mmol/L (ref 96–106)
GFR, EST AFRICAN AMERICAN: 78 mL/min/{1.73_m2} (ref >59)
GFR, EST NON AFRICAN AMERICAN: 68 mL/min/{1.73_m2} (ref >59)
Glucose: 80 mg/dL (ref 65–99)
Potassium: 4.5 mmol/L (ref 3.5–5.2)
SODIUM: 141 mmol/L (ref 134–144)

## 2017-12-03 LAB — CBC
HEMATOCRIT: 38.5 % (ref 34.0–46.6)
Hemoglobin: 13.2 g/dL (ref 11.1–15.9)
MCH: 30.7 pg (ref 26.6–33.0)
MCHC: 34.3 g/dL (ref 31.5–35.7)
MCV: 90 fL (ref 79–97)
Platelets: 374 10*3/uL (ref 150–450)
RBC: 4.3 x10E6/uL (ref 3.77–5.28)
RDW: 14 % (ref 12.3–15.4)
WBC: 4.5 10*3/uL (ref 3.4–10.8)

## 2017-12-03 LAB — PRO B NATRIURETIC PEPTIDE: NT-Pro BNP: 105 pg/mL (ref 0–301)

## 2017-12-03 NOTE — Progress Notes (Signed)
Pt has been made aware of normal result and verbalized understanding.  jw 12/03/17

## 2017-12-10 ENCOUNTER — Ambulatory Visit (HOSPITAL_COMMUNITY): Payer: Medicare Other | Attending: Cardiology

## 2017-12-10 ENCOUNTER — Other Ambulatory Visit: Payer: Self-pay

## 2017-12-10 DIAGNOSIS — I517 Cardiomegaly: Secondary | ICD-10-CM | POA: Diagnosis present

## 2017-12-10 DIAGNOSIS — R0602 Shortness of breath: Secondary | ICD-10-CM | POA: Insufficient documentation

## 2017-12-10 DIAGNOSIS — Z789 Other specified health status: Secondary | ICD-10-CM | POA: Insufficient documentation

## 2017-12-10 DIAGNOSIS — I071 Rheumatic tricuspid insufficiency: Secondary | ICD-10-CM | POA: Diagnosis not present

## 2017-12-10 DIAGNOSIS — E785 Hyperlipidemia, unspecified: Secondary | ICD-10-CM | POA: Diagnosis not present

## 2017-12-10 DIAGNOSIS — K219 Gastro-esophageal reflux disease without esophagitis: Secondary | ICD-10-CM | POA: Insufficient documentation

## 2017-12-13 ENCOUNTER — Other Ambulatory Visit: Payer: Self-pay | Admitting: Internal Medicine

## 2017-12-14 NOTE — Progress Notes (Signed)
Pt has been made aware of normal result and verbalized understanding.  jw 12/14/17

## 2017-12-15 ENCOUNTER — Encounter: Payer: Self-pay | Admitting: Pharmacist

## 2017-12-15 ENCOUNTER — Ambulatory Visit (INDEPENDENT_AMBULATORY_CARE_PROVIDER_SITE_OTHER): Payer: Medicare Other | Admitting: Pharmacist

## 2017-12-15 ENCOUNTER — Other Ambulatory Visit: Payer: Medicare Other

## 2017-12-15 DIAGNOSIS — E7800 Pure hypercholesterolemia, unspecified: Secondary | ICD-10-CM

## 2017-12-15 LAB — LIPID PANEL
CHOL/HDL RATIO: 1.8 ratio (ref 0.0–4.4)
CHOLESTEROL TOTAL: 194 mg/dL (ref 100–199)
HDL: 109 mg/dL (ref >39)
LDL Calculated: 75 mg/dL (ref 0–99)
TRIGLYCERIDES: 52 mg/dL (ref 0–149)
VLDL Cholesterol Cal: 10 mg/dL (ref 5–40)

## 2017-12-15 MED ORDER — ROSUVASTATIN CALCIUM 5 MG PO TABS: ORAL_TABLET | ORAL | 3 refills | Status: DC

## 2017-12-15 NOTE — Progress Notes (Signed)
Patient ID: Elizabeth Batmanlizabeth G Beasley                 DOB: 03-08-1946                    MRN: [id number]     HPI: Elizabeth Mills is a 72 y.o. female patient of Dr. Delton SeeNelson, referred by Ronie Spiesayna Dunn, NP that presents today for lipid evaluation.  PMH includes palpitations/PSVT per Dr. Yevonne PaxBrackbill's remote note, HLD, hypothyroidism, asthma, colitis, GERD, LVH, aortic valve sclerosis.   She presents alone for discussion of cholesterol. She states that she gets short of breath if walking distances. She states she also feels short of breath when she lays down. She has been sleeping on left side to help with breathing. She also states that if she sleeps on her back she needs 2 pillows. She denies cheat pain. She denies any swelling. She states this has been going for the past month around the time her asthma medication was changed. She states that she is improved from visit with Ronie Spiesayna Dunn, but that she can't seem to get all the way better.   Has been using albuterol a few times per day.   She states that she has colititis and taking the statins and zetia increased her symptoms. She states that she did about as well on the medications as she did off the medications. She thinks walking has helped. She states that she feels better when she does not take the fenofibrate at all.   Risk Factors: Age, ASCVD risk 10 year 12.6%  LDL Goal: <100 given elevated 10 year risk  Current Medications: fenofibrate 160mg  daily  Intolerances: Lipitor (GI symptoms), Crestor (GI symptoms), Pravachol (GI symptoms), Zetia (GI symptoms), Zocor (GI symptoms)  Diet: She eats out and from home. She eats seafood or chinese when eating out. She eats mostly fish. She does not eat a lot of red meat. She cooks with oil olive. She does eat vegetables regularly. She rarely fries foods. She drinks mostly water.   Exercise: She does walk some. She does get short of breath.   Family History: Arthritis in her father and mother; Cancer in her mother;  Hyperlipidemia in her father and mother. Grandfather passed of heart issues in 780s. Brother with heart issues and cholesterol. Her son had stroke with he was 72 yo.   Social History: denies tobacco and alcohol  Labs: 09/22/17:  TC 245, TG 72, HDL 100, LDL 131 (fenofibrate 160mg  every 3-4 days)  Past Medical History:  Diagnosis Date  . Allergic rhinitis, cause unspecified   . Asthma   . Back pain   . Colitis   . Diverticulitis   . Generalized headaches   . GERD (gastroesophageal reflux disease)   . Hypercholesterolemia   . Hypothyroidism   . LVH (left ventricular hypertrophy)   . PSVT (paroxysmal supraventricular tachycardia) (HCC)   . Statin intolerance     Current Outpatient Medications on File Prior to Visit  Medication Sig Dispense Refill  . aspirin 81 MG tablet Take 81 mg by mouth as needed (blood thinner).     . Cholecalciferol (VITAMIN D PO) Take 1 tablet by mouth daily.    Marland Kitchen. estradiol (ESTRACE) 0.1 MG/GM vaginal cream Place 1 Applicatorful vaginally once a week.    . fenofibrate 160 MG tablet Take 1 tablet (160 mg total) by mouth daily. 90 tablet 3  . fluticasone (FLONASE) 50 MCG/ACT nasal spray PLACE 2 SPRAYS INTO BOTH NOSTRILS DAILY AS  NEEDED FOR ALLERGIES OR RHINITIS (ALLERGIES). 16 g 1  . Fluticasone-Salmeterol (ADVAIR DISKUS) 250-50 MCG/DOSE AEPB USE 1 INHALATION EVERY 12 HOURS (NEED APPOINTMENT WITH PRIMARY CARE PHYSICIAN FOR FURTHER REFILLS) 180 each 3  . loratadine (CLARITIN) 10 MG tablet Take 10 mg by mouth daily as needed for allergies (allergies).    . metoprolol succinate (TOPROL-XL) 25 MG 24 hr tablet TAKE ONE-HALF (1/2) TABLET DAILY 45 tablet 2  . montelukast (SINGULAIR) 10 MG tablet Take 1 tablet (10 mg total) by mouth at bedtime. 30 tablet 3  . omeprazole (PRILOSEC) 20 MG capsule Take 1 capsule (20 mg total) by mouth daily. 30 capsule 11  . PROAIR HFA 108 (90 Base) MCG/ACT inhaler Inhale 1-2 puffs into the lungs every 6 (six) hours as needed for wheezing or  shortness of breath. 1 Inhaler 8  . SYNTHROID 75 MCG tablet Take 1 tablet (75 mcg total) by mouth daily before breakfast. 90 tablet 1   No current facility-administered medications on file prior to visit.     Allergies  Allergen Reactions  . Crestor [Rosuvastatin Calcium] Other (See Comments)    Gi issues  . Lipitor [Atorvastatin Calcium]     Gi symp  . Phenergan [Promethazine Hcl] Other (See Comments)    Father and son are highly allergic so patient doesn't take it  . Pravachol Other (See Comments)    Gi issues  . Promethazine     Other reaction(s): Other (See Comments) Father and son have AMS with Phenergan - patient afraid to take  . Promethazine Hcl   . Sulfa Antibiotics Other (See Comments)    dizziness  . Sulfamethoxazole     Other reaction(s): Dizziness (intolerance) Disoriented  . Zetia [Ezetimibe] Other (See Comments)    Gi issues  . Zocor [Simvastatin - High Dose] Other (See Comments)    Gi issues    Assessment/Plan: Hyperlipidemia: LDL not at more strict goal <100. Will start low dose rosuvastatin 5mg  twice weekly increasing to three times weekly if tolerated for LDL and TG benefit. Will stop fenofibrate due to lack of benefit and patient experiencing side effects. Will plan to recheck lipids after follow up with Ronie Spies, PA if pt tolerating statin well.   A lipid panel was drawn today. Will adjust plan if needed based on results.   Thank you,  Freddie Apley. Cleatis Polka, PharmD  [facility]  12/15/2017 7:52 AM  ADDENDUM:  LDL at target <100 for aggressive primary prevention. TG also at goal. Spoke with patient about results. She has made changes with her diet that are likely contributing to better control. We discussed continuing with plan above - discontinue fenofibrate due to side effects and lack of CV benefit and start rosuvastatin 5mg  twice weekly for continued LDL reduction and CV benefit. She will call with any concerns tolerating statin.  Will repeat cholesterol panel after visit with Ronie Spies, PA if tolerating well.

## 2017-12-15 NOTE — Patient Instructions (Signed)
STOP fenofibrate  START rosuvstatin (Crestor) 5mg  twice weekly. If you have any issues tolerating the rosuvastatin please call the clinic at [phone].   We will plan to recheck your cholesterol panel in 2-3 months.    Cholesterol Cholesterol is a fat. Your body needs a small amount of cholesterol. Cholesterol (plaque) may build up in your blood vessels (arteries). That makes you more likely to have a heart attack or stroke. You cannot feel your cholesterol level. Having a blood test is the only way to find out if your level is high. Keep your test results. Work with your doctor to keep your cholesterol at a good level. What do the results mean?  Total cholesterol is how much cholesterol is in your blood.  LDL is bad cholesterol. This is the type that can build up. Try to have low LDL.  HDL is good cholesterol. It cleans your blood vessels and carries LDL away. Try to have high HDL.  Triglycerides are fat that the body can store or burn for energy. What are good levels of cholesterol?  Total cholesterol below 200.  LDL below 100 is good for people who have health risks. LDL below 70 is good for people who have very high risks.  HDL above 40 is good. It is best to have HDL of 60 or higher.  Triglycerides below 150. How can I lower my cholesterol? Diet Follow your diet program as told by your doctor.  Choose fish, white meat chicken, or [place] that is roasted or baked. Try not to eat red meat, fried foods, sausage, or lunch meats.  Eat lots of fresh fruits and vegetables.  Choose whole grains, beans, pasta, potatoes, and cereals.  Choose olive oil, corn oil, or canola oil. Only use small amounts.  Try not to eat butter, mayonnaise, shortening, or palm kernel oils.  Try not to eat foods with trans fats.  Choose low-fat or nonfat dairy foods. ? Drink skim or nonfat milk. ? Eat low-fat or nonfat yogurt and cheeses. ? Try not to drink whole milk or cream. ? Try not to eat  ice cream, egg yolks, or full-fat cheeses.  Healthy desserts include angel food cake, ginger snaps, animal crackers, hard candy, popsicles, and low-fat or nonfat frozen yogurt. Try not to eat pastries, cakes, pies, and cookies.  Exercise Follow your exercise program as told by your doctor.  Be more active. Try gardening, walking, and taking the stairs.  Ask your doctor about ways that you can be more active.  Medicine  Take over-the-counter and prescription medicines only as told by your doctor. This information is not intended to replace advice given to you by your health care provider. Make sure you discuss any questions you have with your health care provider. Document Released: 06/06/2008 Document Revised: 10/10/2015 Document Reviewed: 09/20/2015 Elsevier Interactive Patient Education  [employer]2018 Else[employer] Inc.

## 2017-12-16 ENCOUNTER — Telehealth: Payer: Self-pay | Admitting: Physician Assistant

## 2017-12-16 NOTE — Telephone Encounter (Signed)
Reviewed pharmD office note on lipids. Sounds like her SOB has improved somewhat but still persistent. Since echo was unrevealing, can we set her up for an exercise nuclear stress test? She should bring her inhaler to that test. Unless previously advised not to for other reasons, let's have her start a baby aspirin (enteric coated) as well.  I would also suggest going ahead and getting a 2V CXR to exclude any obvious non-cardiac abnormalities. Dayna Dunn PA-C

## 2017-12-16 NOTE — Telephone Encounter (Signed)
Spoke with pt.  Pt states that her SOB is still there, but it is a lot better and that she is trying to walk more.  She states that she thinks it could be coming form humidity.  Advised to pt that we wanted to set her up for a Nuc Exercise Stress Test and 2 View CXR.  Pt would like to wait until her f/u appt to address again, she states that she is better. I advised pt I would let Ronie Spies, PA-C know and if that was ok, we would just see her at her f/u appt. Pt thanked me for the call.

## 2017-12-18 ENCOUNTER — Telehealth: Payer: Self-pay | Admitting: Internal Medicine

## 2017-12-18 NOTE — Telephone Encounter (Signed)
Copied from CRM [id number]. Topic: Quick Communication - Rx Refill/Question >> Dec 18, 2017  4:28 PM Baldo Daub L wrote: Medication: SYNTHROID 75 MCG tablet  Amy Shawnie Pons with [facility] calling.  States there is a coverage issue with this medication and they need to speak with someone who is clinical. Amy can be reached at [phone] ref#: [id number] M-F 9-5:30EST

## 2017-12-21 NOTE — Telephone Encounter (Signed)
Is it ok to sent the generic synthroid (levothyroxine)?

## 2017-12-21 NOTE — Telephone Encounter (Signed)
Copied from CRM [id number]. Topic: Quick Communication - Rx Refill/Question >> Dec 18, 2017  4:28 PM Baldo Daub L wrote: Medication: SYNTHROID 75 MCG tablet  Amy Shawnie Pons with [facility] calling.  States there is a coverage issue with this medication and they need to speak with someone who is clinical. Amy can be reached at [phone] ref#: [id number] M-F 9-5:30EST >> Dec 21, 2017  1:13 PM Floria Raveling A wrote: Rob with  [facility] called stated that pt ins does not cover the synthroid, they only cover the Liothyronine.    Call back number [phone]- EXB#[phone]

## 2017-12-21 NOTE — Telephone Encounter (Signed)
Josie with [facility] states that pt had PA previously for Synthroid. She notes that last PA states the pt had tried levothyroxine before. She is faxing over clinical documentation to be completed for new PA.

## 2017-12-21 NOTE — Telephone Encounter (Signed)
Just contact patient and ask why she wants to take the trade synthroid - ? Related to side effects from generic or not feeling it was working well / inconsistency of generics.

## 2017-12-22 ENCOUNTER — Other Ambulatory Visit: Payer: Medicare Other

## 2017-12-22 NOTE — Telephone Encounter (Signed)
Spoke with pt and she advised she can not take levothyroxine due to it making her thyroid labs fluctuate. Sending form to [facility] by fax explaining this.

## 2018-01-18 NOTE — Progress Notes (Signed)
Cardiology Office Note    Date:  01/19/2018  ID:  Elizabeth Mills, DOB Sep 24, 1945, MRN [id number] PCP:  Pincus Sanes, MD  Cardiologist:  Tobias Alexander, MD   Chief Complaint: f/u SOB  History of Present Illness:  Elizabeth Mills is a 72 y.o. female with history of palpitations/PSVT per Dr. Yevonne Pax remote note, HLD (intolerant of statins), hypothyroidism, asthma, colitis, GERD, LVH, aortic valve sclerosis presents for f/u of SOB. Remote echo 2008 showed mild-mod LVH, EF normal, + impared relaxation, mild aortic valve sclerosis without stenosis. I recently saw her for SOB. About 6 weeks prior she had had had a change in her ProAir to generic and she noticed an uptick in the SOB/wheezing that was consistent with prior asthma. 1 month prior she had also had increased stress/responsibilities with grandchildren so stopped walking regularly. Around that time she was able to change back to ProAir and had some improvement, but also noticed continued dyspnea on exertion beyond her norm. She would notice it with walking or standing. She would notice her heart rate go up while standing. The humidity sometimes would make it worse and expanding her bra would help. She denied any significant chest pain, orthopnea, PND, LEE, weight gain, cough, fevers, chills, known blood loss. 2D echo 12/10/17 showed EF 60-65%, normal diastolic parameters, mild TR, PASP upper limits of normal. I discussed CXR and nuc with the patient but she declined indicating her breathing had improved on its own. She also saw pharmD given HLD - fenofibrate was stopped due to lack of known clinical benefit and Crestor 5mg  twice weekly was started. Last labs 12/02/17 showed Hgb 13.2, normal BMET Cr 0.86, normal pro BNP, LDL 75 (previously 131).  She returns for follow-up overall feeling much better without specific intervention. She now believes the recently switched inhaler she'd received was "bad," perhaps plugged up or not delivering full  dose of medication. When she got a refill, symptoms greatly improved. She has mild DOE with higher level of activities which is unchanged from prior and back to baseline. She is back to walking regularly and also occasionally rides her bike as well. No CP or palpitations. Tolerating Crestor 2x/week - when she went to 3x a week she had increased GI upset.   Past Medical History:  Diagnosis Date  . Allergic rhinitis, cause unspecified   . Asthma   . Back pain   . Colitis   . Diverticulitis   . Generalized headaches   . GERD (gastroesophageal reflux disease)   . Hypercholesterolemia   . Hypothyroidism   . LVH (left ventricular hypertrophy)   . PSVT (paroxysmal supraventricular tachycardia) (HCC)   . Statin intolerance     Past Surgical History:  Procedure Laterality Date  . ABDOMINAL HYSTERECTOMY  1983  . APPENDECTOMY  1955  . BACK SURGERY    . BREAST BIOPSY  1982  . BREAST EXCISIONAL BIOPSY Bilateral   . COLONOSCOPY    . EYE SURGERY  11/14/2009, 1990   blocked tear duct repair  . TONSILLECTOMY  1953    Current Medications: Current Meds  Medication Sig  . Cholecalciferol (VITAMIN D PO) Take 1 tablet by mouth daily.  Marland Kitchen estradiol (ESTRACE) 0.1 MG/GM vaginal cream Place 1 Applicatorful vaginally once a week.  . fluticasone (FLONASE) 50 MCG/ACT nasal spray PLACE 2 SPRAYS INTO BOTH NOSTRILS DAILY AS NEEDED FOR ALLERGIES OR RHINITIS (ALLERGIES).  . Fluticasone-Salmeterol (ADVAIR DISKUS) 250-50 MCG/DOSE AEPB USE 1 INHALATION EVERY 12 HOURS (NEED APPOINTMENT WITH PRIMARY  CARE PHYSICIAN FOR FURTHER REFILLS)  . loratadine (CLARITIN) 10 MG tablet Take 10 mg by mouth daily as needed for allergies (allergies).  . metoprolol succinate (TOPROL-XL) 25 MG 24 hr tablet TAKE ONE-HALF (1/2) TABLET DAILY  . omeprazole (PRILOSEC) 20 MG capsule Take 1 capsule (20 mg total) by mouth daily.  Marland Kitchen PROAIR HFA 108 (90 Base) MCG/ACT inhaler Inhale 1-2 puffs into the lungs every 6 (six) hours as needed for  wheezing or shortness of breath.  . rosuvastatin (CRESTOR) 5 MG tablet Take 1 tablet twice weekly and increase as tolerated  . SYNTHROID 75 MCG tablet Take 1 tablet (75 mcg total) by mouth daily before breakfast.      Allergies:   Crestor [rosuvastatin calcium]; Lipitor [atorvastatin calcium]; Phenergan [promethazine hcl]; Pravachol; Promethazine; Promethazine hcl; Sulfa antibiotics; Sulfamethoxazole; Zetia [ezetimibe]; and Zocor [simvastatin - high dose]   Social History   Socioeconomic History  . Marital status: Married    Spouse name: Not on file  . Number of children: 2  . Years of education: 84  . Highest education level: Not on file  Occupational History  . Occupation: Futures trader  Social Needs  . Financial resource strain: Not hard at all  . Food insecurity:    Worry: Never true    Inability: Never true  . Transportation needs:    Medical: No    Non-medical: No  Tobacco Use  . Smoking status: Never Smoker  . Smokeless tobacco: Never Used  . Tobacco comment: married, lives with spouse - retired  Substance and Sexual Activity  . Alcohol use: No  . Drug use: No  . Sexual activity: Not Currently  Lifestyle  . Physical activity:    Days per week: 4 days    Minutes per session: 60 min  . Stress: To some extent  Relationships  . Social connections:    Talks on phone: More than three times a week    Gets together: More than three times a week    Attends religious service: 1 to 4 times per year    Active member of club or organization: Yes    Attends meetings of clubs or organizations: 1 to 4 times per year    Relationship status: Married  Other Topics Concern  . Not on file  Social History Narrative   Regular exercise-yes, walk 2-3 times a week   Caffeine Use-no     Family History:  The patient's family history includes Arthritis in her father and mother; Cancer in her mother; Hyperlipidemia in her father and mother. There is no history of Breast cancer.  ROS:     Please see the history of present illness.   All other systems are reviewed and otherwise negative.    PHYSICAL EXAM:   VS:  BP 112/62   Pulse 72   Ht 5\' 3"  (1.6 m)   Wt 134 lb (60.8 kg)   SpO2 96%   BMI 23.74 kg/m   BMI: Body mass index is 23.74 kg/m. GEN: Well nourished, well developed WF, in no acute distress HEENT: normocephalic, atraumatic Neck: no JVD, carotid bruits, or masses Cardiac: RRR; no murmurs, rubs, or gallops, no edema  Respiratory:  clear to auscultation bilaterally, normal work of breathing GI: soft, nontender, nondistended, + BS MS: no deformity or atrophy Skin: warm and dry, no rash Neuro:  Alert and Oriented x 3, Strength and sensation are intact, follows commands Psych: euthymic mood, full affect  Wt Readings from Last 3 Encounters:  01/19/18 134  lb (60.8 kg)  12/02/17 129 lb 12.8 oz (58.9 kg)  11/17/17 131 lb 12.8 oz (59.8 kg)      Studies/Labs Reviewed:   EKG:  EKG was not ordered today.  Recent Labs: 10/28/2017: ALT 27; TSH 0.90 12/02/2017: BUN 19; Creatinine, Ser 0.86; Hemoglobin 13.2; NT-Pro BNP 105; Platelets 374; Potassium 4.5; Sodium 141   Lipid Panel    Component Value Date/Time   CHOL 194 12/15/2017 0913   TRIG 52 12/15/2017 0913   HDL 109 12/15/2017 0913   CHOLHDL 1.8 12/15/2017 0913   CHOLHDL 2.5 04/26/2015 0939   VLDL 12 04/26/2015 0939   LDLCALC 75 12/15/2017 0913   LDLDIRECT 94.2 07/28/2012 1010    Additional studies/ records that were reviewed today include: Summarized above.    ASSESSMENT & PLAN:   1. Shortness of breath - resolved without specific intervention other than refilling to new inhaler refill. She feels back to baseline. Continue to monitor. 2. Palpitations - quiescent on BB. Continue. 3. PSVT - no recent breakthrough sx. Continue BB. 4. Hyperlipidemia - continue Crestor twice weekly. She ate today. Will have her return per her request next week fasting for LFTs/lipids.  Disposition: F/u with Dr. Delton See  in 1 year, sooner if symptoms recur    Medication Adjustments/Labs and Tests Ordered: Current medicines are reviewed at length with the patient today.  Concerns regarding medicines are outlined above. Medication changes, Labs and Tests ordered today are summarized above and listed in the Patient Instructions accessible in Encounters.   Signed, Laurann Montana, PA-C  01/19/2018 10:26 AM    [facility] [address] [place], [place], [place]  [address] Phone: [phone]; Fax: [phone]

## 2018-01-19 ENCOUNTER — Encounter: Payer: Self-pay | Admitting: Physician Assistant

## 2018-01-19 ENCOUNTER — Ambulatory Visit (INDEPENDENT_AMBULATORY_CARE_PROVIDER_SITE_OTHER): Payer: Medicare Other | Admitting: Physician Assistant

## 2018-01-19 VITALS — BP 112/62 | HR 72 | Ht 63.0 in | Wt 134.0 lb

## 2018-01-19 DIAGNOSIS — E785 Hyperlipidemia, unspecified: Secondary | ICD-10-CM | POA: Diagnosis not present

## 2018-01-19 DIAGNOSIS — I471 Supraventricular tachycardia: Secondary | ICD-10-CM

## 2018-01-19 DIAGNOSIS — R0602 Shortness of breath: Secondary | ICD-10-CM

## 2018-01-19 DIAGNOSIS — Z87898 Personal history of other specified conditions: Secondary | ICD-10-CM | POA: Diagnosis not present

## 2018-01-19 MED ORDER — METOPROLOL SUCCINATE ER 25 MG PO TB24: 12.5000 mg | ORAL_TABLET | Freq: Every day | ORAL | 3 refills | Status: DC

## 2018-01-19 NOTE — Patient Instructions (Signed)
Medication Instructions:  Your physician recommends that you continue on your current medications as directed. Please refer to the Current Medication list given to you today.  If you need a refill on your cardiac medications before your next appointment, please call your pharmacy.   Lab work: 01/25/18:  FASTING LIPID & LFT  If you have labs (blood work) drawn today and your tests are completely normal, you will receive your results only by: Marland Kitchen MyChart Message (if you have MyChart) OR . A paper copy in the mail If you have any lab test that is abnormal or we need to change your treatment, we will call you to review the results.  Testing/Procedures: None ordered  Follow-Up: At [facility], you and your health needs are our priority.  As part of our continuing mission to provide you with exceptional heart care, we have created designated Provider Care Teams.  These Care Teams include your primary Cardiologist (physician) and Advanced Practice Providers (APPs -  Physician Assistants and Nurse Practitioners) who all work together to provide you with the care you need, when you need it. You will need a follow up appointment in 1 years.  Please call our office 2 months in advance to schedule this appointment.  You may see Tobias Alexander, MD or one of the following Advanced Practice Providers on your designated Care Team:   [place], PA-C Ronie Spies, PA-C . Jacolyn Reedy, PA-C  Any Other Special Instructions Will Be Listed Below (If Applicable).

## 2018-01-25 ENCOUNTER — Other Ambulatory Visit: Payer: Medicare Other | Admitting: *Deleted

## 2018-01-25 DIAGNOSIS — E785 Hyperlipidemia, unspecified: Secondary | ICD-10-CM

## 2018-01-25 LAB — LIPID PANEL
CHOLESTEROL TOTAL: 201 mg/dL — AB (ref 100–199)
Chol/HDL Ratio: 2.4 ratio (ref 0.0–4.4)
HDL: 85 mg/dL (ref >39)
LDL Calculated: 103 mg/dL — ABNORMAL HIGH (ref 0–99)
TRIGLYCERIDES: 67 mg/dL (ref 0–149)
VLDL Cholesterol Cal: 13 mg/dL (ref 5–40)

## 2018-01-25 LAB — HEPATIC FUNCTION PANEL
ALK PHOS: 43 IU/L (ref 39–117)
ALT: 12 IU/L (ref 0–32)
AST: 20 IU/L (ref 0–40)
Albumin: 4 g/dL (ref 3.5–4.8)
BILIRUBIN TOTAL: 0.6 mg/dL (ref 0.0–1.2)
Bilirubin, Direct: 0.15 mg/dL (ref 0.00–0.40)
Total Protein: 6.8 g/dL (ref 6.0–8.5)

## 2018-01-27 ENCOUNTER — Telehealth: Payer: Self-pay | Admitting: *Deleted

## 2018-01-27 DIAGNOSIS — E7800 Pure hypercholesterolemia, unspecified: Secondary | ICD-10-CM

## 2018-01-27 NOTE — Telephone Encounter (Signed)
-----   Message from Laurann Montana, [place] sent at 01/27/2018 10:11 AM EST ----- Please call patient. LDL is not as good as it was in the past but still near goal of <100 as outlined by pharmD. I would continue current therapy, try to improve diet with decreased saturated fats and starchy carbs, increase fiber intake, and we can continue to follow with repeat LFTs and lipids in 3 months. I will also cc to [facility] who saw patient in lipid clinic for [place]. Dayna Dunn PA-C

## 2018-02-10 ENCOUNTER — Other Ambulatory Visit: Payer: Self-pay | Admitting: Cardiology

## 2018-02-10 ENCOUNTER — Telehealth: Payer: Self-pay

## 2018-02-10 MED ORDER — ROSUVASTATIN CALCIUM 5 MG PO TABS: ORAL_TABLET | ORAL | 3 refills | Status: DC

## 2018-02-10 NOTE — Telephone Encounter (Signed)
PA initiated for synthroid.   KEY: [id number]

## 2018-02-10 NOTE — Telephone Encounter (Signed)
Copied from CRM [id number]. Topic: General - Other >> Feb 09, 2018  1:54 PM Percival SpanishKennedy, Cheryl W wrote:  Pt call to say that a PA is required for her to get the below med.She said she spoke with [facility] and they told her a PA is required    SYNTHROID 75 MCG tablet

## 2018-02-15 ENCOUNTER — Other Ambulatory Visit: Payer: Self-pay

## 2018-02-15 MED ORDER — SYNTHROID 75 MCG PO TABS: 75.0000 ug | ORAL_TABLET | Freq: Every day | ORAL | 1 refills | Status: DC

## 2018-02-15 NOTE — Telephone Encounter (Signed)
PA for synthroid has been approved pharmacy and patient is aware.

## 2018-04-29 ENCOUNTER — Other Ambulatory Visit: Payer: Medicare Other

## 2018-04-29 ENCOUNTER — Other Ambulatory Visit: Payer: Self-pay | Admitting: Internal Medicine

## 2018-04-29 DIAGNOSIS — Z1231 Encounter for screening mammogram for malignant neoplasm of breast: Secondary | ICD-10-CM

## 2018-04-29 DIAGNOSIS — E7800 Pure hypercholesterolemia, unspecified: Secondary | ICD-10-CM

## 2018-04-29 LAB — LIPID PANEL
Chol/HDL Ratio: 2.5 ratio (ref 0.0–4.4)
Cholesterol, Total: 219 mg/dL — ABNORMAL HIGH (ref 100–199)
HDL: 87 mg/dL (ref >39)
LDL Calculated: 118 mg/dL — ABNORMAL HIGH (ref 0–99)
Triglycerides: 71 mg/dL (ref 0–149)
VLDL Cholesterol Cal: 14 mg/dL (ref 5–40)

## 2018-04-29 LAB — HEPATIC FUNCTION PANEL
ALT: 14 IU/L (ref 0–32)
AST: 20 IU/L (ref 0–40)
Albumin: 4 g/dL (ref 3.7–4.7)
Alkaline Phosphatase: 44 IU/L (ref 39–117)
BILIRUBIN TOTAL: 0.6 mg/dL (ref 0.0–1.2)
Bilirubin, Direct: 0.16 mg/dL (ref 0.00–0.40)
Total Protein: 6.5 g/dL (ref 6.0–8.5)

## 2018-04-30 ENCOUNTER — Telehealth: Payer: Self-pay | Admitting: *Deleted

## 2018-04-30 DIAGNOSIS — E7800 Pure hypercholesterolemia, unspecified: Secondary | ICD-10-CM

## 2018-04-30 DIAGNOSIS — Z79899 Other long term (current) drug therapy: Secondary | ICD-10-CM

## 2018-04-30 MED ORDER — ROSUVASTATIN CALCIUM 5 MG PO TABS: ORAL_TABLET | ORAL | 3 refills | Status: DC

## 2018-04-30 NOTE — Telephone Encounter (Signed)
-----   Message from Dayna N Dunn, PA-C sent at 04/30/2018  7:23 AM EST ----- Please let patient know LDL and total cholesterol have been creeping up further. Please find out what's changed - going in wrong direction. If tolerating current statin (Crestor) would advise to increase to one additional dose per week and recheck these both in 3 months  Dayna Dunn PA-C  

## 2018-04-30 NOTE — Telephone Encounter (Signed)
Called pt re: lab results and left a message for pt to call back. 

## 2018-04-30 NOTE — Telephone Encounter (Signed)
Pt returned my call and she has been made aware of her lab results and verbalized understanding. Lab orders have been placed in epic.

## 2018-04-30 NOTE — Telephone Encounter (Signed)
-----   Message from Laurann Montana, PA-C sent at 04/30/2018  7:23 AM EST ----- Please let patient know LDL and total cholesterol have been creeping up further. Please find out what's changed - going in wrong direction. If tolerating current statin (Crestor) would advise to increase to one additional dose per week and recheck these both in 3 months  Dayna Dunn PA-C

## 2018-06-02 ENCOUNTER — Ambulatory Visit
Admission: RE | Admit: 2018-06-02 | Discharge: 2018-06-02 | Disposition: A | Discharge status: Home / Self Care | Payer: Medicare Other | Source: Ambulatory Visit | Attending: Internal Medicine | Admitting: Internal Medicine

## 2018-06-02 ENCOUNTER — Other Ambulatory Visit: Payer: Self-pay

## 2018-06-02 DIAGNOSIS — Z1231 Encounter for screening mammogram for malignant neoplasm of breast: Secondary | ICD-10-CM

## 2018-06-03 ENCOUNTER — Other Ambulatory Visit: Payer: Self-pay | Admitting: Internal Medicine

## 2018-06-03 DIAGNOSIS — R928 Other abnormal and inconclusive findings on diagnostic imaging of breast: Secondary | ICD-10-CM

## 2018-06-08 ENCOUNTER — Ambulatory Visit: Admission: RE | Admit: 2018-06-08 | Payer: Medicare Other | Source: Ambulatory Visit

## 2018-06-08 ENCOUNTER — Other Ambulatory Visit: Payer: Self-pay

## 2018-06-08 ENCOUNTER — Ambulatory Visit
Admission: RE | Admit: 2018-06-08 | Discharge: 2018-06-08 | Disposition: A | Discharge status: Home / Self Care | Payer: Medicare Other | Source: Ambulatory Visit | Attending: Internal Medicine | Admitting: Internal Medicine

## 2018-06-08 DIAGNOSIS — R928 Other abnormal and inconclusive findings on diagnostic imaging of breast: Secondary | ICD-10-CM

## 2018-07-26 ENCOUNTER — Other Ambulatory Visit: Payer: Medicare Other

## 2018-08-14 ENCOUNTER — Other Ambulatory Visit: Payer: Self-pay | Admitting: Internal Medicine

## 2018-09-02 ENCOUNTER — Ambulatory Visit: Payer: Medicare Other

## 2018-09-06 ENCOUNTER — Other Ambulatory Visit: Payer: Self-pay | Admitting: *Deleted

## 2018-09-06 DIAGNOSIS — R635 Abnormal weight gain: Secondary | ICD-10-CM

## 2018-09-06 NOTE — Telephone Encounter (Signed)
Can you please add TSH to labwork under dx weight gain and let pt know? Thanks [employer] PA-C

## 2018-09-07 ENCOUNTER — Telehealth: Payer: Self-pay

## 2018-09-07 NOTE — Telephone Encounter (Signed)
    COVID-19 Pre-Screening Questions:  . In the past 7 to 10 days have you had a cough,  shortness of breath, headache, congestion, fever (100 or greater) body aches, chills, sore throat, or sudden loss of taste or sense of smell? . Have you been around anyone with known Covid 19. . Have you been around anyone who is awaiting Covid 19 test results in the past 7 to 10 days? . Have you been around anyone who has been exposed to Covid 19, or has mentioned symptoms of Covid 19 within the past 7 to 10 days?  If you have any concerns/questions about symptoms patients report during screening (either on the phone or at threshold). Contact the provider seeing the patient or DOD for further guidance.  If neither are available contact a member of the leadership team.          Pt answered No to all questions. [id number] 09/07/2018

## 2018-09-08 ENCOUNTER — Other Ambulatory Visit: Payer: Self-pay

## 2018-09-08 ENCOUNTER — Other Ambulatory Visit: Payer: Medicare Other | Admitting: *Deleted

## 2018-09-08 DIAGNOSIS — Z79899 Other long term (current) drug therapy: Secondary | ICD-10-CM

## 2018-09-08 DIAGNOSIS — R635 Abnormal weight gain: Secondary | ICD-10-CM

## 2018-09-08 DIAGNOSIS — E7800 Pure hypercholesterolemia, unspecified: Secondary | ICD-10-CM

## 2018-09-08 LAB — LIPID PANEL
Chol/HDL Ratio: 2.5 ratio (ref 0.0–4.4)
Cholesterol, Total: 205 mg/dL — ABNORMAL HIGH (ref 100–199)
HDL: 82 mg/dL (ref >39)
LDL Calculated: 99 mg/dL (ref 0–99)
Triglycerides: 118 mg/dL (ref 0–149)
VLDL Cholesterol Cal: 24 mg/dL (ref 5–40)

## 2018-09-08 LAB — HEPATIC FUNCTION PANEL
ALT: 18 IU/L (ref 0–32)
AST: 21 IU/L (ref 0–40)
Albumin: 3.9 g/dL (ref 3.7–4.7)
Alkaline Phosphatase: 44 IU/L (ref 39–117)
Bilirubin Total: 0.5 mg/dL (ref 0.0–1.2)
Bilirubin, Direct: 0.12 mg/dL (ref 0.00–0.40)
Total Protein: 6.5 g/dL (ref 6.0–8.5)

## 2018-09-08 LAB — TSH: TSH: 0.585 u[IU]/mL (ref 0.450–4.500)

## 2018-11-12 ENCOUNTER — Other Ambulatory Visit: Payer: Self-pay | Admitting: Physician Assistant

## 2018-11-12 ENCOUNTER — Other Ambulatory Visit: Payer: Self-pay | Admitting: Internal Medicine

## 2019-01-23 NOTE — Progress Notes (Addendum)
Cardiology Office Note    Date:  01/25/2019   ID:  Elizabeth Mills, DOB 1945/04/06, MRN [id number]  PCP:  Binnie Rail, MD  Cardiologist:  Ena Dawley, MD  Electrophysiologist:  None   Chief Complaint: f/u PSVT, shortness of breath  History of Present Illness:   Elizabeth Mills is a 73 y.o. female with history of palpitations/PSVT per Dr. Sherryl Barters remote note, HLD (intolerant of statins), hypothyroidism, asthma, colitis, GERD, prior LVH, aortic valve sclerosis presents for routine cardiology follow-up. Remote echo 2008 showed mild-mod LVH, EF normal, + impared relaxation, mild aortic valve sclerosis without stenosis.   I met her in 2019 for evaluation of shortness of breath. About 6 weeks prior she had had had a change in her ProAir to generic and she noticed an uptick in the SOB/wheezing that was consistent with prior asthma. 1 month prior she had also had increased stress/responsibilities with grandchildren so stopped walking regularly. Around that time she was able to change back to ProAir and had some improvement, but also noticed continued dyspnea on exertion beyond her norm. She would notice it with walking or standing. She would notice her heart rate go up while standing. The humidity would make it worse and expanding her bra would help. She denied any significant chest pain, orthopnea, PND, LEE, weight gain, cough, fevers, chills, or known blood loss. 2D echo 12/10/17 showed EF 40-97%, normal diastolic parameters, mild TR, PASP upper limits of normal. I discussed CXR and stress test with the patient but she declined indicating her breathing had improved on its own. She also saw pharmD given HLD. Fenofibrate was stopped due to lack of known clinical benefit and Crestor was started. Last labs 08/2018 showed LDL 99, Tchol 205, normal trig, normal TSH, normal LFTs; 11/2017 CBC showed Hgb 13.2, normal pro BNP.  She returns for follow-up today overall doing well without acute complaint.  No further dyspnea on exertion. She rides her bike regularly. No chest pain. She did notice about 2-3 months ago that she had episodic palpitations almost as if the metoprolol was not doing anything, so had to take an additional half tablet for a few days around that time. This has resolved and not recurred.   Past Medical History:  Diagnosis Date  . Allergic rhinitis, cause unspecified   . Asthma   . Back pain   . Colitis   . Diverticulitis   . Generalized headaches   . GERD (gastroesophageal reflux disease)   . Hypercholesterolemia   . Hypothyroidism   . LVH (left ventricular hypertrophy)   . PSVT (paroxysmal supraventricular tachycardia) ([place])   . Statin intolerance     Past Surgical History:  Procedure Laterality Date  . ABDOMINAL HYSTERECTOMY  1983  . APPENDECTOMY  1955  . BACK SURGERY    . BREAST BIOPSY  1982  . BREAST EXCISIONAL BIOPSY Bilateral   . COLONOSCOPY    . EYE SURGERY  11/14/2009, 1990   blocked tear duct repair  . TONSILLECTOMY  1953    Current Medications: Current Meds  Medication Sig  . Cholecalciferol (VITAMIN D PO) Take 1 tablet by mouth daily.  Marland Kitchen estradiol (ESTRACE) 0.1 MG/GM vaginal cream Place 1 Applicatorful vaginally once a week.  . fluticasone (FLONASE) 50 MCG/ACT nasal spray PLACE 2 SPRAYS INTO BOTH NOSTRILS DAILY AS NEEDED FOR ALLERGIES OR RHINITIS (ALLERGIES).  . Fluticasone-Salmeterol (ADVAIR DISKUS) 250-50 MCG/DOSE AEPB USE 1 INHALATION EVERY 12 HOURS (NEED APPOINTMENT WITH PRIMARY CARE PHYSICIAN FOR FURTHER REFILLS)  .  loratadine (CLARITIN) 10 MG tablet Take 10 mg by mouth daily as needed for allergies (allergies).  . metoprolol succinate (TOPROL-XL) 25 MG 24 hr tablet Take 0.5 tablets (12.5 mg total) by mouth daily. Please make annual appt for future refills. [phone].  Marland Kitchen PROAIR HFA 108 (90 Base) MCG/ACT inhaler Inhale 1-2 puffs into the lungs every 6 (six) hours as needed for wheezing or shortness of breath.  . rosuvastatin (CRESTOR) 5  MG tablet Take 1 tablet by mouth 4 times a week , increase as tolerated  . SYNTHROID 75 MCG tablet TAKE 1 TABLET DAILY BEFORE BREAKFAST  + Immodium OTC TID and cholestyramine 4g daily at the direction of her GI team  Allergies:   Crestor [rosuvastatin calcium], Lipitor [atorvastatin calcium], Phenergan [promethazine hcl], Pravachol, Promethazine, Promethazine hcl, Sulfa antibiotics, Sulfamethoxazole, Zetia [ezetimibe], and Zocor [simvastatin - high dose]   Social History   Socioeconomic History  . Marital status: Married    Spouse name: Not on file  . Number of children: 2  . Years of education: 4  . Highest education level: Not on file  Occupational History  . Occupation: Agricultural engineer  Social Needs  . Financial resource strain: Not hard at all  . Food insecurity    Worry: Never true    Inability: Never true  . Transportation needs    Medical: No    Non-medical: No  Tobacco Use  . Smoking status: Never Smoker  . Smokeless tobacco: Never Used  . Tobacco comment: married, lives with spouse - retired  Substance and Sexual Activity  . Alcohol use: No  . Drug use: No  . Sexual activity: Not Currently  Lifestyle  . Physical activity    Days per week: 4 days    Minutes per session: 60 min  . Stress: To some extent  Relationships  . Social connections    Talks on phone: More than three times a week    Gets together: More than three times a week    Attends religious service: 1 to 4 times per year    Active member of club or organization: Yes    Attends meetings of clubs or organizations: 1 to 4 times per year    Relationship status: Married  Other Topics Concern  . Not on file  Social History Narrative   Regular exercise-yes, walk 2-3 times a week   Caffeine Use-no     Family History:  The patient's family history includes Arthritis in her father and mother; Cancer in her mother; Hyperlipidemia in her father and mother. There is no history of Breast cancer.  ROS:   Please  see the history of present illness.   All other systems are reviewed and otherwise negative.    EKGs/Labs/Other Studies Reviewed:    Studies reviewed were summarized above.   EKG:  EKG is ordered today, personally reviewed, demonstrating NSR 71bpm, low voltage QRS but otherwise no acute changes  Recent Labs: 09/08/2018: ALT 18; TSH 0.585  Recent Lipid Panel    Component Value Date/Time   CHOL 205 (H) 09/08/2018 0916   TRIG 118 09/08/2018 0916   HDL 82 09/08/2018 0916   CHOLHDL 2.5 09/08/2018 0916   CHOLHDL 2.5 04/26/2015 0939   VLDL 12 04/26/2015 0939   LDLCALC 99 09/08/2018 0916   LDLDIRECT 94.2 07/28/2012 1010    PHYSICAL EXAM:    VS:  BP 102/60   Pulse 71   Ht 5' 3"  (1.6 m)   Wt 132 lb 6.4 oz (60.1 kg)  SpO2 99%   BMI 23.45 kg/m   BMI: Body mass index is 23.45 kg/m.  GEN: Well nourished, well developed WF, in no acute distress HEENT: normocephalic, atraumatic Neck: no JVD, carotid bruits, or masses Cardiac: RRR; no murmurs, rubs, or gallops, no edema  Respiratory:  clear to auscultation bilaterally, normal work of breathing GI: soft, nontender, nondistended, + BS MS: no deformity or atrophy Skin: warm and dry, no rash Neuro:  Alert and Oriented x 3, Strength and sensation are intact, follows commands Psych: euthymic mood, full affect  Wt Readings from Last 3 Encounters:  01/25/19 132 lb 6.4 oz (60.1 kg)  01/19/18 134 lb (60.8 kg)  12/02/17 129 lb 12.8 oz (58.9 kg)     ASSESSMENT & PLAN:   1. Palpitations/PSVT - brief uptick in palpitations earlier this year but this has since resolved. It was a vague sensation almost as if her heart was "going to start" acting up but the metoprolol kept it from doing so. Continue current therapy. BP is low normal but she feels well with this. Will update her rx to account for the fact that she can take an additional 1/2 tablet daily as needed for uptick in palpitations. I did tell her though if she notices a recurrence to let  us know at which time I'd like to repeat an event monitor. She is maintaining NSR today. She is due for updated CBC so will obtain today since we are checking labs. 2. Hyperlipidemia - continue current regimen. She came fasting to the appointment to have this repeated. Check CMET/lipids. 3. Hypothyroidism - recent TSH 08/2018 was normal.  Disposition: F/u with Dr. Meda Coffee in 1 year.  Medication Adjustments/Labs and Tests Ordered: Current medicines are reviewed at length with the patient today.  Concerns regarding medicines are outlined above. Medication changes, Labs and Tests ordered today are summarized above and listed in the Patient Instructions accessible in Encounters.   Signed, Charlie Pitter, PA-C  01/25/2019 10:28 AM    [place], [place], [place]  [address] Phone: [phone]; Fax: [phone]

## 2019-01-25 ENCOUNTER — Encounter: Payer: Self-pay | Admitting: Physician Assistant

## 2019-01-25 ENCOUNTER — Other Ambulatory Visit: Payer: Self-pay

## 2019-01-25 ENCOUNTER — Ambulatory Visit (INDEPENDENT_AMBULATORY_CARE_PROVIDER_SITE_OTHER): Payer: Medicare Other | Admitting: Physician Assistant

## 2019-01-25 VITALS — BP 102/60 | HR 71 | Ht 63.0 in | Wt 132.4 lb

## 2019-01-25 DIAGNOSIS — R002 Palpitations: Secondary | ICD-10-CM

## 2019-01-25 DIAGNOSIS — E039 Hypothyroidism, unspecified: Secondary | ICD-10-CM | POA: Diagnosis not present

## 2019-01-25 DIAGNOSIS — E785 Hyperlipidemia, unspecified: Secondary | ICD-10-CM

## 2019-01-25 DIAGNOSIS — I471 Supraventricular tachycardia: Secondary | ICD-10-CM

## 2019-01-25 LAB — CBC
Hematocrit: 40.6 % (ref 34.0–46.6)
Hemoglobin: 14.2 g/dL (ref 11.1–15.9)
MCH: 31.6 pg (ref 26.6–33.0)
MCHC: 35 g/dL (ref 31.5–35.7)
MCV: 90 fL (ref 79–97)
Platelets: 304 10*3/uL (ref 150–450)
RBC: 4.49 x10E6/uL (ref 3.77–5.28)
RDW: 13.1 % (ref 11.7–15.4)
WBC: 4.6 10*3/uL (ref 3.4–10.8)

## 2019-01-25 LAB — COMPREHENSIVE METABOLIC PANEL
ALT: 15 IU/L (ref 0–32)
AST: 17 IU/L (ref 0–40)
Albumin/Globulin Ratio: 1.2 (ref 1.2–2.2)
Albumin: 3.8 g/dL (ref 3.7–4.7)
Alkaline Phosphatase: 56 IU/L (ref 39–117)
BUN/Creatinine Ratio: 19 (ref 12–28)
BUN: 13 mg/dL (ref 8–27)
Bilirubin Total: 0.6 mg/dL (ref 0.0–1.2)
CO2: 27 mmol/L (ref 20–29)
Calcium: 9.2 mg/dL (ref 8.7–10.3)
Chloride: 102 mmol/L (ref 96–106)
Creatinine, Ser: 0.69 mg/dL (ref 0.57–1.00)
GFR calc Af Amer: 100 mL/min/{1.73_m2} (ref >59)
GFR calc non Af Amer: 87 mL/min/{1.73_m2} (ref >59)
Globulin, Total: 3.1 g/dL (ref 1.5–4.5)
Glucose: 96 mg/dL (ref 65–99)
Potassium: 4.2 mmol/L (ref 3.5–5.2)
Sodium: 140 mmol/L (ref 134–144)
Total Protein: 6.9 g/dL (ref 6.0–8.5)

## 2019-01-25 LAB — LIPID PANEL
Chol/HDL Ratio: 2.2 ratio (ref 0.0–4.4)
Cholesterol, Total: 162 mg/dL (ref 100–199)
HDL: 75 mg/dL (ref >39)
LDL Chol Calc (NIH): 65 mg/dL (ref 0–99)
Triglycerides: 127 mg/dL (ref 0–149)
VLDL Cholesterol Cal: 22 mg/dL (ref 5–40)

## 2019-01-25 MED ORDER — METOPROLOL SUCCINATE ER 25 MG PO TB24: ORAL_TABLET | ORAL | 3 refills | Status: DC

## 2019-01-25 NOTE — Patient Instructions (Signed)
Medication Instructions:  Your physician has recommended you make the following change in your medication:  1.  INCREASE the Metoprolol to an extra 1/2 tablet daily ONLY AS NEEDED FOR PALPITATIONS  *If you need a refill on your cardiac medications before your next appointment, please call your pharmacy*  Lab Work: TODAY:  CMET, CBC, & LIPID  If you have labs (blood work) drawn today and your tests are completely normal, you will receive your results only by: Marland Kitchen MyChart Message (if you have MyChart) OR . A paper copy in the mail If you have any lab test that is abnormal or we need to change your treatment, we will call you to review the results.  Testing/Procedures: None ordered  Follow-Up: At [facility], you and your health needs are our priority.  As part of our continuing mission to provide you with exceptional heart care, we have created designated Provider Care Teams.  These Care Teams include your primary Cardiologist (physician) and Advanced Practice Providers (APPs -  Physician Assistants and Nurse Practitioners) who all work together to provide you with the care you need, when you need it.  Your next appointment:   12 months  The format for your next appointment:   In Person  Provider:   You may see Ena Dawley, MD or one of the following Advanced Practice Providers on your designated Care Team:    Melina Copa, PA-C  Ermalinda Barrios, PA-C   Other Instructions

## 2019-02-23 ENCOUNTER — Other Ambulatory Visit: Payer: Self-pay | Admitting: Internal Medicine

## 2019-04-04 ENCOUNTER — Telehealth: Payer: Self-pay | Admitting: Internal Medicine

## 2019-04-04 NOTE — Telephone Encounter (Signed)
Patient needs an appointment please

## 2019-04-04 NOTE — Telephone Encounter (Signed)
Copied from CRM [id number]. Topic: General - Inquiry >> Apr 04, 2019 12:01 PM Elizabeth Mills, [place] wrote: Reason for CRM: Pt called in stating she would like all all future scripts sent to [facility] from now on. Pt would also inform PCP that she has found a lump in her left elbow and it is causing a burning sensation. Pt noticed lump yesterday and would like advice on what to do.

## 2019-04-04 NOTE — Progress Notes (Signed)
Subjective:    Patient ID: Elizabeth Mills, female    DOB: 09/15/1945, 74 y.o.   MRN: [id number]  HPI The patient is here for an acute visit for Lump in left elbow causing a burning sensation   In the past she had tendinitis of the elbow and she wondered if that is what she had.  Her symptoms started about a week ago.  They started when she was at rest.  She denies any injury or unusual activities including repetitive activities.  She gets some swelling in the left anterior medial aspect of her elbow.  She describes the discomfort as a burning sensation.  Was tender to touch.  She took Aleve and put the brace on that she used when she had tendinitis.  The brace was tight and uncomfortable so she ended taking it off, but it seemed to help.  The swelling did go down.  She has seen some improvement since it started.  It is more painful when she uses her arm, specifically when she picks something up.  She denies any numbness, tingling or weakness in the hand.    Medications and allergies reviewed with patient and updated if appropriate.  Patient Active Problem List   Diagnosis Date Noted  . Sore throat 11/17/2017  . Hyperglycemia 10/28/2017  . Splitting of nail 10/28/2017  . Claudication (HCC) 01/15/2017  . Nephrolithiasis 09/22/2016  . Osteopenia 06/27/2015  . Amblyopia of right eye 03/07/2014  . Nuclear sclerosis of both eyes 03/07/2014  . Hip bursitis 01/25/2014  . Rapid palpitations 07/28/2012  . Alopecia 07/28/2012  . Nuclear cataract 02/04/2012  . Asthma   . Allergic rhinitis   . Hypercholesterolemia   . Back pain   . Colitis   . Hypothyroidism     Current Outpatient Medications on File Prior to Visit  Medication Sig Dispense Refill  . cholestyramine (QUESTRAN) 4 g packet Take 4 g by mouth 3 (three) times daily with meals.    Marland Kitchen estradiol (ESTRACE) 0.1 MG/GM vaginal cream Place 1 Applicatorful vaginally once a week.    . fluticasone (FLONASE) 50 MCG/ACT nasal spray  PLACE 2 SPRAYS INTO BOTH NOSTRILS DAILY AS NEEDED FOR ALLERGIES OR RHINITIS (ALLERGIES). 16 g 1  . Fluticasone-Salmeterol (ADVAIR DISKUS) 250-50 MCG/DOSE AEPB USE 1 INHALATION EVERY 12 HOURS (NEED APPOINTMENT WITH PRIMARY CARE PHYSICIAN FOR FURTHER REFILLS) 14 each 0  . loratadine (CLARITIN) 10 MG tablet Take 10 mg by mouth daily as needed for allergies (allergies).    . metoprolol succinate (TOPROL-XL) 25 MG 24 hr tablet TAKE 0.5 TABLET BY MOUTH DAILY, MAY ALSO TAKE AN EXTRA 0.5 TABLET DAILY AS NEEDED FOR PALPITATIONS 90 tablet 3  . OVER THE COUNTER MEDICATION Take 1 tablet by mouth 3 (three) times daily. IMMODIUM AD    . PROAIR HFA 108 (90 Base) MCG/ACT inhaler Inhale 1-2 puffs into the lungs every 6 (six) hours as needed for wheezing or shortness of breath. 1 Inhaler 8  . rosuvastatin (CRESTOR) 5 MG tablet Take 1 tablet by mouth 4 times a week , increase as tolerated 90 tablet 3  . SYNTHROID 75 MCG tablet TAKE 1 TABLET DAILY BEFORE BREAKFAST 90 tablet 3  . loperamide (IMODIUM) 2 MG capsule Take by mouth 2 (two) times daily as needed.     No current facility-administered medications on file prior to visit.    Past Medical History:  Diagnosis Date  . Allergic rhinitis, cause unspecified   . Asthma   . Back pain   .  Colitis   . Diverticulitis   . Generalized headaches   . GERD (gastroesophageal reflux disease)   . Hypercholesterolemia   . Hypothyroidism   . LVH (left ventricular hypertrophy)   . PSVT (paroxysmal supraventricular tachycardia) (HCC)   . Statin intolerance     Past Surgical History:  Procedure Laterality Date  . ABDOMINAL HYSTERECTOMY  1983  . APPENDECTOMY  1955  . BACK SURGERY    . BREAST BIOPSY  1982  . BREAST EXCISIONAL BIOPSY Bilateral   . COLONOSCOPY    . EYE SURGERY  11/14/2009, 1990   blocked tear duct repair  . TONSILLECTOMY  1953    Social History   Socioeconomic History  . Marital status: Married    Spouse name: Not on file  . Number of children:  2  . Years of education: 23  . Highest education level: Not on file  Occupational History  . Occupation: Homemaker  Tobacco Use  . Smoking status: Never Smoker  . Smokeless tobacco: Never Used  . Tobacco comment: married, lives with spouse - retired  Substance and Sexual Activity  . Alcohol use: No  . Drug use: No  . Sexual activity: Not Currently  Other Topics Concern  . Not on file  Social History Narrative   Regular exercise-yes, walk 2-3 times a week   Caffeine Use-no   Social Determinants of Health   Financial Resource Strain:   . Difficulty of Paying Living Expenses: Not on file  Food Insecurity:   . Worried About Programme researcher, broadcasting/film/video in the Last Year: Not on file  . Ran Out of Food in the Last Year: Not on file  Transportation Needs:   . Lack of Transportation (Medical): Not on file  . Lack of Transportation (Non-Medical): Not on file  Physical Activity:   . Days of Exercise per Week: Not on file  . Minutes of Exercise per Session: Not on file  Stress:   . Feeling of Stress : Not on file  Social Connections:   . Frequency of Communication with Friends and Family: Not on file  . Frequency of Social Gatherings with Friends and Family: Not on file  . Attends Religious Services: Not on file  . Active Member of Clubs or Organizations: Not on file  . Attends Banker Meetings: Not on file  . Marital Status: Not on file    Family History  Problem Relation Age of Onset  . Cancer Mother        Kidney and Ovarian Cancer  . Arthritis Mother   . Hyperlipidemia Mother   . Arthritis Father   . Hyperlipidemia Father   . Breast cancer Neg Hx     Review of Systems Per HPI    Objective:   Vitals:   04/05/19 1514  BP: 128/78  Pulse: 72  Resp: 16  Temp: 98.2 F (36.8 C)  SpO2: 98%   BP Readings from Last 3 Encounters:  04/05/19 128/78  01/25/19 102/60  01/19/18 112/62   Wt Readings from Last 3 Encounters:  04/05/19 134 lb (60.8 kg)  01/25/19  132 lb 6.4 oz (60.1 kg)  01/19/18 134 lb (60.8 kg)   Body mass index is 23.74 kg/m.   Physical Exam Constitutional:      General: She is not in acute distress.    Appearance: Normal appearance. She is not ill-appearing.  Musculoskeletal:     Comments: Left elbow with mild swelling anterior-medial aspect with focal tenderness.  No erythema or  bruising.  No noted weakness.  No increased pain with passive and active movement of the elbow.  No pain in the forearm.  Strength in hand normal.  Normal sensation.  Some pain with pronation.  Biceps muscle seems to be intact  Skin:    General: Skin is warm and dry.     Findings: No bruising or erythema.  Neurological:     Mental Status: She is alert.     Sensory: No sensory deficit.            Assessment & Plan:    See Problem List for Assessment and Plan of chronic medical problems.     This visit occurred during the SARS-CoV-2 public health emergency.  Safety protocols were in place, including screening questions prior to the visit, additional usage of staff PPE, and extensive cleaning of exam room while observing appropriate contact time as indicated for disinfecting solutions.

## 2019-04-05 ENCOUNTER — Other Ambulatory Visit: Payer: Self-pay

## 2019-04-05 ENCOUNTER — Encounter: Payer: Self-pay | Admitting: Internal Medicine

## 2019-04-05 ENCOUNTER — Ambulatory Visit (INDEPENDENT_AMBULATORY_CARE_PROVIDER_SITE_OTHER): Payer: Medicare Other | Admitting: Internal Medicine

## 2019-04-05 DIAGNOSIS — M25522 Pain in left elbow: Secondary | ICD-10-CM | POA: Diagnosis not present

## 2019-04-05 NOTE — Patient Instructions (Signed)
Take the aleve daily with food for a few days.   You can ice/ heat.  Revise your activities.   Try voltaren gel.     Please call if there is no improvement in your symptoms so we can set you up with a sports medicine doctor.

## 2019-04-05 NOTE — Assessment & Plan Note (Signed)
Acute Possible tendinitis No injury and unlikely muscle tear Improved with brace and Aleve We will treat conservatively-Ice or heat, take Aleve as tolerated as long as it does not upset her stomach, can try Voltaren gel Revise activities She will let me know if there is no improvement in which case I will refer to orthopedics or sports medicine

## 2019-04-18 ENCOUNTER — Ambulatory Visit: Payer: Medicare Other | Attending: Internal Medicine

## 2019-04-18 DIAGNOSIS — Z23 Encounter for immunization: Secondary | ICD-10-CM | POA: Insufficient documentation

## 2019-04-18 NOTE — Progress Notes (Signed)
   Covid-19 Vaccination Clinic  Name:  Elizabeth Mills    MRN: [id number] DOB: 1945/04/20  04/18/2019  [name] was observed post Covid-19 immunization for 15 minutes without incidence. She was provided with Vaccine Information Sheet and instruction to access the V-Safe system.   [name] was instructed to call 911 with any severe reactions post vaccine: Marland Kitchen Difficulty breathing  . Swelling of your face and throat  . A fast heartbeat  . A bad rash all over your body  . Dizziness and weakness    Immunizations Administered    Name Date Dose VIS Date Route   Pfizer COVID-19 Vaccine 04/18/2019 10:47 AM 0.3 mL 03/04/2019 Intramuscular   Manufacturer: [employer], [employer]   Lot: [id number]   NDC: [address]-3

## 2019-04-27 ENCOUNTER — Encounter: Payer: Self-pay | Admitting: Internal Medicine

## 2019-04-27 ENCOUNTER — Ambulatory Visit (INDEPENDENT_AMBULATORY_CARE_PROVIDER_SITE_OTHER): Payer: Medicare Other | Admitting: Internal Medicine

## 2019-04-27 DIAGNOSIS — M25522 Pain in left elbow: Secondary | ICD-10-CM

## 2019-04-27 DIAGNOSIS — M5481 Occipital neuralgia: Secondary | ICD-10-CM | POA: Diagnosis not present

## 2019-04-27 NOTE — Assessment & Plan Note (Signed)
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

## 2019-04-27 NOTE — Progress Notes (Signed)
Virtual Visit via Video Note  I connected with Elizabeth Mills on 04/27/19 at 11:00 AM EST by a video enabled telemedicine application and verified that I am speaking with the correct person using two identifiers.   I discussed the limitations of evaluation and management by telemedicine and the availability of in person appointments. The patient expressed understanding and agreed to proceed.  Present for the visit:  Myself, Dr Cheryll Cockayne, Robin Searing.  The patient is currently at home and I am in the office.    No referring provider.    History of Present Illness:   A couple of weeks ago she was experiencing pain in the back of her head that radiated up the back of her head.  It felt like the head was asleep.  It did end up going away, but returned a few nights ago.  This time I went further up the head and again felt like it was going to sleep.  It did go away, but the back of her head feels a little sore or tender.  All of this was more on the right posterior side.  She denies any neck pain or stiffness, but does have some chronic neck issues.  She did take aspirin with the first episode because she was concerned about circulatory problem or possible stroke.  She also took Aleve at one point.  She is unsure if those helped or the symptoms just improved on their own.  She did not have any numbness, tingling or weakness in her arms or legs, difficulty speaking/swallowing or changes in vision.   Review of Systems  Constitutional: Negative for fever.  Musculoskeletal: Negative for neck pain.  Neurological: Positive for dizziness (a little - may have been sinus related), tingling and headaches. Negative for speech change and weakness.      Social History   Socioeconomic History  . Marital status: Married    Spouse name: Not on file  . Number of children: 2  . Years of education: 98  . Highest education level: Not on file  Occupational History  . Occupation: Homemaker  Tobacco  Use  . Smoking status: Never Smoker  . Smokeless tobacco: Never Used  . Tobacco comment: married, lives with spouse - retired  Substance and Sexual Activity  . Alcohol use: No  . Drug use: No  . Sexual activity: Not Currently  Other Topics Concern  . Not on file  Social History Narrative   Regular exercise-yes, walk 2-3 times a week   Caffeine Use-no   Social Determinants of Health   Financial Resource Strain:   . Difficulty of Paying Living Expenses: Not on file  Food Insecurity:   . Worried About Programme researcher, broadcasting/film/video in the Last Year: Not on file  . Ran Out of Food in the Last Year: Not on file  Transportation Needs:   . Lack of Transportation (Medical): Not on file  . Lack of Transportation (Non-Medical): Not on file  Physical Activity:   . Days of Exercise per Week: Not on file  . Minutes of Exercise per Session: Not on file  Stress:   . Feeling of Stress : Not on file  Social Connections:   . Frequency of Communication with Friends and Family: Not on file  . Frequency of Social Gatherings with Friends and Family: Not on file  . Attends Religious Services: Not on file  . Active Member of Clubs or Organizations: Not on file  . Attends Banker  Meetings: Not on file  . Marital Status: Not on file     Observations/Objective: Appears well in NAD Breathing normally Grossly neurological exam nonfocal   Assessment and Plan:  See Problem List for Assessment and Plan of chronic medical problems.   Follow Up Instructions:    I discussed the assessment and treatment plan with the patient. The patient was provided an opportunity to ask questions and all were answered. The patient agreed with the plan and demonstrated an understanding of the instructions.   The patient was advised to call back or seek an in-person evaluation if the symptoms worsen or if the condition fails to improve as anticipated.    Binnie Rail, MD

## 2019-04-29 ENCOUNTER — Other Ambulatory Visit: Payer: Self-pay

## 2019-04-29 ENCOUNTER — Ambulatory Visit: Payer: Self-pay

## 2019-04-29 ENCOUNTER — Encounter: Payer: Self-pay | Admitting: Family Medicine

## 2019-04-29 ENCOUNTER — Ambulatory Visit (INDEPENDENT_AMBULATORY_CARE_PROVIDER_SITE_OTHER): Payer: Medicare Other | Admitting: Family Medicine

## 2019-04-29 VITALS — BP 110/70 | HR 73 | Ht 63.0 in | Wt 134.0 lb

## 2019-04-29 DIAGNOSIS — M25522 Pain in left elbow: Secondary | ICD-10-CM | POA: Diagnosis not present

## 2019-04-29 DIAGNOSIS — M7702 Medial epicondylitis, left elbow: Secondary | ICD-10-CM | POA: Diagnosis not present

## 2019-04-29 NOTE — Patient Instructions (Signed)
Thank you for coming in today. 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  With the elbow straight holding a 1 pound hand weight.  Allow the wrist to go from flexed up position to down slowly. It should take about 3 seconds.   Also using a hammer turn the hand palm side up and down slowly.   Do about 30 reps 2x daily.   Do the stretch with the elbow straight multiple times per day.   Recheck if not improving .   Golfer's Elbow Rehab Ask your health care provider which exercises are safe for you. Do exercises exactly as told by your health care provider and adjust them as directed. It is normal to feel mild stretching, pulling, tightness, or discomfort as you do these exercises. Stop right away if you feel sudden pain or your pain gets worse. Do not begin these exercises until told by your health care provider. Stretching and range-of-motion exercises These exercises warm up your muscles and joints and improve the movement and flexibility of your elbow. Wrist extension  1. Straighten your left / right elbow in front of you with your palm facing up toward the ceiling. ? If told by your health care provider, bend your left / right elbow to a 90-degree angle (right angle) at your side. 2. With your other hand, gently pull your left / right hand and fingers toward the floor (extension). Stop when you feel a gentle stretch on the palm side of your forearm. 3. Hold this position for __________ seconds. Repeat __________ times. Complete this exercise __________ times a day. Wrist flexion  1. Straighten your left / right elbow in front of you with your palm facing down toward the floor. ? If told by your health care provider, bend your left / right elbow to a 90-degree angle (right angle) at your side. 2. With your other hand, gently push over the back of your left / right hand so your fingers point toward  the floor (flexion). Stop when you feel a gentle stretch on the back of your forearm. 3. Hold this position for __________ seconds. Repeat __________ times. Complete this exercise __________ times a day. Forearm rotation, supination 1. Sit or stand with your elbows at your side. 2. Bend your left / right elbow to a 90-degree angle (right angle). 3. Using your uninjured hand, turn your left / right palm up toward the ceiling (supination) until you feel a gentle stretch along the inside of your forearm. 4. Hold this position for __________ seconds. Repeat __________ times. Complete this exercise __________ times a day. Forearm rotation, pronation 1. Sit or stand with your elbows at your side. 2. Bend your left / right elbow to a 90-degree angle (right angle). 3. Using your uninjured hand, turn your left / right palm down toward the floor (pronation) until you feel a gentle stretch along the top of your forearm. 4. Hold this position for __________ seconds. Repeat __________ times. Complete this exercise __________ times a day. Strengthening exercises These exercises build strength and endurance in your elbow. Endurance is the ability to use your muscles for a long time, even after they get tired. Wrist flexion  1. Sit with your left / right forearm supported on a table or other surface and your palm turned up toward the ceiling. Let your left / right wrist extend over the edge of the surface. 2. Hold a __________  weight or a piece of rubber exercise band or tubing. ? If using a rubber exercise band or tubing, hold the other end of the tubing with your other hand. 3. Slowly bend your wrist so your hand moves up toward the ceiling (flexion). Try to only move your wrist and keep the rest of your arm still. 4. Hold this position for __________ seconds. 5. Slowly return to the starting position. Repeat __________ times. Complete this exercise __________ times a day. Wrist flexion, eccentric 1.  Sit with your left / right forearm palm-up and supported on a table or other surface. Let your left / right wrist extend over the edge of the surface. 2. Hold a __________ weight or a piece of rubber exercise band or tubing in your left / right hand. ? If using a rubber exercise band or tubing, hold the other end of the tubing with your other hand. 3. Use your uninjured hand to move your left / right hand up toward the ceiling. 4. Take your uninjured hand away and slowly return to the starting position using only your left / right hand (eccentric flexion). Repeat __________ times. Complete this exercise __________ times a day. Forearm rotation, pronation To do this exercise, you will need a lightweight hammer or rubber mallet. 1. Sit with your left / right forearm supported on a table or other surface. Bend your elbow to a 90-degree angle (right angle). Position your forearm so that your palm is facing up toward the ceiling, with your hand resting over the edge of the table. 2. Hold a hammer in your left / right hand. ? To make this exercise easier, hold the hammer near the head of the hammer. ? To make this exercise harder, hold the hammer near the end of the handle. 3. Without moving your elbow, slowly turn (rotate) your forearm so your palm faces down toward the floor (pronation). 4. Hold this position for __________ seconds. 5. Slowly return to the starting position. Repeat __________ times. Complete this exercise __________ times a day. Shoulder blade squeeze 1. Sit in a stable chair or stand with good posture. If you are sitting down, do not let your back touch the back of the chair. 2. Your arms should be at your sides with your elbows bent to a 90-degree angle (right angle). Position your forearms so that your thumbs are facing the ceiling (neutral position). 3. Without lifting your shoulders up, squeeze your shoulder blades tightly together. 4. Hold this position for __________ seconds.  5. Slowly release and return to the starting position. Repeat __________ times. Complete this exercise __________ times a day. This information is not intended to replace advice given to you by your health care provider. Make sure you discuss any questions you have with your health care provider. Document Revised: 07/01/2018 Document Reviewed: 05/04/2018 Elsevier Patient Education  [employer].

## 2019-04-29 NOTE — Progress Notes (Signed)
    Subjective:    CC: L elbow pain  I, Elizabeth Mills, LAT, ATC, am serving as scribe for Dr. Clementeen Mills.  HPI: Pt is a 74 y/o female presenting w/ c/o L elbow pain x approximately 2 weeks.  She reports noticing a lump at her L anterior-medial elbow w/ swelling and burning pain.  She has a hx of lateral epicondylitis so tried her tennis elbow strap which helped to an extent but was tight and uncomfortable so stopped using this.  She has also tried Aleve w/ limited relief.  She has seen her PCP for these c/o and was referred to [facility].  Since her visit w/ Dr. Lawerance Bach, pt reports about 40-50% improvement.  Radiating pain: Yes into the L forearm Numbness/tingling: No Aggravating factors: Reaching out to the side or trying to pick up items w/ her L hand Treatments tried: Aleve and tennis elbow strap  Pertinent review of Systems: No fevers or chills  Relevant historical information: History of colitis asthma and history right lateral epicondylitis in the past.   Objective:    Vitals:   04/29/19 1029  BP: 110/70  Pulse: 73  SpO2: 98%   General: Well Developed, well nourished, and in no acute distress.   MSK:  Left elbow normal-appearing Normal elbow motion. Mildly tender palpation medial epicondyle. Mild pain with resisted wrist extension.  No significant pain with resisted wrist pronation. Strength is intact.  Pulses cap refill sensation are intact distally.    Lab and Radiology Results  Limited musculoskeletal ultrasound left medial elbow. Normal-appearing lateral epicondyle. No significant tendon tears visible.  Mild hypoechoic fluid tracking superficial to common flexor insertion. Slightly degenerative appearance of ulnar elbow joint with no effusion. Impression: Medial epicondylitis  Impression and Recommendations:    Assessment and Plan: 74 y.o. female with left elbow medial epicondylitis. Plan to treat with diclofenac gel and home exercise  program and watchful waiting.  If not better return to clinic proceed with injection and further evaluation.   Orders Placed This Encounter  Procedures  . [place] LIMITED JOINT SPACE STRUCTURES UP LEFT(NO LINKED CHARGES)    Order Specific Question:   Reason for Exam (SYMPTOM  OR DIAGNOSIS REQUIRED)    Answer:   L elbow pain    Order Specific Question:   Preferred imaging location?    Answer:   [facility]   No orders of the defined types were placed in this encounter.   Discussed warning signs or symptoms. Please see discharge instructions. Patient expresses understanding.   The above documentation has been reviewed and is accurate and complete Elizabeth Mills

## 2019-05-06 ENCOUNTER — Ambulatory Visit: Payer: Medicare Other

## 2019-05-09 ENCOUNTER — Ambulatory Visit: Payer: Medicare Other | Attending: Internal Medicine

## 2019-05-09 DIAGNOSIS — Z23 Encounter for immunization: Secondary | ICD-10-CM | POA: Insufficient documentation

## 2019-05-09 NOTE — Progress Notes (Signed)
   Covid-19 Vaccination Clinic  Name:  Elizabeth Mills    MRN: [id number] DOB: 04-12-1945  05/09/2019  [name] was observed post Covid-19 immunization for 15 minutes without incidence. She was provided with Vaccine Information Sheet and instruction to access the V-Safe system.   [name] was instructed to call 911 with any severe reactions post vaccine: Marland Kitchen Difficulty breathing  . Swelling of your face and throat  . A fast heartbeat  . A bad rash all over your body  . Dizziness and weakness    Immunizations Administered    Name Date Dose VIS Date Route   Pfizer COVID-19 Vaccine 05/09/2019 11:09 AM 0.3 mL 03/04/2019 Intramuscular   Manufacturer: [employer], [employer]   Lot: [id number]   NDC: [address]-0

## 2019-05-10 ENCOUNTER — Other Ambulatory Visit: Payer: Self-pay | Admitting: Internal Medicine

## 2019-05-10 MED ORDER — PROAIR HFA 108 (90 BASE) MCG/ACT IN AERS: 1.0000 | INHALATION_SPRAY | Freq: Four times a day (QID) | RESPIRATORY_TRACT | 1 refills | Status: DC | PRN

## 2019-05-10 MED ORDER — SYNTHROID 75 MCG PO TABS: 75.0000 ug | ORAL_TABLET | Freq: Every day | ORAL | 1 refills | Status: DC

## 2019-05-10 NOTE — Telephone Encounter (Signed)
       1. Which medications need to be refilled? (please list name of each medication and dose if known)  SYNTHROID 75 MCG tablet PROAIR HFA 108 (90 Base) MCG/ACT inhaler   2. Which pharmacy/location (including street and city if local pharmacy) is medication to be sent to? [id number] - [place], [place] - [address]  3. Do they need a 30 day or 90 day supply? 30

## 2019-05-16 ENCOUNTER — Other Ambulatory Visit: Payer: Self-pay

## 2019-05-16 MED ORDER — METOPROLOL SUCCINATE ER 25 MG PO TB24: ORAL_TABLET | ORAL | 3 refills | Status: DC

## 2019-05-19 ENCOUNTER — Other Ambulatory Visit: Payer: Self-pay | Admitting: Physician Assistant

## 2019-06-21 ENCOUNTER — Other Ambulatory Visit: Payer: Self-pay

## 2019-06-21 MED ORDER — PROAIR HFA 108 (90 BASE) MCG/ACT IN AERS: 1.0000 | INHALATION_SPRAY | Freq: Four times a day (QID) | RESPIRATORY_TRACT | 1 refills | Status: DC | PRN

## 2019-07-06 ENCOUNTER — Other Ambulatory Visit: Payer: Self-pay | Admitting: Internal Medicine

## 2019-08-02 ENCOUNTER — Other Ambulatory Visit: Payer: Self-pay | Admitting: Internal Medicine

## 2019-08-02 DIAGNOSIS — Z1231 Encounter for screening mammogram for malignant neoplasm of breast: Secondary | ICD-10-CM

## 2019-08-05 ENCOUNTER — Ambulatory Visit
Admission: RE | Admit: 2019-08-05 | Discharge: 2019-08-05 | Disposition: A | Discharge status: Home / Self Care | Payer: Medicare Other | Source: Ambulatory Visit | Attending: Internal Medicine | Admitting: Internal Medicine

## 2019-08-05 ENCOUNTER — Other Ambulatory Visit: Payer: Self-pay

## 2019-08-05 DIAGNOSIS — Z1231 Encounter for screening mammogram for malignant neoplasm of breast: Secondary | ICD-10-CM

## 2019-09-01 ENCOUNTER — Other Ambulatory Visit: Payer: Self-pay | Admitting: Internal Medicine

## 2019-09-07 ENCOUNTER — Telehealth: Payer: Self-pay

## 2019-09-07 NOTE — Telephone Encounter (Signed)
1.Medication Requested:SYNTHROID 75 MCG tablet  2. Pharmacy (Name, Street, [place]):[facility] - [place], [place] - [address], [address]  3. On Med List: Yes   4. Last Visit with PCP: 2.3.21  5. Next visit date with PCP: n/a   Agent: Please be advised that RX refills may take up to 3 business days. We ask that you follow-up with your pharmacy.

## 2019-09-08 ENCOUNTER — Other Ambulatory Visit: Payer: Self-pay

## 2019-09-08 MED ORDER — SYNTHROID 75 MCG PO TABS: 75.0000 ug | ORAL_TABLET | Freq: Every day | ORAL | 1 refills | Status: DC

## 2019-09-08 NOTE — Telephone Encounter (Signed)
Faxed in today for patient 

## 2019-10-12 ENCOUNTER — Other Ambulatory Visit: Payer: Self-pay

## 2019-10-12 MED ORDER — ROSUVASTATIN CALCIUM 5 MG PO TABS: ORAL_TABLET | ORAL | 2 refills | Status: DC

## 2019-11-01 ENCOUNTER — Other Ambulatory Visit: Payer: Self-pay | Admitting: Internal Medicine

## 2019-11-28 ENCOUNTER — Telehealth: Payer: Medicare Other | Admitting: Nurse Practitioner

## 2019-11-28 DIAGNOSIS — R300 Dysuria: Secondary | ICD-10-CM | POA: Diagnosis not present

## 2019-11-28 MED ORDER — CEPHALEXIN 500 MG PO CAPS: 500.0000 mg | ORAL_CAPSULE | Freq: Two times a day (BID) | ORAL | 0 refills | Status: DC

## 2019-11-28 NOTE — Progress Notes (Signed)
We are sorry that you are not feeling well.  Here is how we plan to help!  Based on what you shared with me it looks like you most likely have a simple urinary tract infection.  A UTI (Urinary Tract Infection) is a bacterial infection of the bladder.  Most cases of urinary tract infections are simple to treat but a key part of your care is to encourage you to drink plenty of fluids and watch your symptoms carefully.  I have prescribed Keflex 500 mg twice a day for 7 days.  Your symptoms should gradually improve. Call us if the burning in your urine worsens, you develop worsening fever, back pain or pelvic pain or if your symptoms do not resolve after completing the antibiotic.  * IF this does work and still have blood in urine tomorrow, you may considered seeing someone about kidney stone. Urinary tract infections can be prevented by drinking plenty of water to keep your body hydrated.  Also be sure when you wipe, wipe from front to back and don't hold it in!  If possible, empty your bladder every 4 hours.  Your e-visit answers were reviewed by a board certified advanced clinical practitioner to complete your personal care plan.  Depending on the condition, your plan could have included both over the counter or prescription medications.  If there is a problem please reply  once you have received a response from your provider.  Your safety is important to [place].  If you have drug allergies check your prescription carefully.    You can use MyChart to ask questions about today's visit, request a non-urgent call back, or ask for a work or school excuse for 24 hours related to this e-Visit. If it has been greater than 24 hours you will need to follow up with your provider, or enter a new e-Visit to address those concerns.   You will get an e-mail in the next two days asking about your experience.  I hope that your e-visit has been valuable and will speed your recovery. Thank you for using  e-visits.  5-10 minutes spent reviewing and documenting in chart.

## 2020-01-29 ENCOUNTER — Other Ambulatory Visit: Payer: Self-pay | Admitting: Internal Medicine

## 2020-02-15 ENCOUNTER — Telehealth: Payer: Self-pay | Admitting: Internal Medicine

## 2020-02-15 DIAGNOSIS — M6530 Trigger finger, unspecified finger: Secondary | ICD-10-CM

## 2020-02-15 NOTE — Telephone Encounter (Signed)
Patient states she thinks her right thumb has "trigger finger" and she wants to know if she can get a referral to Dr. Amanda Pea at [facility].

## 2020-02-18 ENCOUNTER — Other Ambulatory Visit: Payer: Self-pay | Admitting: Internal Medicine

## 2020-02-20 NOTE — Telephone Encounter (Signed)
Referral ordered

## 2020-02-23 NOTE — Progress Notes (Signed)
Cardiology Office Note    Date:  02/24/2020   ID:  Kimarie, Coor [birth year], MRN [id number]  PCP:  Binnie Rail, MD  Cardiologist:  Ena Dawley, MD  Electrophysiologist:  None   Chief Complaint: f/u PSVT  History of Present Illness:   Elizabeth Mills is a 74 y.o. female with history of palpitations/PSVT per Dr. Sherryl Barters remote note, HLD (intolerant of higher intensity statins), hypothyroidism, asthma, colitis, GERD, prior LVH, aortic valve sclerosis presents for routine cardiology follow-up. Remote echo 2008 showed mild-mod LVH, EF normal, + impared relaxation, mild aortic valve sclerosis without stenosis. I met her in 2019 for evaluation of shortness of breath without obvious cardiac etiology. 2D echo 12/10/17 showed EF 55-73%, normal diastolic parameters, mild TR, PASP upper limits of normal. Symptoms resolved spontaneously so further testing was deferred.  She is seen back for follow-up doing well having had a fairly uneventful year.  She has been doing regular walking but admits she is not exercising as much due to some right hip pain that she states feels like a pulling sensation across to her inner thigh. She has not had any recurrent dyspnea like in 2019. She does occasionally notice a brief sensation of having to get used to breathing when she lies down to sleep at night lasting only seconds. She has not had any more sustained orthopnea. No chest pain, edema, syncope. She has had 1-2 brief episodes of palpitations since last visit but otherwise largely doing well.  Labwork independently reviewed: 01/2019 LDL 65, CBC and CMET wnl 08/2018 TSH wnl   Past Medical History:  Diagnosis Date  . Allergic rhinitis, cause unspecified   . Asthma   . Back pain   . Colitis   . Diverticulitis   . Generalized headaches   . GERD (gastroesophageal reflux disease)   . Hypercholesterolemia   . Hypothyroidism   . LVH (left ventricular hypertrophy)   . PSVT  (paroxysmal supraventricular tachycardia) ([place])   . Statin intolerance     Past Surgical History:  Procedure Laterality Date  . ABDOMINAL HYSTERECTOMY  1983  . APPENDECTOMY  1955  . BACK SURGERY    . BREAST BIOPSY  1982  . BREAST EXCISIONAL BIOPSY Bilateral   . COLONOSCOPY    . EYE SURGERY  11/14/2009, 1990   blocked tear duct repair  . TONSILLECTOMY  1953    Current Medications: Current Meds  Medication Sig  . estradiol (ESTRACE) 0.1 MG/GM vaginal cream Place 1 Applicatorful vaginally once a week.  . fluticasone (FLONASE) 50 MCG/ACT nasal spray PLACE 2 SPRAYS INTO BOTH NOSTRILS DAILY AS NEEDED FOR ALLERGIES OR RHINITIS (ALLERGIES).  . Fluticasone-Salmeterol (ADVAIR) 250-50 MCG/DOSE AEPB Inhale 1 puff into the lungs 2 (two) times daily.  Marland Kitchen loratadine (CLARITIN) 10 MG tablet Take 10 mg by mouth daily as needed for allergies (allergies).  . metoprolol succinate (TOPROL-XL) 25 MG 24 hr tablet TAKE 0.5 TABLET BY MOUTH DAILY, MAY ALSO TAKE AN EXTRA 0.5 TABLET DAILY AS NEEDED FOR PALPITATIONS  . PROAIR HFA 108 (90 Base) MCG/ACT inhaler INHALE 1-2 PUFFS INTO THE LUNGS EVERY 6 (SIX) HOURS AS NEEDED FOR WHEEZING OR SHORTNESS OF BREATH.  . Probiotic Product (DIGESTIVE ADV PREBIOT+PROBIOT) CHEW Patient take 2 gummies by mouth daily   . rosuvastatin (CRESTOR) 40 MG tablet Take 40 mg by mouth 3 (three) times a week.  Marland Kitchen SYNTHROID 75 MCG tablet TAKE 1 TABLET BY MOUTH  DAILY BEFORE BREAKFAST  . VITAMIN D, CHOLECALCIFEROL, PO Patient take  2 gummies (2,000 units total) by mouth daily       Allergies:   Crestor [rosuvastatin calcium], Lipitor [atorvastatin calcium], Phenergan [promethazine hcl], Pravachol, Promethazine, Promethazine hcl, Sulfa antibiotics, Sulfamethoxazole, Zetia [ezetimibe], and Zocor [simvastatin - high dose]   Social History   Socioeconomic History  . Marital status: Married    Spouse name: Not on file  . Number of children: 2  . Years of education: 73  . Highest education  level: Not on file  Occupational History  . Occupation: Homemaker  Tobacco Use  . Smoking status: Never Smoker  . Smokeless tobacco: Never Used  . 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 Vaping Use: Never used  Substance and Sexual Activity  . Alcohol use: No  . Drug use: No  . Sexual activity: Not Currently  Other Topics Concern  . Not on file  Social History Narrative   Regular exercise-yes, walk 2-3 times a week   Caffeine Use-no   Social Determinants of Health   Financial Resource Strain:   . Difficulty of Paying Living Expenses: Not on file  Food Insecurity:   . Worried About Charity fundraiser in the Last Year: Not on file  . Ran Out of Food in the Last Year: Not on file  Transportation Needs:   . Lack of Transportation (Medical): Not on file  . Lack of Transportation (Non-Medical): Not on file  Physical Activity:   . Days of Exercise per Week: Not on file  . Minutes of Exercise per Session: Not on file  Stress:   . Feeling of Stress : Not on file  Social Connections:   . Frequency of Communication with Friends and Family: Not on file  . Frequency of Social Gatherings with Friends and Family: Not on file  . Attends Religious Services: Not on file  . Active Member of Clubs or Organizations: Not on file  . Attends Archivist Meetings: Not on file  . Marital Status: Not on file     Family History:  The patient's family history includes Arthritis in her father and mother; Cancer in her mother; Hyperlipidemia in her father and mother. There is no history of Breast cancer.  ROS:   Please see the history of present illness. All other systems are reviewed and otherwise negative.    EKGs/Labs/Other Studies Reviewed:    Studies reviewed are outlined and summarized above. Reports included below if pertinent.  2D echo 2019  - Left ventricle: The cavity size was normal. Systolic function was  normal. The estimated ejection  fraction was in the range of 60%  to 65%. Wall motion was normal; there were no regional wall  motion abnormalities. Left ventricular diastolic function  parameters were normal.  - Aortic valve: There was no regurgitation.  - Mitral valve: There was no regurgitation.  - Right ventricle: The cavity size was normal. Wall thickness was  normal. Systolic function was normal.  - Right atrium: The atrium was normal in size.  - Tricuspid valve: There was mild regurgitation.  - Pulmonary arteries: Systolic pressure was at the upper limits of  normal.  - Inferior vena cava: The vessel was normal in size.  - Pericardium, extracardiac: There was no pericardial effusion.     EKG:  EKG is ordered today, personally reviewed, demonstrating NSR 73bpm, baseline artifact otherwise no acute changes  Recent Labs: No results found for requested labs within last 8760 hours.  Recent Lipid Panel  Component Value Date/Time   CHOL 162 01/25/2019 1040   TRIG 127 01/25/2019 1040   HDL 75 01/25/2019 1040   CHOLHDL 2.2 01/25/2019 1040   CHOLHDL 2.5 04/26/2015 0939   VLDL 12 04/26/2015 0939   LDLCALC 65 01/25/2019 1040   LDLDIRECT 94.2 07/28/2012 1010    PHYSICAL EXAM:    VS:  BP 104/80   Pulse 73   Ht _0  (1.6 m)   Wt 135 lb 9.6 oz (61.5 kg)   SpO2 96%   BMI 24.02 kg/m   BMI: Body mass index is 24.02 kg/m.  GEN: Well nourished, well developed WF, in no acute distress HEENT: normocephalic, atraumatic Neck: no JVD, carotid bruits, or masses Cardiac: RRR; no murmurs, rubs, or gallops, no edema  Respiratory:  clear to auscultation bilaterally, normal work of breathing GI: soft, nontender, nondistended, + BS MS: no deformity or atrophy Skin: warm and dry, no rash Neuro:  Alert and Oriented x 3, Strength and sensation are intact, follows commands Psych: euthymic mood, full affect  Wt Readings from Last 3 Encounters:  02/24/20 135 lb 9.6 oz (61.5 kg)  04/29/19 134 lb (60.8 kg)    04/05/19 134 lb (60.8 kg)     ASSESSMENT & PLAN:   1. PSVT - largely quiescent. She reports only 1-2 brief episodes of palpitations in recent months.  These are not particularly bothersome to her and were very transient.  We will continue present regimen with metoprolol without any changes at this time.  She is due for her yearly baseline blood work.  The last time this was evaluated was in November 2020.  She is not fasting today.  She also is in need of seeing her primary care due to some persistent hip pain over the last few months.  Per our discussion, she will get an appointment with her primary care at which time she plans to get her baseline labs done.  I have asked her to have her primary care send [place] a copy of labs for review.  2. Hyperlipidemia - at this point she is tolerating 3x/week Crestor well without complaints.  See above regarding plan for labs.  It does not make sense to check them today since she is not fasting.  We could consider bringing her back to our office on another day, but since she needs to see her primary care anyway, she prefers to get them done at their office.  We will await those results.  3. History of shortness of breath - she as prior history of DOE which remains resolved. No angina reported either.  She does describe a fleeting sensation of having to steady her breath sometimes when she first lays down at night to go to sleep but this quickly resolves without intervention.  This is atypical for orthopnea and her exam is benign without any signs of hypervolemia.  We will have her observe for more frequent or persistent symptoms.  Disposition: F/u with Dr. Meda Coffee in 1 year.   Medication Adjustments/Labs and Tests Ordered: Current medicines are reviewed at length with the patient today.  Concerns regarding medicines are outlined above. Medication changes, Labs and Tests ordered today are summarized above and listed in the Patient Instructions accessible in  Encounters.   Signed, Charlie Pitter, PA-C  02/24/2020 10:36 AM    [address], [place], [place]  [address] Phone: [phone]; Fax: [phone]

## 2020-02-24 ENCOUNTER — Other Ambulatory Visit: Payer: Self-pay

## 2020-02-24 ENCOUNTER — Ambulatory Visit (INDEPENDENT_AMBULATORY_CARE_PROVIDER_SITE_OTHER): Payer: Medicare Other | Admitting: Physician Assistant

## 2020-02-24 ENCOUNTER — Encounter: Payer: Self-pay | Admitting: Physician Assistant

## 2020-02-24 VITALS — BP 104/80 | HR 73 | Ht 63.0 in | Wt 135.6 lb

## 2020-02-24 DIAGNOSIS — I471 Supraventricular tachycardia: Secondary | ICD-10-CM | POA: Diagnosis not present

## 2020-02-24 DIAGNOSIS — E785 Hyperlipidemia, unspecified: Secondary | ICD-10-CM

## 2020-02-24 DIAGNOSIS — R0602 Shortness of breath: Secondary | ICD-10-CM

## 2020-02-24 NOTE — Patient Instructions (Signed)
Medication Instructions:  Your physician recommends that you continue on your current medications as directed. Please refer to the Current Medication list given to you today.   *If you need a refill on your cardiac medications before your next appointment, please call your pharmacy*   Lab Work: None ordered  If you have labs (blood work) drawn today and your tests are completely normal, you will receive your results only by: Marland Kitchen MyChart Message (if you have MyChart) OR . A paper copy in the mail If you have any lab test that is abnormal or we need to change your treatment, we will call you to review the results.   Testing/Procedures: None ordered   Follow-Up: At [facility], you and your health needs are our priority.  As part of our continuing mission to provide you with exceptional heart care, we have created designated Provider Care Teams.  These Care Teams include your primary Cardiologist (physician) and Advanced Practice Providers (APPs -  Physician Assistants and Nurse Practitioners) who all work together to provide you with the care you need, when you need it.  We recommend signing up for the patient portal called "MyChart".  Sign up information is provided on this After Visit Summary.  MyChart is used to connect with patients for Virtual Visits (Telemedicine).  Patients are able to view lab/test results, encounter notes, upcoming appointments, etc.  Non-urgent messages can be sent to your provider as well.   To learn more about what you can do with MyChart, go to [email or web address].    Your next appointment:   12 month(s)  The format for your next appointment:   In Person  Provider:   You may see Tobias Alexander, MD or one of the following Advanced Practice Providers on your designated Care Team:    Ronie Spies, PA-C  Jacolyn Reedy, PA-C    Other Instructions When you see your primary care dr, please have them forward [place] a copy of your labs.

## 2020-02-29 DIAGNOSIS — I471 Supraventricular tachycardia: Secondary | ICD-10-CM | POA: Insufficient documentation

## 2020-02-29 NOTE — Patient Instructions (Addendum)
  Blood work, urine tests and an xray was ordered.     Flu immunization administered today.     Medications changes include :   none  Your prescription(s) have been submitted to your pharmacy. Please take as directed and contact our office if you believe you are having problem(s) with the medication(s).     Please followup in 1 year

## 2020-02-29 NOTE — Assessment & Plan Note (Signed)
Chronic Following with cardiology Controlled with metoprolol

## 2020-02-29 NOTE — Progress Notes (Signed)
Subjective:    Patient ID: Elizabeth Mills, female    DOB: Mar 26, 1945, 74 y.o.   MRN: [id number]  HPI The patient is here for follow up of their chronic medical problems, including hypothyroidism, asthma, hyperlipidemia  She saw cardio recently- they would like a copy of her labs from today.   Right hip pain x 1 year - worse in past few months - her right hip hurts if she walks a lot.  She would feel pulling sensation almost like something was tearing in her groin.  She has pain at night and it will wake her up no matter what side she sleeps on.    She was walking 2 miles, but is not able to walk as much now due to the hip pain.    Medications and allergies reviewed with patient and updated if appropriate.  Patient Active Problem List   Diagnosis Date Noted  . PSVT (paroxysmal supraventricular tachycardia) ([facility]) 02/29/2020  . Occipital neuralgia of right side 04/27/2019  . Left elbow pain 04/05/2019  . Sore throat 11/17/2017  . Hyperglycemia 10/28/2017  . Splitting of nail 10/28/2017  . Claudication ([facility]) 01/15/2017  . Nephrolithiasis 09/22/2016  . Osteopenia 06/27/2015  . Amblyopia of right eye 03/07/2014  . Nuclear sclerosis of both eyes 03/07/2014  . Hip bursitis 01/25/2014  . Rapid palpitations 07/28/2012  . Alopecia 07/28/2012  . Nuclear cataract 02/04/2012  . Asthma   . Allergic rhinitis   . Hypercholesterolemia   . Back pain   . Colitis   . Hypothyroidism     Current Outpatient Medications on File Prior to Visit  Medication Sig Dispense Refill  . estradiol (ESTRACE) 0.1 MG/GM vaginal cream Place 1 Applicatorful vaginally once a week.    . fluticasone (FLONASE) 50 MCG/ACT nasal spray PLACE 2 SPRAYS INTO BOTH NOSTRILS DAILY AS NEEDED FOR ALLERGIES OR RHINITIS (ALLERGIES). 16 mL 1  . Fluticasone-Salmeterol (ADVAIR) 250-50 MCG/DOSE AEPB Inhale 1 puff into the lungs 2 (two) times daily.    Marland Kitchen loratadine (CLARITIN) 10 MG tablet Take 10 mg by mouth daily as  needed for allergies (allergies).    . metoprolol succinate (TOPROL-XL) 25 MG 24 hr tablet TAKE 0.5 TABLET BY MOUTH DAILY, MAY ALSO TAKE AN EXTRA 0.5 TABLET DAILY AS NEEDED FOR PALPITATIONS 90 tablet 3  . PROAIR HFA 108 (90 Base) MCG/ACT inhaler INHALE 1-2 PUFFS INTO THE LUNGS EVERY 6 (SIX) HOURS AS NEEDED FOR WHEEZING OR SHORTNESS OF BREATH. 17 g 1  . Probiotic Product (DIGESTIVE ADV PREBIOT+PROBIOT) CHEW Patient take 2 gummies by mouth daily    . rosuvastatin (CRESTOR) 40 MG tablet Take 40 mg by mouth 3 (three) times a week.    Marland Kitchen SYNTHROID 75 MCG tablet TAKE 1 TABLET BY MOUTH  DAILY BEFORE BREAKFAST 90 tablet 3  . VITAMIN D, CHOLECALCIFEROL, PO Patient take 2 gummies (2,000 units total) by mouth daily     No current facility-administered medications on file prior to visit.    Past Medical History:  Diagnosis Date  . Allergic rhinitis, cause unspecified   . Asthma   . Back pain   . Colitis   . Diverticulitis   . Generalized headaches   . GERD (gastroesophageal reflux disease)   . Hypercholesterolemia   . Hypothyroidism   . LVH (left ventricular hypertrophy)   . PSVT (paroxysmal supraventricular tachycardia) ([facility])   . Statin intolerance     Past Surgical History:  Procedure Laterality Date  . ABDOMINAL HYSTERECTOMY  1983  .  APPENDECTOMY  1955  . BACK SURGERY    . BREAST BIOPSY  1982  . BREAST EXCISIONAL BIOPSY Bilateral   . COLONOSCOPY    . EYE SURGERY  11/14/2009, 1990   blocked tear duct repair  . TONSILLECTOMY  1953    Social History   Socioeconomic History  . Marital status: Married    Spouse name: Not on file  . Number of children: 2  . Years of education: 76  . Highest education level: Not on file  Occupational History  . Occupation: Homemaker  Tobacco Use  . Smoking status: Never Smoker  . Smokeless tobacco: Never Used  . 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 Vaping Use: Never used  Substance and Sexual Activity  . Alcohol  use: No  . Drug use: No  . Sexual activity: Not Currently  Other Topics Concern  . Not on file  Social History Narrative   Regular exercise-yes, walk 2-3 times a week   Caffeine Use-no   Social Determinants of Health   Financial Resource Strain: Not on file  Food Insecurity: Not on file  Transportation Needs: Not on file  Physical Activity: Not on file  Stress: Not on file  Social Connections: Not on file    Family History  Problem Relation Age of Onset  . Cancer Mother        Kidney and Ovarian Cancer  . Arthritis Mother   . Hyperlipidemia Mother   . Arthritis Father   . Hyperlipidemia Father   . Breast cancer Neg Hx     Review of Systems  Constitutional: Negative for fever.  HENT: Positive for postnasal drip and sinus pressure. Negative for congestion, ear pain and sore throat.   Respiratory: Positive for wheezing (occ). Negative for cough and shortness of breath.   Cardiovascular: Negative for chest pain, palpitations and leg swelling.  Genitourinary: Positive for frequency (wtih incomplete bladder emptying). Negative for dysuria.  Neurological: Positive for dizziness. Negative for headaches.       Objective:   Vitals:   03/01/20 0908  BP: 110/72  Pulse: 64  Temp: 98 F (36.7 C)  SpO2: 96%   BP Readings from Last 3 Encounters:  03/01/20 110/72  02/24/20 104/80  04/29/19 110/70   Wt Readings from Last 3 Encounters:  03/01/20 132 lb (59.9 kg)  02/24/20 135 lb 9.6 oz (61.5 kg)  04/29/19 134 lb (60.8 kg)   Body mass index is 23.38 kg/m.   Physical Exam    Constitutional: Appears well-developed and well-nourished. No distress.  HENT:  Head: Normocephalic and atraumatic.  Neck: Neck supple. No tracheal deviation present. No thyromegaly present.  No cervical lymphadenopathy Cardiovascular: Normal rate, regular rhythm and normal heart sounds.   No murmur heard. No carotid bruit .  No edema Pulmonary/Chest: Effort normal and breath sounds normal. No  respiratory distress. No has no wheezes. No rales. Musculoskeletal: Some tenderness lateral hip.  Some pain in right groin with standing and walking initially Skin: Skin is warm and dry. Not diaphoretic.  Psychiatric: Normal mood and affect. Behavior is normal.      Assessment & Plan:    See Problem List for Assessment and Plan of chronic medical problems.    This visit occurred during the SARS-CoV-2 public health emergency.  Safety protocols were in place, including screening questions prior to the visit, additional usage of staff PPE, and extensive cleaning of exam room while observing appropriate contact time as indicated for  disinfecting solutions.

## 2020-03-01 ENCOUNTER — Ambulatory Visit: Payer: Medicare Other | Admitting: Internal Medicine

## 2020-03-01 ENCOUNTER — Other Ambulatory Visit: Payer: Self-pay | Admitting: Internal Medicine

## 2020-03-01 ENCOUNTER — Other Ambulatory Visit: Payer: Self-pay

## 2020-03-01 ENCOUNTER — Encounter: Payer: Self-pay | Admitting: Internal Medicine

## 2020-03-01 ENCOUNTER — Ambulatory Visit (INDEPENDENT_AMBULATORY_CARE_PROVIDER_SITE_OTHER): Payer: Medicare Other

## 2020-03-01 VITALS — BP 110/72 | HR 64 | Temp 98.0°F | Ht 63.0 in | Wt 132.0 lb

## 2020-03-01 DIAGNOSIS — E039 Hypothyroidism, unspecified: Secondary | ICD-10-CM

## 2020-03-01 DIAGNOSIS — R350 Frequency of micturition: Secondary | ICD-10-CM | POA: Insufficient documentation

## 2020-03-01 DIAGNOSIS — M25551 Pain in right hip: Secondary | ICD-10-CM | POA: Insufficient documentation

## 2020-03-01 DIAGNOSIS — E7800 Pure hypercholesterolemia, unspecified: Secondary | ICD-10-CM | POA: Diagnosis not present

## 2020-03-01 DIAGNOSIS — Z23 Encounter for immunization: Secondary | ICD-10-CM

## 2020-03-01 DIAGNOSIS — I471 Supraventricular tachycardia: Secondary | ICD-10-CM | POA: Diagnosis not present

## 2020-03-01 DIAGNOSIS — J4520 Mild intermittent asthma, uncomplicated: Secondary | ICD-10-CM

## 2020-03-01 LAB — CBC WITH DIFFERENTIAL/PLATELET
Basophils Absolute: 0 10*3/uL (ref 0.0–0.1)
Basophils Relative: 0.7 % (ref 0.0–3.0)
Eosinophils Absolute: 0.2 10*3/uL (ref 0.0–0.7)
Eosinophils Relative: 4.9 % (ref 0.0–5.0)
HCT: 43.4 % (ref 36.0–46.0)
Hemoglobin: 14.9 g/dL (ref 12.0–15.0)
Lymphocytes Relative: 34.8 % (ref 12.0–46.0)
Lymphs Abs: 1.5 10*3/uL (ref 0.7–4.0)
MCHC: 34.5 g/dL (ref 30.0–36.0)
MCV: 88.8 fl (ref 78.0–100.0)
Monocytes Absolute: 0.5 10*3/uL (ref 0.1–1.0)
Monocytes Relative: 10.3 % (ref 3.0–12.0)
Neutro Abs: 2.2 10*3/uL (ref 1.4–7.7)
Neutrophils Relative %: 49.3 % (ref 43.0–77.0)
Platelets: 282 10*3/uL (ref 150.0–400.0)
RBC: 4.88 Mil/uL (ref 3.87–5.11)
RDW: 13.3 % (ref 11.5–15.5)
WBC: 4.4 10*3/uL (ref 4.0–10.5)

## 2020-03-01 LAB — URINALYSIS, ROUTINE W REFLEX MICROSCOPIC
Bilirubin Urine: NEGATIVE
Hgb urine dipstick: NEGATIVE
Ketones, ur: NEGATIVE
Nitrite: NEGATIVE
RBC / HPF: NONE SEEN (ref 0–?)
Specific Gravity, Urine: 1.025 (ref 1.000–1.030)
Total Protein, Urine: NEGATIVE
Urine Glucose: NEGATIVE
Urobilinogen, UA: 0.2 (ref 0.0–1.0)
pH: 7 (ref 5.0–8.0)

## 2020-03-01 LAB — COMPREHENSIVE METABOLIC PANEL
ALT: 20 U/L (ref 0–35)
AST: 22 U/L (ref 0–37)
Albumin: 4 g/dL (ref 3.5–5.2)
Alkaline Phosphatase: 44 U/L (ref 39–117)
BUN: 14 mg/dL (ref 6–23)
CO2: 32 mEq/L (ref 19–32)
Calcium: 9.3 mg/dL (ref 8.4–10.5)
Chloride: 101 mEq/L (ref 96–112)
Creatinine, Ser: 0.62 mg/dL (ref 0.40–1.20)
GFR: 87.75 mL/min (ref >60.00)
Glucose, Bld: 93 mg/dL (ref 70–99)
Potassium: 3.8 mEq/L (ref 3.5–5.1)
Sodium: 138 mEq/L (ref 135–145)
Total Bilirubin: 0.7 mg/dL (ref 0.2–1.2)
Total Protein: 7.5 g/dL (ref 6.0–8.3)

## 2020-03-01 LAB — TSH: TSH: 0.16 u[IU]/mL — ABNORMAL LOW (ref 0.35–4.50)

## 2020-03-01 LAB — LIPID PANEL
Cholesterol: 231 mg/dL — ABNORMAL HIGH (ref 0–200)
HDL: 78.2 mg/dL (ref >39.00)
LDL Cholesterol: 130 mg/dL — ABNORMAL HIGH (ref 0–99)
NonHDL: 153.15
Total CHOL/HDL Ratio: 3
Triglycerides: 116 mg/dL (ref 0.0–149.0)
VLDL: 23.2 mg/dL (ref 0.0–40.0)

## 2020-03-01 NOTE — Assessment & Plan Note (Signed)
Chronic Controlled Continue Advair 250-50 twice daily and ProAir rescue inhaler as needed

## 2020-03-01 NOTE — Assessment & Plan Note (Signed)
Chronic  Clinically euthyroid Currently taking Synthroid 75 mcg daily Check tsh  Titrate med dose if needed 

## 2020-03-01 NOTE — Assessment & Plan Note (Signed)
Acute ?  UTI Check urinalysis, urine culture

## 2020-03-01 NOTE — Assessment & Plan Note (Signed)
Chronic Check lipid panel, CMP, TSH Continue Crestor 40 mg 3 times a week Regular exercise and healthy diet encouraged

## 2020-03-01 NOTE — Assessment & Plan Note (Signed)
New problem Has had some right hip pain for the past year-worse in the few past weeks Possible bursitis, likely osteoarthritis X-ray today Advised that she consider seeing orthopedics

## 2020-03-02 LAB — URINE CULTURE

## 2020-03-03 ENCOUNTER — Other Ambulatory Visit: Payer: Self-pay | Admitting: Internal Medicine

## 2020-03-03 MED ORDER — SYNTHROID 75 MCG PO TABS
ORAL_TABLET | ORAL | 3 refills | Status: DC
Start: 2020-03-03 — End: 2021-01-24

## 2020-04-26 ENCOUNTER — Other Ambulatory Visit: Payer: Self-pay | Admitting: Physician Assistant

## 2020-07-09 ENCOUNTER — Other Ambulatory Visit: Payer: Self-pay | Admitting: Internal Medicine

## 2020-07-09 DIAGNOSIS — Z1231 Encounter for screening mammogram for malignant neoplasm of breast: Secondary | ICD-10-CM

## 2020-07-25 ENCOUNTER — Telehealth (INDEPENDENT_AMBULATORY_CARE_PROVIDER_SITE_OTHER): Payer: Medicare Other | Admitting: Internal Medicine

## 2020-07-25 ENCOUNTER — Encounter: Payer: Self-pay | Admitting: Internal Medicine

## 2020-07-25 DIAGNOSIS — J069 Acute upper respiratory infection, unspecified: Secondary | ICD-10-CM | POA: Insufficient documentation

## 2020-07-25 MED ORDER — AMOXICILLIN-POT CLAVULANATE 875-125 MG PO TABS: 1.0000 | ORAL_TABLET | Freq: Two times a day (BID) | ORAL | 0 refills | Status: DC

## 2020-07-25 NOTE — Assessment & Plan Note (Signed)
Acute Symptoms started a few days ago after excessive pollen exposure Discussed the possibility of COVID and I did advise that she get tested-ideally with a home test today so that we get the results quickly She states she is feeling better since the fever has broken and the wheezing is not that bad.  No shortness of breath. Concern for the possibility of bacterial infection-hard to tell by televisit Will start Augmentin 875-125 mg twice daily x10 days Continue Mucinex Discussed over-the-counter cold medication she can take for symptom relief Increased rest and fluids Call if symptoms worsen or do not improve

## 2020-07-25 NOTE — Progress Notes (Signed)
Virtual Visit via Video Note  I connected with Elizabeth Mills on 07/25/20 at  1:40 PM EDT by a video enabled telemedicine application and verified that I am speaking with the correct person using two identifiers.   I discussed the limitations of evaluation and management by telemedicine and the availability of in person appointments. The patient expressed understanding and agreed to proceed.  Present for the visit:  Myself, Dr Cheryll Cockayne, Robin Searing.  The patient is currently at home and I am in the office.    No referring provider.    History of Present Illness: This is an acute visit for fever, sore throat, cough.  Symptoms started Sunday.  Saturday she was blowing a lot of pollen off deck.    She has been experiencing hoarseness, wheezing,  and coughing up mucus that was yellow -green.  Fever earlier today 100.8, but now 99.1.  She had a little back pain when she had a fever, but that has improved.  She feels tired and a little weak.  She is taking mucinex.     Had pfizer vaccine.  She denies any known COVID exposure.    Review of Systems  Constitutional: Positive for fever and malaise/fatigue (weak).  HENT: Positive for sore throat (hoarseness). Negative for congestion, ear pain and sinus pain.        PND, glands in neck feel swollen  Respiratory: Positive for cough, sputum production and wheezing. Negative for shortness of breath.   Gastrointestinal: Negative for diarrhea, nausea and vomiting.  Musculoskeletal: Positive for back pain (from fever - achiness). Negative for myalgias.  Neurological: Negative for dizziness and headaches.      Social History   Socioeconomic History  . Marital status: Married    Spouse name: Not on file  . Number of children: 2  . Years of education: 30  . Highest education level: Not on file  Occupational History  . Occupation: Homemaker  Tobacco Use  . Smoking status: Never Smoker  . Smokeless tobacco: Never Used  .  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 Vaping Use: Never used  Substance and Sexual Activity  . Alcohol use: No  . Drug use: No  . Sexual activity: Not Currently  Other Topics Concern  . Not on file  Social History Narrative   Regular exercise-yes, walk 2-3 times a week   Caffeine Use-no   Social Determinants of Health   Financial Resource Strain: Not on file  Food Insecurity: Not on file  Transportation Needs: Not on file  Physical Activity: Not on file  Stress: Not on file  Social Connections: Not on file     Observations/Objective: Appears well in NAD Breathing normally Skin appears warm and dry  Assessment and Plan:  See Problem List for Assessment and Plan of chronic medical problems.   Follow Up Instructions:    I discussed the assessment and treatment plan with the patient. The patient was provided an opportunity to ask questions and all were answered. The patient agreed with the plan and demonstrated an understanding of the instructions.   The patient was advised to call back or seek an in-person evaluation if the symptoms worsen or if the condition fails to improve as anticipated.    Pincus Sanes, MD

## 2020-07-26 ENCOUNTER — Other Ambulatory Visit: Payer: Self-pay

## 2020-07-26 ENCOUNTER — Telehealth: Payer: Self-pay | Admitting: Internal Medicine

## 2020-07-26 MED ORDER — PROAIR HFA 108 (90 BASE) MCG/ACT IN AERS: 1.0000 | INHALATION_SPRAY | Freq: Four times a day (QID) | RESPIRATORY_TRACT | 1 refills | Status: DC | PRN

## 2020-07-26 NOTE — Telephone Encounter (Signed)
Sent in today 

## 2020-07-26 NOTE — Telephone Encounter (Signed)
PROAIR HFA 108 (90 Base) MCG/ACT inhaler [facility] [place], [place] - [address] [place], [address] Phone:  [phone]  Fax:  [phone]     last seen- 05.04.22

## 2020-08-28 ENCOUNTER — Ambulatory Visit
Admission: RE | Admit: 2020-08-28 | Discharge: 2020-08-28 | Disposition: A | Discharge status: Home / Self Care | Payer: Medicare Other | Source: Ambulatory Visit | Attending: Internal Medicine | Admitting: Internal Medicine

## 2020-08-28 ENCOUNTER — Other Ambulatory Visit: Payer: Self-pay

## 2020-08-28 DIAGNOSIS — Z1231 Encounter for screening mammogram for malignant neoplasm of breast: Secondary | ICD-10-CM

## 2020-09-26 ENCOUNTER — Other Ambulatory Visit: Payer: Self-pay | Admitting: Internal Medicine

## 2020-10-19 ENCOUNTER — Other Ambulatory Visit: Payer: Self-pay | Admitting: Internal Medicine

## 2020-10-30 ENCOUNTER — Ambulatory Visit: Payer: Medicare Other

## 2020-11-12 ENCOUNTER — Ambulatory Visit (INDEPENDENT_AMBULATORY_CARE_PROVIDER_SITE_OTHER): Payer: Medicare Other

## 2020-11-12 ENCOUNTER — Other Ambulatory Visit: Payer: Self-pay | Admitting: Internal Medicine

## 2020-11-12 ENCOUNTER — Other Ambulatory Visit: Payer: Self-pay

## 2020-11-12 ENCOUNTER — Other Ambulatory Visit (INDEPENDENT_AMBULATORY_CARE_PROVIDER_SITE_OTHER): Payer: Medicare Other

## 2020-11-12 VITALS — BP 118/70 | HR 65 | Temp 97.8°F | Ht 62.0 in | Wt 131.8 lb

## 2020-11-12 DIAGNOSIS — E039 Hypothyroidism, unspecified: Secondary | ICD-10-CM

## 2020-11-12 DIAGNOSIS — Z0000 Encounter for general adult medical examination without abnormal findings: Secondary | ICD-10-CM

## 2020-11-12 DIAGNOSIS — R300 Dysuria: Secondary | ICD-10-CM

## 2020-11-12 LAB — URINALYSIS, ROUTINE W REFLEX MICROSCOPIC
Bilirubin Urine: NEGATIVE
Hgb urine dipstick: NEGATIVE
Ketones, ur: NEGATIVE
Leukocytes,Ua: NEGATIVE
Nitrite: NEGATIVE
RBC / HPF: NONE SEEN (ref 0–?)
Specific Gravity, Urine: 1.02 (ref 1.000–1.030)
Total Protein, Urine: NEGATIVE
Urine Glucose: NEGATIVE
Urobilinogen, UA: 0.2 (ref 0.0–1.0)
pH: 6 (ref 5.0–8.0)

## 2020-11-12 LAB — TSH: TSH: 0.64 u[IU]/mL (ref 0.35–5.50)

## 2020-11-12 NOTE — Patient Instructions (Addendum)
Elizabeth Mills , Thank you for taking time to come for your Medicare Wellness Visit. I appreciate your ongoing commitment to your health goals. Please review the following plan we discussed and let me know if I can assist you in the future.   Screening recommendations/referrals: Colonoscopy: 02/23/2017; due every 10 years Mammogram: 08/28/2020; due every year Bone Density: 06/22/2015; due every 2 years Recommended yearly ophthalmology/optometry visit for glaucoma screening and checkup Recommended yearly dental visit for hygiene and checkup  Vaccinations: Influenza vaccine: 03/01/2020 Pneumococcal vaccine: 01/25/2014, 03/22/2015 Tdap vaccine: 12/30/2013; due every 10 years Shingles vaccine: Please call your insurance company to determine your out of pocket expense for the Shingrix vaccine. You may receive this vaccine at your local pharmacy.   Covid-19: 04/18/2019, 05/09/2019, 02/13/2020, 09/25/2020  Advanced directives: Please bring a copy of your health care power of attorney and living will to the office at your convenience.  Conditions/risks identified: Yes; Client understands the importance of follow-up with providers by attending scheduled visits and discussed goals to eat healthier, increase physical activity, exercise the brain, socialize more, get enough sleep and make time for laughter.  Next appointment: 12/01/2021 at 10:30 am   Preventive Care 65 Years and Older, Female Preventive care refers to lifestyle choices and visits with your health care provider that can promote health and wellness. What does preventive care include? A yearly physical exam. This is also called an annual well check. Dental exams once or twice a year. Routine eye exams. Ask your health care provider how often you should have your eyes checked. Personal lifestyle choices, including: Daily care of your teeth and gums. Regular physical activity. Eating a healthy diet. Avoiding tobacco and drug use. Limiting alcohol  use. Practicing safe sex. Taking low-dose aspirin every day. Taking vitamin and mineral supplements as recommended by your health care provider. What happens during an annual well check? The services and screenings done by your health care provider during your annual well check will depend on your age, overall health, lifestyle risk factors, and family history of disease. Counseling  Your health care provider may ask you questions about your: Alcohol use. Tobacco use. Drug use. Emotional well-being. Home and relationship well-being. Sexual activity. Eating habits. History of falls. Memory and ability to understand (cognition). Work and work Astronomer. Reproductive health. Screening  You may have the following tests or measurements: Height, weight, and BMI. Blood pressure. Lipid and cholesterol levels. These may be checked every 5 years, or more frequently if you are over 23 years old. Skin check. Lung cancer screening. You may have this screening every year starting at age 18 if you have a 30-pack-year history of smoking and currently smoke or have quit within the past 15 years. Fecal occult blood test (FOBT) of the stool. You may have this test every year starting at age 81. Flexible sigmoidoscopy or colonoscopy. You may have a sigmoidoscopy every 5 years or a colonoscopy every 10 years starting at age 22. Hepatitis C blood test. Hepatitis B blood test. Sexually transmitted disease (STD) testing. Diabetes screening. This is done by checking your blood sugar (glucose) after you have not eaten for a while (fasting). You may have this done every 1-3 years. Bone density scan. This is done to screen for osteoporosis. You may have this done starting at age 37. Mammogram. This may be done every 1-2 years. Talk to your health care provider about how often you should have regular mammograms. Talk with your health care provider about your test results,  treatment options, and if necessary,  the need for more tests. Vaccines  Your health care provider may recommend certain vaccines, such as: Influenza vaccine. This is recommended every year. Tetanus, diphtheria, and acellular pertussis (Tdap, Td) vaccine. You may need a Td booster every 10 years. Zoster vaccine. You may need this after age 66. Pneumococcal 13-valent conjugate (PCV13) vaccine. One dose is recommended after age 51. Pneumococcal polysaccharide (PPSV23) vaccine. One dose is recommended after age 20. Talk to your health care provider about which screenings and vaccines you need and how often you need them. This information is not intended to replace advice given to you by your health care provider. Make sure you discuss any questions you have with your health care provider. Document Released: 04/06/2015 Document Revised: 11/28/2015 Document Reviewed: 01/09/2015 Elsevier Interactive Patient Education  2017 [place] Prevention in the Home Falls can cause injuries. They can happen to people of all ages. There are many things you can do to make your home safe and to help prevent falls. What can I do on the outside of my home? Regularly fix the edges of walkways and driveways and fix any cracks. Remove anything that might make you trip as you walk through a door, such as a raised step or threshold. Trim any bushes or trees on the path to your home. Use bright outdoor lighting. Clear any walking paths of anything that might make someone trip, such as rocks or tools. Regularly check to see if handrails are loose or broken. Make sure that both sides of any steps have handrails. Any raised decks and porches should have guardrails on the edges. Have any leaves, snow, or ice cleared regularly. Use sand or salt on walking paths during winter. Clean up any spills in your garage right away. This includes oil or grease spills. What can I do in the bathroom? Use night lights. Install grab bars by the toilet and in the  tub and shower. Do not use towel bars as grab bars. Use non-skid mats or decals in the tub or shower. If you need to sit down in the shower, use a plastic, non-slip stool. Keep the floor dry. Clean up any water that spills on the floor as soon as it happens. Remove soap buildup in the tub or shower regularly. Attach bath mats securely with double-sided non-slip rug tape. Do not have throw rugs and other things on the floor that can make you trip. What can I do in the bedroom? Use night lights. Make sure that you have a light by your bed that is easy to reach. Do not use any sheets or blankets that are too big for your bed. They should not hang down onto the floor. Have a firm chair that has side arms. You can use this for support while you get dressed. Do not have throw rugs and other things on the floor that can make you trip. What can I do in the kitchen? Clean up any spills right away. Avoid walking on wet floors. Keep items that you use a lot in easy-to-reach places. If you need to reach something above you, use a strong step stool that has a grab bar. Keep electrical cords out of the way. Do not use floor polish or wax that makes floors slippery. If you must use wax, use non-skid floor wax. Do not have throw rugs and other things on the floor that can make you trip. What can I do with my stairs? Do  not leave any items on the stairs. Make sure that there are handrails on both sides of the stairs and use them. Fix handrails that are broken or loose. Make sure that handrails are as long as the stairways. Check any carpeting to make sure that it is firmly attached to the stairs. Fix any carpet that is loose or worn. Avoid having throw rugs at the top or bottom of the stairs. If you do have throw rugs, attach them to the floor with carpet tape. Make sure that you have a light switch at the top of the stairs and the bottom of the stairs. If you do not have them, ask someone to add them for  you. What else can I do to help prevent falls? Wear shoes that: Do not have high heels. Have rubber bottoms. Are comfortable and fit you well. Are closed at the toe. Do not wear sandals. If you use a stepladder: Make sure that it is fully opened. Do not climb a closed stepladder. Make sure that both sides of the stepladder are locked into place. Ask someone to hold it for you, if possible. Clearly mark and make sure that you can see: Any grab bars or handrails. First and last steps. Where the edge of each step is. Use tools that help you move around (mobility aids) if they are needed. These include: Canes. Walkers. Scooters. Crutches. Turn on the lights when you go into a dark area. Replace any light bulbs as soon as they burn out. Set up your furniture so you have a clear path. Avoid moving your furniture around. If any of your floors are uneven, fix them. If there are any pets around you, be aware of where they are. Review your medicines with your doctor. Some medicines can make you feel dizzy. This can increase your chance of falling. Ask your doctor what other things that you can do to help prevent falls. This information is not intended to replace advice given to you by your health care provider. Make sure you discuss any questions you have with your health care provider. Document Released: 01/04/2009 Document Revised: 08/16/2015 Document Reviewed: 04/14/2014 Elsevier Interactive Patient Education  2017 [employer].

## 2020-11-12 NOTE — Progress Notes (Signed)
Subjective:   Erla Bacchi is a 75 y.o. female who presents for Medicare Annual (Subsequent) preventive examination.  Review of Systems     Cardiac Risk Factors include: advanced age (>8men, >52 women);dyslipidemia     Objective:    Today's Vitals   11/12/20 0943  BP: 118/70  Pulse: 65  Temp: 97.8 F (36.6 C)  SpO2: 96%  Weight: 131 lb 12.8 oz (59.8 kg)  Height: 5\' 2"  (1.575 m)  PainSc: 0-No pain   Body mass index is 24.11 kg/m.  Advanced Directives 11/12/2020 08/27/2017 08/28/2015  Does Patient Have a Medical Advance Directive? Yes Yes Yes  Type of Advance Directive Living will;Healthcare Power of 10/28/2015 Power of [place];Living will -  Does patient want to make changes to medical advance directive? No - Patient declined - -  Copy of Healthcare Power of Attorney in Chart? No - copy requested No - copy requested -    Current Medications (verified) Outpatient Encounter Medications as of 11/12/2020  Medication Sig   Biotin 5000 MCG TABS Take 1 capsule by mouth daily.   estradiol (ESTRACE) 0.1 MG/GM vaginal cream Place 1 Applicatorful vaginally once a week.   fluticasone (FLONASE) 50 MCG/ACT nasal spray PLACE 2 SPRAYS INTO BOTH NOSTRILS DAILY AS NEEDED FOR ALLERGIES OR RHINITIS (ALLERGIES).   Fluticasone-Salmeterol (ADVAIR) 250-50 MCG/DOSE AEPB Inhale 1 puff into the lungs 2 (two) times daily.   loratadine (CLARITIN) 10 MG tablet Take 10 mg by mouth daily as needed for allergies (allergies).   metoprolol succinate (TOPROL-XL) 25 MG 24 hr tablet TAKE ONE-HALF TABLET BY  MOUTH DAILY , MAY ALSO TAKE AN EXTRA ONE-HALF TABLET BY MOUTH DAILY AS NEEDED FOR  PALPITATIONS   PROAIR HFA 108 (90 Base) MCG/ACT inhaler USE 1 TO 2 INHALATIONS BY  MOUTH EVERY 6 HOURS AS  NEEDED FOR WHEEZING OR  SHORTNESS OF BREATH   rosuvastatin (CRESTOR) 40 MG tablet Take 40 mg by mouth 3 (three) times a week.   SYNTHROID 75 MCG tablet TAKE 1 TABLET BY MOUTH  DAILY BEFORE BREAKFAST 6 DAYS  A WEEK ONLY   VITAMIN D, CHOLECALCIFEROL, PO Patient take 2 gummies (2,000 units total) by mouth daily   amoxicillin-clavulanate (AUGMENTIN) 875-125 MG tablet Take 1 tablet by mouth 2 (two) times daily. (Patient not taking: Reported on 11/12/2020)   Probiotic Product (DIGESTIVE ADV PREBIOT+PROBIOT) CHEW Patient take 2 gummies by mouth daily (Patient not taking: Reported on 11/12/2020)   No facility-administered encounter medications on file as of 11/12/2020.    Allergies (verified) Crestor [rosuvastatin calcium], Lipitor [atorvastatin calcium], Phenergan [promethazine hcl], Pravachol, Promethazine, Promethazine hcl, Sulfa antibiotics, Sulfamethoxazole, Zetia [ezetimibe], and Zocor [simvastatin - high dose]   History: Past Medical History:  Diagnosis Date   Allergic rhinitis, cause unspecified    Asthma    Back pain    Colitis    Diverticulitis    Generalized headaches    GERD (gastroesophageal reflux disease)    Hypercholesterolemia    Hypothyroidism    LVH (left ventricular hypertrophy)    PSVT (paroxysmal supraventricular tachycardia) (HCC)    Statin intolerance    Past Surgical History:  Procedure Laterality Date   ABDOMINAL HYSTERECTOMY  1983   APPENDECTOMY  1955   BACK SURGERY     BREAST BIOPSY  1982   BREAST EXCISIONAL BIOPSY Bilateral    COLONOSCOPY     EYE SURGERY  11/14/2009, 1990   blocked tear duct repair   TONSILLECTOMY  1953   Family History  Problem  Relation Age of Onset   Cancer Mother        Kidney and Ovarian Cancer   Arthritis Mother    Hyperlipidemia Mother    Arthritis Father    Hyperlipidemia Father    Breast cancer Neg Hx    Social History   Socioeconomic History   Marital status: Married    Spouse name: Not on file   Number of children: 2   Years of education: 13   Highest education level: Not on file  Occupational History   Occupation: Homemaker  Tobacco Use   Smoking status: Never   Smokeless tobacco: Never   Tobacco comments:     married, lives with spouse - retired  Building services engineerVaping Use   Vaping Use: Never used  Substance and Sexual Activity   Alcohol use: No   Drug use: No   Sexual activity: Not Currently  Other Topics Concern   Not on file  Social History Narrative   Regular exercise-yes, walk 2-3 times a week   Caffeine Use-no   Social Determinants of Health   Financial Resource Strain: Low Risk    Difficulty of Paying Living Expenses: Not hard at all  Food Insecurity: No Food Insecurity   Worried About Programme researcher, broadcasting/film/videounning Out of Food in the Last Year: Never true   Baristaan Out of Food in the Last Year: Never true  Transportation Needs: No Transportation Needs   Lack of Transportation (Medical): No   Lack of Transportation (Non-Medical): No  Physical Activity: Sufficiently Active   Days of Exercise per Week: 5 days   Minutes of Exercise per Session: 30 min  Stress: No Stress Concern Present   Feeling of Stress : Not at all  Social Connections: Socially Integrated   Frequency of Communication with Friends and Family: More than three times a week   Frequency of Social Gatherings with Friends and Family: More than three times a week   Attends Religious Services: More than 4 times per year   Active Member of [employer] FinancialClubs or Organizations: Yes   Attends Engineer, structuralClub or Organization Meetings: More than 4 times per year   Marital Status: Married    Tobacco Counseling Counseling given: Not Answered Tobacco comments: married, lives with spouse - retired   Clinical Intake:  Pre-visit preparation completed: Yes  Pain : No/denies pain Pain Score: 0-No pain     BMI - recorded: 24.11 Nutritional Status: BMI of 19-24  Normal Nutritional Risks: None Diabetes: No  How often do you need to have someone help you when you read instructions, pamphlets, or other written materials from your doctor or pharmacy?: 1 - Never What is the last grade level you completed in school?: HSG; 1 year of college  Diabetic?no  Interpreter Needed?:  No  Information entered by :: Susie CassetteShenika Nithila Sumners, LPN   Activities of Daily Living In your present state of health, do you have any difficulty performing the following activities: 11/12/2020 03/01/2020  Hearing? N N  Vision? N N  Difficulty concentrating or making decisions? N N  Walking or climbing stairs? N N  Dressing or bathing? N N  Doing errands, shopping? N N  Preparing Food and eating ? N -  Using the Toilet? N -  In the past six months, have you accidently leaked urine? N -  Do you have problems with loss of bowel control? N -  Managing your Medications? N -  Managing your Finances? N -  Housekeeping or managing your Housekeeping? N -  Some recent data  might be hidden    Patient Care Team: Pincus Sanes, MD as PCP - General (Internal Medicine) Lars Masson, MD (Inactive) as PCP - Cardiology (Cardiology) Cassell Clement, MD (Cardiology) Gretta Cool, MD (Inactive) (Obstetrics and Gynecology) Bernette Redbird, MD (Gastroenterology)  Indicate any recent Medical Services you may have received from other than [facility] providers in the past year (date may be approximate).     Assessment:   This is a routine wellness examination for [place].  Hearing/Vision screen No results found.  Dietary issues and exercise activities discussed: Current Exercise Habits: Home exercise routine, Type of exercise: walking, Time (Minutes): 30, Frequency (Times/Week): 5, Weekly Exercise (Minutes/Week): 150, Intensity: Moderate, Exercise limited by: respiratory conditions(s);cardiac condition(s)   Goals Addressed               This Visit's Progress     Patient Stated (pt-stated)        My goal is to stay healthy.      Depression Screen PHQ 2/9 Scores 11/12/2020 03/01/2020 08/27/2017 09/22/2016 09/22/2016 08/28/2015 03/28/2014  PHQ - 2 Score 0 0 1 0 0 0 0  PHQ- 9 Score - - - 0 - - -    Fall Risk Fall Risk  11/12/2020 04/05/2019 08/27/2017 09/22/2016 08/28/2015  Falls in the past year? 1 0  No No No  Number falls in past yr: 0 0 - - -  Injury with Fall? 0 - - - -  Follow up Falls evaluation completed - - - -    FALL RISK PREVENTION PERTAINING TO THE HOME:  Any stairs in or around the home? Yes  If so, are there any without handrails? No  Home free of loose throw rugs in walkways, pet beds, electrical cords, etc? Yes  Adequate lighting in your home to reduce risk of falls? Yes   ASSISTIVE DEVICES UTILIZED TO PREVENT FALLS:  Life alert? No  Use of a cane, walker or w/c? No  Grab bars in the bathroom? Yes  Shower chair or bench in shower? Yes  Elevated toilet seat or a handicapped toilet? No   TIMED UP AND GO:  Was the test performed? Yes .  Length of time to ambulate 10 feet: 6 sec.   Gait steady and fast without use of assistive device  Cognitive Function: Normal cognitive status assessed by direct observation by this Nurse Health Advisor. No abnormalities found.          Immunizations Immunization History  Administered Date(s) Administered   Fluad Quad(high Dose 65+) 03/01/2020   Influenza Split 12/23/2011   Influenza, High Dose Seasonal PF 02/18/2011, 12/26/2016, 02/24/2018   Influenza,inj,Quad PF,6+ Mos 03/10/2013, 12/30/2013, 03/03/2016, 12/23/2018   PFIZER(Purple Top)SARS-COV-2 Vaccination 04/18/2019, 05/09/2019, 02/13/2020, 09/25/2020   Pneumococcal Conjugate-13 01/25/2014   Pneumococcal Polysaccharide-23 03/22/2015   Pneumococcal-Unspecified 03/25/2007   Td 03/24/2002   Tdap 12/30/2013    TDAP status: Up to date  Flu Vaccine status: Up to date  Pneumococcal vaccine status: Up to date  Covid-19 vaccine status: Completed vaccines  Qualifies for Shingles Vaccine? Yes   Zostavax completed No   Shingrix Completed?: No.    Education has been provided regarding the importance of this vaccine. Patient has been advised to call insurance company to determine out of pocket expense if they have not yet received this vaccine. Advised may also receive  vaccine at local pharmacy or Health Dept. Verbalized acceptance and understanding.  Screening Tests Health Maintenance  Topic Date Due   Zoster Vaccines- Shingrix (1  of 2) Never done   INFLUENZA VACCINE  10/22/2020   COVID-19 Vaccine (5 - Booster for Pfizer series) 01/26/2021   TETANUS/TDAP  12/31/2023   COLONOSCOPY (Pts 45-63yrs Insurance coverage will need to be confirmed)  02/24/2027   DEXA SCAN  Completed   Hepatitis C Screening  Completed   PNA vac Low Risk Adult  Completed   HPV VACCINES  Aged Out    Health Maintenance  Health Maintenance Due  Topic Date Due   Zoster Vaccines- Shingrix (1 of 2) Never done   INFLUENZA VACCINE  10/22/2020    Colorectal cancer screening: Type of screening: Colonoscopy. Completed 02/23/2017. Repeat every 10 years  Mammogram status: Completed 08/28/2020. Repeat every year  Bone Density status: Completed 06/22/2015. Results reflect: Bone density results: OSTEOPENIA. Repeat every 2 years.  Lung Cancer Screening: (Low Dose CT Chest recommended if Age 22-80 years, 30 pack-year currently smoking OR have quit w/in 15years.) does not qualify.   Lung Cancer Screening Referral: no  Additional Screening:  Hepatitis C Screening: does qualify; Completed yes  Vision Screening: Recommended annual ophthalmology exams for early detection of glaucoma and other disorders of the eye. Is the patient up to date with their annual eye exam?  Yes  Who is the provider or what is the name of the office in which the patient attends annual eye exams? Mia Creek, MD. If pt is not established with a provider, would they like to be referred to a provider to establish care? No .   Dental Screening: Recommended annual dental exams for proper oral hygiene  Community Resource Referral / Chronic Care Management: CRR required this visit?  No   CCM required this visit?  No      Plan:     I have personally reviewed and noted the following in the patient's chart:    Medical and social history Use of alcohol, tobacco or illicit drugs  Current medications and supplements including opioid prescriptions.  Functional ability and status Nutritional status Physical activity Advanced directives List of other physicians Hospitalizations, surgeries, and ER visits in previous 12 months Vitals Screenings to include cognitive, depression, and falls Referrals and appointments  In addition, I have reviewed and discussed with patient certain preventive protocols, quality metrics, and best practice recommendations. A written personalized care plan for preventive services as well as general preventive health recommendations were provided to patient.     Mickeal Needy, [place]   08/19/4130   Nurse Notes: n/a

## 2020-11-14 LAB — CULTURE, URINE COMPREHENSIVE: RESULT:: NO GROWTH

## 2020-11-15 ENCOUNTER — Other Ambulatory Visit: Payer: Self-pay

## 2020-11-15 MED ORDER — ESTRADIOL 0.1 MG/GM VA CREA: 1.0000 | TOPICAL_CREAM | VAGINAL | 2 refills | Status: DC

## 2020-12-04 NOTE — Progress Notes (Signed)
Cardiology Office Note:    Date:  12/05/2020   ID:  Elizabeth Mills, DOB 07-04-1945, MRN [id number]  PCP:  Pincus Sanes, MD  Cardiologist:  None   Referring MD: Pincus Sanes, MD   Chief Complaint  Patient presents with   Follow-up    PSVT    History of Present Illness:    Elizabeth Mills is a 75 y.o. female with a hx of palpitations/PSVT per Dr. Yevonne Pax remote note, HLD (intolerant of higher intensity statins), hypothyroidism, asthma, colitis, GERD, prior LVH, aortic valve sclerosis presents for routine cardiology follow-up.  Has been followed by Lucile Crater but mandated to see a cardiologist on this visit  No complaints.  25-year history of PSVT.  Episodes can last up to 15 minutes.  She tries Valsalva and will use an extra half or whole 25 mg Toprol-XL for episodes.  When episodes occur she feels tight in the chest and has weakness.  Episodes are very infrequent.  Her quality of life is perfect.  She is trying to exercise.  There are no precipitants for PSVT.  Past Medical History:  Diagnosis Date   Allergic rhinitis, cause unspecified    Asthma    Back pain    Colitis    Diverticulitis    Generalized headaches    GERD (gastroesophageal reflux disease)    Hypercholesterolemia    Hypothyroidism    LVH (left ventricular hypertrophy)    PSVT (paroxysmal supraventricular tachycardia) (HCC)    Statin intolerance     Past Surgical History:  Procedure Laterality Date   ABDOMINAL HYSTERECTOMY  1983   APPENDECTOMY  1955   BACK SURGERY     BREAST BIOPSY  1982   BREAST EXCISIONAL BIOPSY Bilateral    COLONOSCOPY     EYE SURGERY  11/14/2009, 1990   blocked tear duct repair   TONSILLECTOMY  1953    Current Medications: Current Meds  Medication Sig   amoxicillin-clavulanate (AUGMENTIN) 875-125 MG tablet Take 1 tablet by mouth 2 (two) times daily.   estradiol (ESTRACE) 0.1 MG/GM vaginal cream Place 1 Applicatorful vaginally once a week.   fluticasone  (FLONASE) 50 MCG/ACT nasal spray PLACE 2 SPRAYS INTO BOTH NOSTRILS DAILY AS NEEDED FOR ALLERGIES OR RHINITIS (ALLERGIES).   Fluticasone-Salmeterol (ADVAIR) 250-50 MCG/DOSE AEPB Inhale 1 puff into the lungs 2 (two) times daily.   loratadine (CLARITIN) 10 MG tablet Take 10 mg by mouth daily as needed for allergies (allergies).   metoprolol succinate (TOPROL-XL) 25 MG 24 hr tablet TAKE ONE-HALF TABLET BY  MOUTH DAILY , MAY ALSO TAKE AN EXTRA ONE-HALF TABLET BY MOUTH DAILY AS NEEDED FOR  PALPITATIONS   PROAIR HFA 108 (90 Base) MCG/ACT inhaler USE 1 TO 2 INHALATIONS BY  MOUTH EVERY 6 HOURS AS  NEEDED FOR WHEEZING OR  SHORTNESS OF BREATH   rosuvastatin (CRESTOR) 40 MG tablet Take 40 mg by mouth 3 (three) times a week.   SYNTHROID 75 MCG tablet TAKE 1 TABLET BY MOUTH  DAILY BEFORE BREAKFAST 6 DAYS A WEEK ONLY   VITAMIN D, CHOLECALCIFEROL, PO Patient take 2 gummies (2,000 units total) by mouth daily     Allergies:   Crestor [rosuvastatin calcium], Lipitor [atorvastatin calcium], Phenergan [promethazine hcl], Pravachol, Promethazine, Promethazine hcl, Sulfa antibiotics, Sulfamethoxazole, Zetia [ezetimibe], and Zocor [simvastatin - high dose]   Social History   Socioeconomic History   Marital status: Married    Spouse name: Not on file   Number of children: 2   Years of  education: 13   Highest education level: Not on file  Occupational History   Occupation: Homemaker  Tobacco Use   Smoking status: Never   Smokeless tobacco: Never   Tobacco comments:    married, lives with spouse - retired  Building services engineer Use: Never used  Substance and Sexual Activity   Alcohol use: No   Drug use: No   Sexual activity: Not Currently  Other Topics Concern   Not on file  Social History Narrative   Regular exercise-yes, walk 2-3 times a week   Caffeine Use-no   Social Determinants of Health   Financial Resource Strain: Low Risk    Difficulty of Paying Living Expenses: Not hard at all  Food  Insecurity: No Food Insecurity   Worried About Programme researcher, broadcasting/film/video in the Last Year: Never true   Barista in the Last Year: Never true  Transportation Needs: No Transportation Needs   Lack of Transportation (Medical): No   Lack of Transportation (Non-Medical): No  Physical Activity: Sufficiently Active   Days of Exercise per Week: 5 days   Minutes of Exercise per Session: 30 min  Stress: No Stress Concern Present   Feeling of Stress : Not at all  Social Connections: Socially Integrated   Frequency of Communication with Friends and Family: More than three times a week   Frequency of Social Gatherings with Friends and Family: More than three times a week   Attends Religious Services: More than 4 times per year   Active Member of [employer] or Organizations: Yes   Attends Engineer, structural: More than 4 times per year   Marital Status: Married     Family History: The patient's family history includes Arthritis in her father and mother; Cancer in her mother; Hyperlipidemia in her father and mother. There is no history of Breast cancer.  ROS:   Please see the history of present illness.    Arthritis is her major limiting factor.  Statins have traditionally caused her to have trouble.  All other systems reviewed and are negative.  EKGs/Labs/Other Studies Reviewed:    The following studies were reviewed today:  Most recent LDL was greater than 110 on rosuvastatin.  Target should be less than 70.  Discussed coronary calcium score but she was not inclined.  ECHOCARDIOGRAM 2019: Study Conclusions   - Left ventricle: The cavity size was normal. Systolic function was    normal. The estimated ejection fraction was in the range of 60%    to 65%. Wall motion was normal; there were no regional wall    motion abnormalities. Left ventricular diastolic function    parameters were normal.  - Aortic valve: There was no regurgitation.  - Mitral valve: There was no regurgitation.  -  Right ventricle: The cavity size was normal. Wall thickness was    normal. Systolic function was normal.  - Right atrium: The atrium was normal in size.  - Tricuspid valve: There was mild regurgitation.  - Pulmonary arteries: Systolic pressure was at the upper limits of    normal.  - Inferior vena cava: The vessel was normal in size.  - Pericardium, extracardiac: There was no pericardial effusion.   EKG:  EKG normal sinus rhythm with normal appearance.  Recent Labs: 03/01/2020: ALT 20; BUN 14; Creatinine, Ser 0.62; Hemoglobin 14.9; Platelets 282.0; Potassium 3.8; Sodium 138 11/12/2020: TSH 0.64  Recent Lipid Panel    Component Value Date/Time   CHOL 231 (H)  03/01/2020 0950   CHOL 162 01/25/2019 1040   TRIG 116.0 03/01/2020 0950   HDL 78.20 03/01/2020 0950   HDL 75 01/25/2019 1040   CHOLHDL 3 03/01/2020 0950   VLDL 23.2 03/01/2020 0950   LDLCALC 130 (H) 03/01/2020 0950   LDLCALC 65 01/25/2019 1040   LDLDIRECT 94.2 07/28/2012 1010    Physical Exam:    VS:  BP 108/62   Pulse 69   Ht 5\' 2"  (1.575 m)   Wt 134 lb 9.6 oz (61.1 kg)   SpO2 95%   BMI 24.62 kg/m     Wt Readings from Last 3 Encounters:  12/05/20 134 lb 9.6 oz (61.1 kg)  11/12/20 131 lb 12.8 oz (59.8 kg)  03/01/20 132 lb (59.9 kg)     GEN: Healthy appearing. No acute distress HEENT: Normal NECK: No JVD. LYMPHATICS: No lymphadenopathy CARDIAC: No murmur. RRR no gallop, or edema. VASCULAR:  Normal Pulses. No bruits. RESPIRATORY:  Clear to auscultation without rales, wheezing or rhonchi  ABDOMEN: Soft, non-tender, non-distended, No pulsatile mass, MUSCULOSKELETAL: No deformity  SKIN: Warm and dry NEUROLOGIC:  Alert and oriented x 3 PSYCHIATRIC:  Normal affect   ASSESSMENT:    1. PSVT (paroxysmal supraventricular tachycardia) (HCC)   2. Hyperlipidemia, unspecified hyperlipidemia type   3. SOB (shortness of breath)   4. Palpitations    PLAN:    In order of problems listed above:  Clinically stable on  very low-dose metoprolol.  No change recommended. Discussed the long-term risk of elevated lipids.  Discussed coronary calcium score.  Decided to continue same regimen.  She is taking rosuvastatin 3 days/week.  Needs to have a yearly lipid panel. Denies shortness of breath Rare episodes of palpitation as outlined above, likely PSVT.  They are not disruptive in her lifestyle.  She has mechanisms to control.  She does understand that ablation could be an option.  It was also offered to her to 25 years ago.  We will plan to continue the same treatment strategy.   Follow-up in 1 year with 14/09/21   Medication Adjustments/Labs and Tests Ordered: Current medicines are reviewed at length with the patient today.  Concerns regarding medicines are outlined above.  Orders Placed This Encounter  Procedures   EKG 12-Lead   No orders of the defined types were placed in this encounter.   There are no Patient Instructions on file for this visit.   Signed, Lucile Crater, MD  12/05/2020 9:46 AM    [facility]

## 2020-12-05 ENCOUNTER — Encounter: Payer: Self-pay | Admitting: Interventional Cardiology

## 2020-12-05 ENCOUNTER — Other Ambulatory Visit: Payer: Self-pay

## 2020-12-05 ENCOUNTER — Ambulatory Visit (INDEPENDENT_AMBULATORY_CARE_PROVIDER_SITE_OTHER): Payer: Medicare Other | Admitting: Interventional Cardiology

## 2020-12-05 VITALS — BP 108/62 | HR 69 | Ht 62.0 in | Wt 134.6 lb

## 2020-12-05 DIAGNOSIS — E785 Hyperlipidemia, unspecified: Secondary | ICD-10-CM | POA: Diagnosis not present

## 2020-12-05 DIAGNOSIS — I471 Supraventricular tachycardia: Secondary | ICD-10-CM

## 2020-12-05 DIAGNOSIS — R002 Palpitations: Secondary | ICD-10-CM

## 2020-12-05 DIAGNOSIS — R0602 Shortness of breath: Secondary | ICD-10-CM | POA: Diagnosis not present

## 2020-12-05 NOTE — Patient Instructions (Signed)
Medication Instructions:  Your physician recommends that you continue on your current medications as directed. Please refer to the Current Medication list given to you today.  *If you need a refill on your cardiac medications before your next appointment, please call your pharmacy*   Lab Work: None If you have labs (blood work) drawn today and your tests are completely normal, you will receive your results only by: MyChart Message (if you have MyChart) OR A paper copy in the mail If you have any lab test that is abnormal or we need to change your treatment, we will call you to review the results.   Testing/Procedures: None   Follow-Up: At [facility], you and your health needs are our priority.  As part of our continuing mission to provide you with exceptional heart care, we have created designated Provider Care Teams.  These Care Teams include your primary Cardiologist (physician) and Advanced Practice Providers (APPs -  Physician Assistants and Nurse Practitioners) who all work together to provide you with the care you need, when you need it.  We recommend signing up for the patient portal called "MyChart".  Sign up information is provided on this After Visit Summary.  MyChart is used to connect with patients for Virtual Visits (Telemedicine).  Patients are able to view lab/test results, encounter notes, upcoming appointments, etc.  Non-urgent messages can be sent to your provider as well.   To learn more about what you can do with MyChart, go to [email or web address].    Your next appointment:   1 year(s)  The format for your next appointment:   In Person  Provider:   Ronie Spies, PA-C   Other Instructions

## 2021-01-02 ENCOUNTER — Other Ambulatory Visit: Payer: Self-pay | Admitting: Internal Medicine

## 2021-01-16 ENCOUNTER — Other Ambulatory Visit: Payer: Self-pay | Admitting: Internal Medicine

## 2021-01-23 ENCOUNTER — Other Ambulatory Visit: Payer: Self-pay | Admitting: Internal Medicine

## 2021-03-29 ENCOUNTER — Other Ambulatory Visit: Payer: Self-pay

## 2021-03-29 MED ORDER — METOPROLOL SUCCINATE ER 25 MG PO TB24: ORAL_TABLET | ORAL | 2 refills | Status: DC

## 2021-04-04 ENCOUNTER — Other Ambulatory Visit: Payer: Self-pay | Admitting: *Deleted

## 2021-04-04 MED ORDER — ROSUVASTATIN CALCIUM 40 MG PO TABS: 40.0000 mg | ORAL_TABLET | ORAL | 3 refills | Status: DC

## 2021-05-06 ENCOUNTER — Encounter: Payer: Self-pay | Admitting: Internal Medicine

## 2021-05-06 DIAGNOSIS — N952 Postmenopausal atrophic vaginitis: Secondary | ICD-10-CM | POA: Insufficient documentation

## 2021-05-06 NOTE — Progress Notes (Signed)
Subjective:    Patient ID: Elizabeth Mills, female    DOB: 1945/06/24, 76 y.o.   MRN: [id number]  This visit occurred during the SARS-CoV-2 public health emergency.  Safety protocols were in place, including screening questions prior to the visit, additional usage of staff PPE, and extensive cleaning of exam room while observing appropriate contact time as indicated for disinfecting solutions.     HPI The patient is here for follow up of their chronic medical problems, including hypothyroid, asthma, vaginal atrophy, PSVT, hld  She keeps gained weight and wondered if her thyroid is off.  She is not doing anything different.  Eating and activity is about same  - maybe a little different with winter.  She has noticed some hair loss.  She started taking biotin and that has helped.  Since January she had a cold and was treated w/ doxycycline ---  since then has had drainage in her throat and is clearing her throat.  She is taking claritin and uses the flonase prn.  She is still able to get some thick mucus out that is gray in color.  She feels like her symptoms are more than just typical for allergies and wonders if she still has an element of the infection.  Medications and allergies reviewed with patient and updated if appropriate.  Patient Active Problem List   Diagnosis Date Noted   Vaginal atrophy 05/06/2021   Right hip pain 03/01/2020   PSVT (paroxysmal supraventricular tachycardia) ([place]) 02/29/2020   Occipital neuralgia of right side 04/27/2019   Left elbow pain 04/05/2019   Splitting of nail 10/28/2017   Claudication ([place]) 01/15/2017   Nephrolithiasis 09/22/2016   Osteopenia 06/27/2015   Amblyopia of right eye 03/07/2014   Nuclear sclerosis of both eyes 03/07/2014   Hip bursitis 01/25/2014   Alopecia 07/28/2012   Nuclear cataract 02/04/2012   Asthma    Allergic rhinitis    Hypercholesterolemia    Back pain    Colitis    Hypothyroidism     Current Outpatient  Medications on File Prior to Visit  Medication Sig Dispense Refill   ADVAIR DISKUS 250-50 MCG/ACT AEPB USE 1 INHALATION BY MOUTH  EVERY 12 HOURS 180 each 3   estradiol (ESTRACE) 0.1 MG/GM vaginal cream PLACE 1 APPLICATORFUL  VAGINALLY WEEKLY 85 g 3   fluticasone (FLONASE) 50 MCG/ACT nasal spray PLACE 2 SPRAYS INTO BOTH NOSTRILS DAILY AS NEEDED FOR ALLERGIES OR RHINITIS (ALLERGIES). 16 mL 1   loratadine (CLARITIN) 10 MG tablet Take 10 mg by mouth daily as needed for allergies (allergies).     metoprolol succinate (TOPROL-XL) 25 MG 24 hr tablet TAKE ONE-HALF TABLET BY  MOUTH DAILY , MAY ALSO TAKE AN EXTRA ONE-HALF TABLET BY MOUTH DAILY AS NEEDED FOR  PALPITATIONS 90 tablet 2   PROAIR HFA 108 (90 Base) MCG/ACT inhaler USE 1 TO 2 INHALATIONS BY  MOUTH EVERY 6 HOURS AS  NEEDED FOR WHEEZING OR  SHORTNESS OF BREATH 34 g 3   rosuvastatin (CRESTOR) 40 MG tablet Take 1 tablet (40 mg total) by mouth 3 (three) times a week. 36 tablet 3   SYNTHROID 75 MCG tablet TAKE 1 TABLET BY MOUTH  DAILY BEFORE BREAKFAST 90 tablet 3   VITAMIN D, CHOLECALCIFEROL, PO Patient take 2 gummies (2,000 units total) by mouth daily     No current facility-administered medications on file prior to visit.    Past Medical History:  Diagnosis Date   Allergic rhinitis, cause unspecified  Asthma    Back pain    Colitis    Diverticulitis    Generalized headaches    GERD (gastroesophageal reflux disease)    Hypercholesterolemia    Hypothyroidism    LVH (left ventricular hypertrophy)    PSVT (paroxysmal supraventricular tachycardia) ([facility])    Statin intolerance     Past Surgical History:  Procedure Laterality Date   ABDOMINAL HYSTERECTOMY  [place]   BREAST EXCISIONAL BIOPSY Bilateral    COLONOSCOPY     EYE SURGERY  11/14/2009, 1990   blocked tear duct repair   TONSILLECTOMY  1953    Social History   Socioeconomic History   Marital status: Married    Spouse  name: Not on file   Number of children: 2   Years of education: 13   Highest education level: Not on file  Occupational History   Occupation: Homemaker  Tobacco Use   Smoking status: Never   Smokeless tobacco: Never   Tobacco comments:    married, lives with spouse - retired  Scientific laboratory technician Use: Never used  Substance and Sexual Activity   Alcohol use: No   Drug use: No   Sexual activity: Not Currently  Other Topics Concern   Not on file  Social History Narrative   Regular exercise-yes, walk 2-3 times a week   Caffeine Use-no   Social Determinants of Health   Financial Resource Strain: Low Risk    Difficulty of Paying Living Expenses: Not hard at all  Food Insecurity: No Food Insecurity   Worried About Charity fundraiser in the Last Year: Never true   Arboriculturist in the Last Year: Never true  Transportation Needs: No Transportation Needs   Lack of Transportation (Medical): No   Lack of Transportation (Non-Medical): No  Physical Activity: Sufficiently Active   Days of Exercise per Week: 5 days   Minutes of Exercise per Session: 30 min  Stress: No Stress Concern Present   Feeling of Stress : Not at all  Social Connections: Socially Integrated   Frequency of Communication with Friends and Family: More than three times a week   Frequency of Social Gatherings with Friends and Family: More than three times a week   Attends Religious Services: More than 4 times per year   Active Member of [employer] or Organizations: Yes   Attends Music therapist: More than 4 times per year   Marital Status: Married    Family History  Problem Relation Age of Onset   Cancer Mother        Kidney and Ovarian Cancer   Arthritis Mother    Hyperlipidemia Mother    Arthritis Father    Hyperlipidemia Father    Breast cancer Neg Hx     Review of Systems  Constitutional:  Negative for fever.  HENT:  Positive for postnasal drip (clearing the throat). Negative for  congestion, ear pain, sinus pressure and sore throat.   Respiratory:  Positive for cough (occ from PND). Negative for shortness of breath and wheezing.   Cardiovascular:  Negative for chest pain, palpitations and leg swelling.  Gastrointestinal:  Negative for abdominal pain, constipation, diarrhea and nausea.       No gerd  Skin:        Hair seems like it is falling out.    Neurological:  Positive for dizziness. Negative for light-headedness  and headaches.      Objective:   Vitals:   05/08/21 0933  BP: 112/70  Pulse: 70  Temp: 98.2 F (36.8 C)  SpO2: 93%   BP Readings from Last 3 Encounters:  05/08/21 112/70  12/05/20 108/62  11/12/20 118/70   Wt Readings from Last 3 Encounters:  05/08/21 138 lb 12.8 oz (63 kg)  12/05/20 134 lb 9.6 oz (61.1 kg)  11/12/20 131 lb 12.8 oz (59.8 kg)   Body mass index is 25.39 kg/m.   Physical Exam    Constitutional: Appears well-developed and well-nourished. No distress.  HENT:  Head: Normocephalic and atraumatic.  Neck: Neck supple. No tracheal deviation present. No thyromegaly present.  No cervical lymphadenopathy Cardiovascular: Normal rate, regular rhythm and normal heart sounds.  No murmur heard. No carotid bruit .  No edema Pulmonary/Chest: Effort normal and breath sounds normal. No respiratory distress. No has no wheezes. No rales.  Skin, hair: Skin is warm and dry. Not diaphoretic.  No obvious hair loss. Psychiatric: Normal mood and affect. Behavior is normal.      Assessment & Plan:    See Problem List for Assessment and Plan of chronic medical problems.

## 2021-05-06 NOTE — Patient Instructions (Addendum)
° ° °  Blood work was ordered.     Medications changes include :   doxycyline 100 mg twice daily x 5 days.    Please followup in 1 year

## 2021-05-08 ENCOUNTER — Other Ambulatory Visit: Payer: Self-pay

## 2021-05-08 ENCOUNTER — Ambulatory Visit: Payer: Medicare Other | Admitting: Internal Medicine

## 2021-05-08 VITALS — BP 112/70 | HR 70 | Temp 98.2°F | Ht 62.0 in | Wt 138.8 lb

## 2021-05-08 DIAGNOSIS — E7800 Pure hypercholesterolemia, unspecified: Secondary | ICD-10-CM | POA: Diagnosis not present

## 2021-05-08 DIAGNOSIS — J4520 Mild intermittent asthma, uncomplicated: Secondary | ICD-10-CM

## 2021-05-08 DIAGNOSIS — J0100 Acute maxillary sinusitis, unspecified: Secondary | ICD-10-CM

## 2021-05-08 DIAGNOSIS — E039 Hypothyroidism, unspecified: Secondary | ICD-10-CM | POA: Diagnosis not present

## 2021-05-08 DIAGNOSIS — L659 Nonscarring hair loss, unspecified: Secondary | ICD-10-CM | POA: Insufficient documentation

## 2021-05-08 DIAGNOSIS — N952 Postmenopausal atrophic vaginitis: Secondary | ICD-10-CM | POA: Diagnosis not present

## 2021-05-08 DIAGNOSIS — I471 Supraventricular tachycardia: Secondary | ICD-10-CM

## 2021-05-08 DIAGNOSIS — J329 Chronic sinusitis, unspecified: Secondary | ICD-10-CM | POA: Insufficient documentation

## 2021-05-08 MED ORDER — BIOTIN 300 MCG PO TABS: ORAL_TABLET | ORAL | 0 refills | Status: DC

## 2021-05-08 MED ORDER — DOXYCYCLINE HYCLATE 100 MG PO TABS: 100.0000 mg | ORAL_TABLET | Freq: Two times a day (BID) | ORAL | 0 refills | Status: AC

## 2021-05-08 NOTE — Assessment & Plan Note (Signed)
Chronic Controlled, Stable Continue metoprolol XL 12.5 mg daily Managed by cardiology

## 2021-05-08 NOTE — Assessment & Plan Note (Signed)
Acute She has noticed some hair loss, which is improved with taking biotin ?  Thyroid or not-likely not since it is improved with taking biotin Will check TSH, B12 Continue biotin daily

## 2021-05-08 NOTE — Assessment & Plan Note (Signed)
Chronic Regular exercise and healthy diet encouraged Check lipid panel  Continue Crestor 40 mg 3 times weekly

## 2021-05-08 NOTE — Assessment & Plan Note (Signed)
Chronic Continue estradiol cream

## 2021-05-08 NOTE — Assessment & Plan Note (Signed)
Acute Partially treated sinus infection I think some of her symptoms are suggestive of residual sinus infection-more than just allergies for her Will treat with doxycycline 100 mg twice daily for 5 days Continue Claritin and Flonase

## 2021-05-08 NOTE — Assessment & Plan Note (Addendum)
Chronic Mild, persistent Controlled Continue Advair 250-50 twice daily  Continue ProAir every 6 hours as needed

## 2021-05-08 NOTE — Assessment & Plan Note (Addendum)
Chronic  Has been experiencing some hair loss and weight gain-?  Clinically hypothyroid Currently taking Synthroid 75 mcg daily Check tsh after she holds biotin for few days Titrate med dose if needed

## 2021-05-10 ENCOUNTER — Other Ambulatory Visit (INDEPENDENT_AMBULATORY_CARE_PROVIDER_SITE_OTHER): Payer: Medicare Other

## 2021-05-10 ENCOUNTER — Other Ambulatory Visit: Payer: Self-pay

## 2021-05-10 DIAGNOSIS — E7800 Pure hypercholesterolemia, unspecified: Secondary | ICD-10-CM | POA: Diagnosis not present

## 2021-05-10 DIAGNOSIS — E039 Hypothyroidism, unspecified: Secondary | ICD-10-CM

## 2021-05-10 DIAGNOSIS — L659 Nonscarring hair loss, unspecified: Secondary | ICD-10-CM

## 2021-05-10 LAB — LIPID PANEL
Cholesterol: 250 mg/dL — ABNORMAL HIGH (ref 0–200)
HDL: 80.3 mg/dL (ref >39.00)
LDL Cholesterol: 137 mg/dL — ABNORMAL HIGH (ref 0–99)
NonHDL: 169.29
Total CHOL/HDL Ratio: 3
Triglycerides: 159 mg/dL — ABNORMAL HIGH (ref 0.0–149.0)
VLDL: 31.8 mg/dL (ref 0.0–40.0)

## 2021-05-10 LAB — COMPREHENSIVE METABOLIC PANEL
ALT: 13 U/L (ref 0–35)
AST: 19 U/L (ref 0–37)
Albumin: 4.2 g/dL (ref 3.5–5.2)
Alkaline Phosphatase: 40 U/L (ref 39–117)
BUN: 15 mg/dL (ref 6–23)
CO2: 36 mEq/L — ABNORMAL HIGH (ref 19–32)
Calcium: 9.3 mg/dL (ref 8.4–10.5)
Chloride: 102 mEq/L (ref 96–112)
Creatinine, Ser: 0.73 mg/dL (ref 0.40–1.20)
GFR: 80.36 mL/min (ref >60.00)
Glucose, Bld: 102 mg/dL — ABNORMAL HIGH (ref 70–99)
Potassium: 4.1 mEq/L (ref 3.5–5.1)
Sodium: 140 mEq/L (ref 135–145)
Total Bilirubin: 0.8 mg/dL (ref 0.2–1.2)
Total Protein: 7.2 g/dL (ref 6.0–8.3)

## 2021-05-10 LAB — CBC WITH DIFFERENTIAL/PLATELET
Basophils Absolute: 0.1 10*3/uL (ref 0.0–0.1)
Basophils Relative: 1.1 % (ref 0.0–3.0)
Eosinophils Absolute: 0.4 10*3/uL (ref 0.0–0.7)
Eosinophils Relative: 8.1 % — ABNORMAL HIGH (ref 0.0–5.0)
HCT: 43.2 % (ref 36.0–46.0)
Hemoglobin: 14.8 g/dL (ref 12.0–15.0)
Lymphocytes Relative: 30.9 % (ref 12.0–46.0)
Lymphs Abs: 1.5 10*3/uL (ref 0.7–4.0)
MCHC: 34.3 g/dL (ref 30.0–36.0)
MCV: 90.4 fl (ref 78.0–100.0)
Monocytes Absolute: 0.5 10*3/uL (ref 0.1–1.0)
Monocytes Relative: 10 % (ref 3.0–12.0)
Neutro Abs: 2.4 10*3/uL (ref 1.4–7.7)
Neutrophils Relative %: 49.9 % (ref 43.0–77.0)
Platelets: 284 10*3/uL (ref 150.0–400.0)
RBC: 4.78 Mil/uL (ref 3.87–5.11)
RDW: 13.4 % (ref 11.5–15.5)
WBC: 4.9 10*3/uL (ref 4.0–10.5)

## 2021-05-10 LAB — VITAMIN B12: Vitamin B-12: 328 pg/mL (ref 211–911)

## 2021-05-10 LAB — TSH: TSH: 1.88 u[IU]/mL (ref 0.35–5.50)

## 2021-07-23 ENCOUNTER — Other Ambulatory Visit: Payer: Self-pay | Admitting: Internal Medicine

## 2021-07-23 MED ORDER — ALBUTEROL SULFATE HFA 108 (90 BASE) MCG/ACT IN AERS: 1.0000 | INHALATION_SPRAY | RESPIRATORY_TRACT | 3 refills | Status: DC | PRN

## 2021-07-29 ENCOUNTER — Other Ambulatory Visit: Payer: Self-pay | Admitting: Internal Medicine

## 2021-07-29 DIAGNOSIS — Z1231 Encounter for screening mammogram for malignant neoplasm of breast: Secondary | ICD-10-CM

## 2021-08-29 ENCOUNTER — Ambulatory Visit
Admission: RE | Admit: 2021-08-29 | Discharge: 2021-08-29 | Disposition: A | Discharge status: Home / Self Care | Payer: Medicare Other | Source: Ambulatory Visit | Attending: Internal Medicine | Admitting: Internal Medicine

## 2021-08-29 DIAGNOSIS — Z1231 Encounter for screening mammogram for malignant neoplasm of breast: Secondary | ICD-10-CM

## 2021-12-03 ENCOUNTER — Ambulatory Visit (INDEPENDENT_AMBULATORY_CARE_PROVIDER_SITE_OTHER): Payer: Medicare Other | Admitting: Internal Medicine

## 2021-12-03 ENCOUNTER — Encounter: Payer: Self-pay | Admitting: Internal Medicine

## 2021-12-03 ENCOUNTER — Ambulatory Visit (INDEPENDENT_AMBULATORY_CARE_PROVIDER_SITE_OTHER): Payer: Medicare Other

## 2021-12-03 VITALS — BP 118/60 | HR 79 | Temp 98.2°F | Ht 62.0 in | Wt 138.2 lb

## 2021-12-03 DIAGNOSIS — J4541 Moderate persistent asthma with (acute) exacerbation: Secondary | ICD-10-CM | POA: Diagnosis not present

## 2021-12-03 DIAGNOSIS — Z0000 Encounter for general adult medical examination without abnormal findings: Secondary | ICD-10-CM | POA: Diagnosis not present

## 2021-12-03 DIAGNOSIS — J45901 Unspecified asthma with (acute) exacerbation: Secondary | ICD-10-CM | POA: Insufficient documentation

## 2021-12-03 MED ORDER — CLOTRIMAZOLE 10 MG MT TROC: 10.0000 mg | Freq: Every day | OROMUCOSAL | 0 refills | Status: AC

## 2021-12-03 MED ORDER — AMOXICILLIN-POT CLAVULANATE 875-125 MG PO TABS: 1.0000 | ORAL_TABLET | Freq: Two times a day (BID) | ORAL | 0 refills | Status: DC

## 2021-12-03 NOTE — Patient Instructions (Addendum)
       Medications changes include :   augmentin and clotrimazole for possible thrush   Take mucinex.  Continue your inhalers    Your prescription(s) have been sent to your pharmacy.      Return if symptoms worsen or fail to improve.

## 2021-12-03 NOTE — Assessment & Plan Note (Signed)
Acute Having cold symptoms for the past 2 weeks and exacerbation of asthma It does sound like she has a bacterial infection Start Augmentin 875-125 mg twice daily Continue over-the-counter cold medications for symptom relief She did increase her Advair to twice daily and has been using the albuterol more often and that has helped her asthma exacerbation and she feels her symptoms are controlled.  Minimal wheezing, coarseness on exam-continue Advair inhaler twice daily and albuterol 2-3 times a day If symptoms do not improve over the next couple of days she will let me know and we can consider oral steroids

## 2021-12-03 NOTE — Progress Notes (Signed)
Subjective:   Elizabeth Mills is a 76 y.o. female who presents for Medicare Annual (Subsequent) preventive examination.  Review of Systems     Cardiac Risk Factors include: advanced age (>56men, >78 women);dyslipidemia     Objective:    Today's Vitals   12/03/21 1033  BP: 118/60  Pulse: 79  Temp: 98.2 F (36.8 C)  SpO2: 95%  Weight: 138 lb 3.2 oz (62.7 kg)  Height: 5\' 2"  (1.575 m)  PainSc: 0-No pain   Body mass index is 25.28 kg/m.     11/12/2020   10:25 AM 08/27/2017    5:13 PM 08/28/2015    9:41 AM  Advanced Directives  Does Patient Have a Medical Advance Directive? Yes Yes Yes  Type of Advance Directive Living will;Healthcare Power of 10/28/2015 Power of [place];Living will   Does patient want to make changes to medical advance directive? No - Patient declined    Copy of Healthcare Power of Attorney in Chart? No - copy requested No - copy requested     Current Medications (verified) Outpatient Encounter Medications as of 12/03/2021  Medication Sig   ADVAIR DISKUS 250-50 MCG/ACT AEPB USE 1 INHALATION BY MOUTH  EVERY 12 HOURS   albuterol (PROAIR HFA) 108 (90 Base) MCG/ACT inhaler Inhale 1-2 puffs into the lungs every 4 (four) hours as needed for wheezing or shortness of breath.   estradiol (ESTRACE) 0.1 MG/GM vaginal cream PLACE 1 APPLICATORFUL  VAGINALLY WEEKLY   fluticasone (FLONASE) 50 MCG/ACT nasal spray PLACE 2 SPRAYS INTO BOTH NOSTRILS DAILY AS NEEDED FOR ALLERGIES OR RHINITIS (ALLERGIES).   loratadine (CLARITIN) 10 MG tablet Take 10 mg by mouth daily as needed for allergies (allergies).   metoprolol succinate (TOPROL-XL) 25 MG 24 hr tablet TAKE ONE-HALF TABLET BY  MOUTH DAILY , MAY ALSO TAKE AN EXTRA ONE-HALF TABLET BY MOUTH DAILY AS NEEDED FOR  PALPITATIONS   rosuvastatin (CRESTOR) 40 MG tablet Take 1 tablet (40 mg total) by mouth 3 (three) times a week.   SYNTHROID 75 MCG tablet TAKE 1 TABLET BY MOUTH  DAILY BEFORE BREAKFAST   VITAMIN D,  CHOLECALCIFEROL, PO Patient take 2 gummies (2,000 units total) by mouth daily   Biotin 300 MCG TABS Taking daily (Patient not taking: Reported on 12/03/2021)   No facility-administered encounter medications on file as of 12/03/2021.    Allergies (verified) Crestor [rosuvastatin calcium], Lipitor [atorvastatin calcium], Phenergan [promethazine hcl], Pravachol, Promethazine, Promethazine hcl, Sulfa antibiotics, Sulfamethoxazole, Zetia [ezetimibe], Zocor [simvastatin - high dose], and Pseudoephedrine   History: Past Medical History:  Diagnosis Date   Allergic rhinitis, cause unspecified    Asthma    Back pain    Colitis    Diverticulitis    Generalized headaches    GERD (gastroesophageal reflux disease)    Hypercholesterolemia    Hypothyroidism    LVH (left ventricular hypertrophy)    PSVT (paroxysmal supraventricular tachycardia) ([facility])    Statin intolerance    Past Surgical History:  Procedure Laterality Date   ABDOMINAL HYSTERECTOMY  1983   APPENDECTOMY  1955   BACK SURGERY     BREAST BIOPSY  1982   BREAST EXCISIONAL BIOPSY Bilateral    COLONOSCOPY     EYE SURGERY  11/14/2009, 1990   blocked tear duct repair   TONSILLECTOMY  1953   Family History  Problem Relation Age of Onset   Cancer Mother        Kidney and Ovarian Cancer   Arthritis Mother    Hyperlipidemia Mother  Arthritis Father    Hyperlipidemia Father    Breast cancer Neg Hx    Social History   Socioeconomic History   Marital status: Married    Spouse name: Not on file   Number of children: 2   Years of education: 13   Highest education level: Not on file  Occupational History   Occupation: Homemaker  Tobacco Use   Smoking status: Never   Smokeless tobacco: Never   Tobacco comments:    married, lives with spouse - retired  Building services engineerVaping Use   Vaping Use: Never used  Substance and Sexual Activity   Alcohol use: No   Drug use: No   Sexual activity: Not Currently  Other Topics Concern   Not on file   Social History Narrative   Regular exercise-yes, walk 2-3 times a week   Caffeine Use-no   Social Determinants of Health   Financial Resource Strain: Low Risk  (12/03/2021)   Overall Financial Resource Strain (CARDIA)    Difficulty of Paying Living Expenses: Not hard at all  Food Insecurity: No Food Insecurity (12/03/2021)   Hunger Vital Sign    Worried About Running Out of Food in the Last Year: Never true    Ran Out of Food in the Last Year: Never true  Transportation Needs: No Transportation Needs (12/03/2021)   PRAPARE - Administrator, Civil ServiceTransportation    Lack of Transportation (Medical): No    Lack of Transportation (Non-Medical): No  Physical Activity: Sufficiently Active (12/03/2021)   Exercise Vital Sign    Days of Exercise per Week: 5 days    Minutes of Exercise per Session: 30 min  Stress: No Stress Concern Present (12/03/2021)   [employer] - Occupational Stress Questionnaire    Feeling of Stress : Not at all  Social Connections: Socially Integrated (12/03/2021)   Social Connection and Isolation Panel [NHANES]    Frequency of Communication with Friends and Family: More than three times a week    Frequency of Social Gatherings with Friends and Family: More than three times a week    Attends Religious Services: More than 4 times per year    Active Member of [employer] or Organizations: Yes    Attends Engineer, structuralClub or Organization Meetings: More than 4 times per year    Marital Status: Married    Tobacco Counseling Counseling given: Not Answered Tobacco comments: married, lives with spouse - retired   Clinical Intake:  Pre-visit preparation completed: Yes  Pain : No/denies pain Pain Score: 0-No pain     BMI - recorded: 25.28 Nutritional Status: BMI 25 -29 Overweight Nutritional Risks: None Diabetes: No  How often do you need to have someone help you when you read instructions, pamphlets, or other written materials from your doctor or pharmacy?: 1 - Never What  is the last grade level you completed in school?: HSG; 1 year of college  Diabetic? no  Interpreter Needed?: No  Information entered by :: Susie CassetteShenika Armine Rizzolo, LPN.   Activities of Daily Living    12/03/2021   11:19 AM  In your present state of health, do you have any difficulty performing the following activities:  Hearing? 0  Vision? 0  Difficulty concentrating or making decisions? 0  Walking or climbing stairs? 0  Dressing or bathing? 0  Doing errands, shopping? 0  Preparing Food and eating ? N  Using the Toilet? N  In the past six months, have you accidently leaked urine? N  Do you have problems with loss of  bowel control? N  Managing your Medications? N  Managing your Finances? N  Housekeeping or managing your Housekeeping? N    Patient Care Team: Pincus Sanes, MD as PCP - General (Internal Medicine) Cassell Clement, MD (Cardiology) Gretta Cool, MD (Inactive) (Obstetrics and Gynecology) Bernette Redbird, MD (Gastroenterology) Mia Creek, MD as Consulting Physician (Ophthalmology) [facility], [place] as Consulting Physician (Optometry)  Indicate any recent Medical Services you may have received from other than [facility] providers in the past year (date may be approximate).     Assessment:   This is a routine wellness examination for [place].  Hearing/Vision screen Hearing Screening - Comments:: No hearing difficulties. Vision Screening - Comments:: Patient does not wear any corrective lenses/contacts.   Eye exam done by: [facility]   Dietary issues and exercise activities discussed: Current Exercise Habits: Home exercise routine, Type of exercise: walking, Time (Minutes): 30, Frequency (Times/Week): 5, Weekly Exercise (Minutes/Week): 150, Intensity: Moderate, Exercise limited by: respiratory conditions(s)   Goals Addressed             This Visit's Progress    To maintain my current health status by continuing to eat healthy, stay  physically active and socially active.        Depression Screen    12/03/2021   11:16 AM 05/10/2021    2:07 PM 11/12/2020   10:24 AM 03/01/2020   10:33 AM 08/27/2017    5:13 PM 09/22/2016    9:44 AM 09/22/2016    9:43 AM  PHQ 2/9 Scores  PHQ - 2 Score 0 0 0 0 1 0 0  PHQ- 9 Score      0     Fall Risk    12/03/2021   11:19 AM 05/10/2021    2:07 PM 11/12/2020   10:26 AM 04/05/2019    3:18 PM 08/27/2017    5:13 PM  Fall Risk   Falls in the past year? 0 1 1 0 No  Number falls in past yr: 0 1 0 0   Injury with Fall? 0 0 0    Risk for fall due to : No Fall Risks No Fall Risks     Follow up Falls prevention discussed Falls evaluation completed Falls evaluation completed      FALL RISK PREVENTION PERTAINING TO THE HOME:  Any stairs in or around the home? Yes  If so, are there any without handrails? No  Home free of loose throw rugs in walkways, pet beds, electrical cords, etc? Yes  Adequate lighting in your home to reduce risk of falls? Yes   ASSISTIVE DEVICES UTILIZED TO PREVENT FALLS:  Life alert? No  Use of a cane, walker or w/c? No  Grab bars in the bathroom? Yes  Shower chair or bench in shower? Yes  Elevated toilet seat or a handicapped toilet? Yes   TIMED UP AND GO:  Was the test performed? Yes .  Length of time to ambulate 10 feet: 69 sec.   Gait steady and fast without use of assistive device  Cognitive Function:        12/03/2021   11:20 AM  6CIT Screen  What Year? 0 points  What month? 0 points  What time? 0 points  Count back from 20 0 points  Months in reverse 0 points  Repeat phrase 0 points  Total Score 0 points    Immunizations Immunization History  Administered Date(s) Administered   Fluad Quad(high Dose 65+) 03/01/2020, 02/05/2021  Influenza Split 12/23/2011   Influenza, High Dose Seasonal PF 02/18/2011, 12/26/2016, 02/24/2018   Influenza,inj,Quad PF,6+ Mos 03/10/2013, 12/30/2013, 03/03/2016, 12/23/2018   PFIZER Comirnaty(Gray Top)Covid-19  Tri-Sucrose Vaccine 02/05/2021   PFIZER(Purple Top)SARS-COV-2 Vaccination 04/18/2019, 05/09/2019, 02/13/2020, 09/25/2020   Pneumococcal Conjugate-13 01/25/2014   Pneumococcal Polysaccharide-23 03/22/2015   Pneumococcal-Unspecified 03/25/2007   Td 03/24/2002   Tdap 12/30/2013    TDAP status: Up to date  Flu Vaccine status: Due, Education has been provided regarding the importance of this vaccine. Advised may receive this vaccine at local pharmacy or Health Dept. Aware to provide a copy of the vaccination record if obtained from local pharmacy or Health Dept. Verbalized acceptance and understanding.  Pneumococcal vaccine status: Up to date  Covid-19 vaccine status: Completed vaccines  Qualifies for Shingles Vaccine? Yes   Zostavax completed Yes   Shingrix Completed?: No.    Education has been provided regarding the importance of this vaccine. Patient has been advised to call insurance company to determine out of pocket expense if they have not yet received this vaccine. Advised may also receive vaccine at local pharmacy or Health Dept. Verbalized acceptance and understanding.  Screening Tests Health Maintenance  Topic Date Due   Zoster Vaccines- Shingrix (1 of 2) Never done   COVID-19 Vaccine (6 - Pfizer series) 04/02/2021   INFLUENZA VACCINE  06/22/2022 (Originally 10/22/2021)   TETANUS/TDAP  12/31/2023   Pneumonia Vaccine 72+ Years old  Completed   DEXA SCAN  Completed   Hepatitis C Screening  Completed   HPV VACCINES  Aged Out   COLONOSCOPY (Pts 45-23yrs Insurance coverage will need to be confirmed)  Discontinued    Health Maintenance  Health Maintenance Due  Topic Date Due   Zoster Vaccines- Shingrix (1 of 2) Never done   COVID-19 Vaccine (6 - Pfizer series) 04/02/2021    Colorectal cancer screening: No longer required.   Mammogram status: Completed 08/29/2021. Repeat every year  Bone Density status: Completed 06/22/2015. Results reflect: Bone density results: OSTEOPENIA.  Repeat every 2-3 years.  Lung Cancer Screening: (Low Dose CT Chest recommended if Age 75-80 years, 30 pack-year currently smoking OR have quit w/in 15years.) does not qualify.   Lung Cancer Screening Referral: no  Additional Screening:  Hepatitis C Screening: does qualify; Completed 03/22/2015  Vision Screening: Recommended annual ophthalmology exams for early detection of glaucoma and other disorders of the eye. Is the patient up to date with their annual eye exam?  Yes  Who is the provider or what is the name of the office in which the patient attends annual eye exams? [facility] If pt is not established with a provider, would they like to be referred to a provider to establish care? No .   Dental Screening: Recommended annual dental exams for proper oral hygiene  Community Resource Referral / Chronic Care Management: CRR required this visit?  No   CCM required this visit?  No      Plan:     I have personally reviewed and noted the following in the patient's chart:   Medical and social history Use of alcohol, tobacco or illicit drugs  Current medications and supplements including opioid prescriptions. Patient is not currently taking opioid prescriptions. Functional ability and status Nutritional status Physical activity Advanced directives List of other physicians Hospitalizations, surgeries, and ER visits in previous 12 months Vitals Screenings to include cognitive, depression, and falls Referrals and appointments  In addition, I have reviewed and discussed with patient certain preventive protocols, quality metrics,  and best practice recommendations. A written personalized care plan for preventive services as well as general preventive health recommendations were provided to patient.     Mickeal Needy, LPN   [id number]   Nurse Notes:  Normal cognitive status assessed by direct observation by this Nurse Health Advisor. No abnormalities found.

## 2021-12-03 NOTE — Patient Instructions (Signed)
Elizabeth Mills , Thank you for taking time to come for your Medicare Wellness Visit. I appreciate your ongoing commitment to your health goals. Please review the following plan we discussed and let me know if I can assist you in the future.   Screening recommendations/referrals: Colonoscopy: Discontinued due to age Mammogram: 08/29/2021; due every year Bone Density: Last done on 06/22/2015 Recommended yearly ophthalmology/optometry visit for glaucoma screening and checkup Recommended yearly dental visit for hygiene and checkup  Vaccinations: Influenza vaccine: due Fall Season 2023 Pneumococcal vaccine: 01/25/2014, 03/22/2015 Tdap vaccine: 12/30/2013; due every 10 years Shingles vaccine: never done   Covid-19:04/18/2019, 05/09/2019, 02/13/2020, 09/25/2020, 02/05/2021  Advanced directives: Yes  Conditions/risks identified: Yes  Next appointment: Follow up in one year for your annual wellness visit bu calling [phone] to schedule.   Preventive Care 17 Years and Older, Female Preventive care refers to lifestyle choices and visits with your health care provider that can promote health and wellness. What does preventive care include? A yearly physical exam. This is also called an annual well check. Dental exams once or twice a year. Routine eye exams. Ask your health care provider how often you should have your eyes checked. Personal lifestyle choices, including: Daily care of your teeth and gums. Regular physical activity. Eating a healthy diet. Avoiding tobacco and drug use. Limiting alcohol use. Practicing safe sex. Taking low-dose aspirin every day. Taking vitamin and mineral supplements as recommended by your health care provider. What happens during an annual well check? The services and screenings done by your health care provider during your annual well check will depend on your age, overall health, lifestyle risk factors, and family history of disease. Counseling  Your health care  provider may ask you questions about your: Alcohol use. Tobacco use. Drug use. Emotional well-being. Home and relationship well-being. Sexual activity. Eating habits. History of falls. Memory and ability to understand (cognition). Work and work Astronomer. Reproductive health. Screening  You may have the following tests or measurements: Height, weight, and BMI. Blood pressure. Lipid and cholesterol levels. These may be checked every 5 years, or more frequently if you are over 17 years old. Skin check. Lung cancer screening. You may have this screening every year starting at age 19 if you have a 30-pack-year history of smoking and currently smoke or have quit within the past 15 years. Fecal occult blood test (FOBT) of the stool. You may have this test every year starting at age 64. Flexible sigmoidoscopy or colonoscopy. You may have a sigmoidoscopy every 5 years or a colonoscopy every 10 years starting at age 48. Hepatitis C blood test. Hepatitis B blood test. Sexually transmitted disease (STD) testing. Diabetes screening. This is done by checking your blood sugar (glucose) after you have not eaten for a while (fasting). You may have this done every 1-3 years. Bone density scan. This is done to screen for osteoporosis. You may have this done starting at age 77. Mammogram. This may be done every 1-2 years. Talk to your health care provider about how often you should have regular mammograms. Talk with your health care provider about your test results, treatment options, and if necessary, the need for more tests. Vaccines  Your health care provider may recommend certain vaccines, such as: Influenza vaccine. This is recommended every year. Tetanus, diphtheria, and acellular pertussis (Tdap, Td) vaccine. You may need a Td booster every 10 years. Zoster vaccine. You may need this after age 34. Pneumococcal 13-valent conjugate (PCV13) vaccine. One dose is recommended  after age  61. Pneumococcal polysaccharide (PPSV23) vaccine. One dose is recommended after age 62. Talk to your health care provider about which screenings and vaccines you need and how often you need them. This information is not intended to replace advice given to you by your health care provider. Make sure you discuss any questions you have with your health care provider. Document Released: 04/06/2015 Document Revised: 11/28/2015 Document Reviewed: 01/09/2015 Elsevier Interactive Patient Education  2017 [place] Prevention in the Home Falls can cause injuries. They can happen to people of all ages. There are many things you can do to make your home safe and to help prevent falls. What can I do on the outside of my home? Regularly fix the edges of walkways and driveways and fix any cracks. Remove anything that might make you trip as you walk through a door, such as a raised step or threshold. Trim any bushes or trees on the path to your home. Use bright outdoor lighting. Clear any walking paths of anything that might make someone trip, such as rocks or tools. Regularly check to see if handrails are loose or broken. Make sure that both sides of any steps have handrails. Any raised decks and porches should have guardrails on the edges. Have any leaves, snow, or ice cleared regularly. Use sand or salt on walking paths during winter. Clean up any spills in your garage right away. This includes oil or grease spills. What can I do in the bathroom? Use night lights. Install grab bars by the toilet and in the tub and shower. Do not use towel bars as grab bars. Use non-skid mats or decals in the tub or shower. If you need to sit down in the shower, use a plastic, non-slip stool. Keep the floor dry. Clean up any water that spills on the floor as soon as it happens. Remove soap buildup in the tub or shower regularly. Attach bath mats securely with double-sided non-slip rug tape. Do not have throw  rugs and other things on the floor that can make you trip. What can I do in the bedroom? Use night lights. Make sure that you have a light by your bed that is easy to reach. Do not use any sheets or blankets that are too big for your bed. They should not hang down onto the floor. Have a firm chair that has side arms. You can use this for support while you get dressed. Do not have throw rugs and other things on the floor that can make you trip. What can I do in the kitchen? Clean up any spills right away. Avoid walking on wet floors. Keep items that you use a lot in easy-to-reach places. If you need to reach something above you, use a strong step stool that has a grab bar. Keep electrical cords out of the way. Do not use floor polish or wax that makes floors slippery. If you must use wax, use non-skid floor wax. Do not have throw rugs and other things on the floor that can make you trip. What can I do with my stairs? Do not leave any items on the stairs. Make sure that there are handrails on both sides of the stairs and use them. Fix handrails that are broken or loose. Make sure that handrails are as long as the stairways. Check any carpeting to make sure that it is firmly attached to the stairs. Fix any carpet that is loose or worn. Avoid having throw  rugs at the top or bottom of the stairs. If you do have throw rugs, attach them to the floor with carpet tape. Make sure that you have a light switch at the top of the stairs and the bottom of the stairs. If you do not have them, ask someone to add them for you. What else can I do to help prevent falls? Wear shoes that: Do not have high heels. Have rubber bottoms. Are comfortable and fit you well. Are closed at the toe. Do not wear sandals. If you use a stepladder: Make sure that it is fully opened. Do not climb a closed stepladder. Make sure that both sides of the stepladder are locked into place. Ask someone to hold it for you, if  possible. Clearly mark and make sure that you can see: Any grab bars or handrails. First and last steps. Where the edge of each step is. Use tools that help you move around (mobility aids) if they are needed. These include: Canes. Walkers. Scooters. Crutches. Turn on the lights when you go into a dark area. Replace any light bulbs as soon as they burn out. Set up your furniture so you have a clear path. Avoid moving your furniture around. If any of your floors are uneven, fix them. If there are any pets around you, be aware of where they are. Review your medicines with your doctor. Some medicines can make you feel dizzy. This can increase your chance of falling. Ask your doctor what other things that you can do to help prevent falls. This information is not intended to replace advice given to you by your health care provider. Make sure you discuss any questions you have with your health care provider. Document Released: 01/04/2009 Document Revised: 08/16/2015 Document Reviewed: 04/14/2014 Elsevier Interactive Patient Education  2017 [employer].

## 2021-12-03 NOTE — Progress Notes (Signed)
Subjective:    Patient ID: Elizabeth Mills, female    DOB: [birth year], 76 y.o.   MRN: [id number]      HPI [place] is here for  Chief Complaint  Patient presents with   Nasal Congestion    Green-yellow mucus, cough, nasal drainage, wheezing    Symptoms started 2 weeks ago.  She had a tickle in her throat, mildly sore throat, PND and the mucus moved down into her lungs.  She keeps wheezing and has a productive cough.  The sputum is mostly white, occasionally yellow- green.  She was SOB until she doubled up on her asthma inhalers, which is helped.    Grandkids had a cough.  They did not have flu or covid.  That's where she got it from.    She is currently taking Advair twice a day. Albuterol 2-3 times a day.  Taking claritin. Took mucinex-needs to get more   Medications and allergies reviewed with patient and updated if appropriate.  Current Outpatient Medications on File Prior to Visit  Medication Sig Dispense Refill   ADVAIR DISKUS 250-50 MCG/ACT AEPB USE 1 INHALATION BY MOUTH  EVERY 12 HOURS 180 each 3   albuterol (PROAIR HFA) 108 (90 Base) MCG/ACT inhaler Inhale 1-2 puffs into the lungs every 4 (four) hours as needed for wheezing or shortness of breath. 34 g 3   estradiol (ESTRACE) 0.1 MG/GM vaginal cream PLACE 1 APPLICATORFUL  VAGINALLY WEEKLY 85 g 3   fluticasone (FLONASE) 50 MCG/ACT nasal spray PLACE 2 SPRAYS INTO BOTH NOSTRILS DAILY AS NEEDED FOR ALLERGIES OR RHINITIS (ALLERGIES). 16 mL 1   loratadine (CLARITIN) 10 MG tablet Take 10 mg by mouth daily as needed for allergies (allergies).     metoprolol succinate (TOPROL-XL) 25 MG 24 hr tablet TAKE ONE-HALF TABLET BY  MOUTH DAILY , MAY ALSO TAKE AN EXTRA ONE-HALF TABLET BY MOUTH DAILY AS NEEDED FOR  PALPITATIONS 90 tablet 2   rosuvastatin (CRESTOR) 40 MG tablet Take 1 tablet (40 mg total) by mouth 3 (three) times a week. 36 tablet 3   SYNTHROID 75 MCG tablet TAKE 1 TABLET BY MOUTH  DAILY BEFORE BREAKFAST 90 tablet 3    VITAMIN D, CHOLECALCIFEROL, PO Patient take 2 gummies (2,000 units total) by mouth daily     Biotin 300 MCG TABS Taking daily (Patient not taking: Reported on 12/03/2021)  0   No current facility-administered medications on file prior to visit.    Review of Systems  Constitutional:  Negative for fever.  HENT:  Positive for postnasal drip. Negative for congestion, sinus pressure, sinus pain and sore throat.   Respiratory:  Positive for cough, shortness of breath and wheezing.   Gastrointestinal:  Negative for diarrhea and nausea.  Neurological:  Negative for dizziness, light-headedness and headaches.       Objective:   Vitals:   12/03/21 1109  BP: 118/60  Pulse: 79  Temp: 98.2 F (36.8 C)  SpO2: 95%   BP Readings from Last 3 Encounters:  12/03/21 118/60  12/03/21 118/60  05/08/21 112/70   Wt Readings from Last 3 Encounters:  12/03/21 135 lb (61.2 kg)  12/03/21 138 lb 3.2 oz (62.7 kg)  05/08/21 138 lb 12.8 oz (63 kg)   Body mass index is 24.69 kg/m.    Physical Exam Constitutional:      General: She is not in acute distress.    Appearance: Normal appearance. She is not ill-appearing.  HENT:     Head: Normocephalic  and atraumatic.     Right Ear: Tympanic membrane, ear canal and external ear normal.     Left Ear: Tympanic membrane, ear canal and external ear normal.     Mouth/Throat:     Mouth: Mucous membranes are moist.     Pharynx: No oropharyngeal exudate or posterior oropharyngeal erythema.  Eyes:     Conjunctiva/sclera: Conjunctivae normal.  Cardiovascular:     Rate and Rhythm: Normal rate and regular rhythm.  Pulmonary:     Effort: Pulmonary effort is normal. No respiratory distress.     Breath sounds: Wheezing (Heard 1 or 2 very fine wheezes and mildly coarse breath sounds) present. No rales.  Musculoskeletal:     Cervical back: Neck supple. No tenderness.  Lymphadenopathy:     Cervical: No cervical adenopathy.  Skin:    General: Skin is warm and  dry.  Neurological:     Mental Status: She is alert.            Assessment & Plan:    See Problem List for Assessment and Plan of chronic medical problems.

## 2021-12-08 ENCOUNTER — Encounter: Payer: Self-pay | Admitting: Internal Medicine

## 2021-12-08 NOTE — Progress Notes (Unsigned)
      Subjective:    Patient ID: Elizabeth Mills, female    DOB: 30-Aug-1945, 76 y.o.   MRN: [id number]     HPI Elizabeth Mills is here for follow up of her chronic medical problems, including hypothyroid, asthma, vaginal atrophy, PSVT, hld  I saw her last week for bronchitis with asthma exac.    Medications and allergies reviewed with patient and updated if appropriate.  Current Outpatient Medications on File Prior to Visit  Medication Sig Dispense Refill   ADVAIR DISKUS 250-50 MCG/ACT AEPB USE 1 INHALATION BY MOUTH  EVERY 12 HOURS 180 each 3   albuterol (PROAIR HFA) 108 (90 Base) MCG/ACT inhaler Inhale 1-2 puffs into the lungs every 4 (four) hours as needed for wheezing or shortness of breath. 34 g 3   amoxicillin-clavulanate (AUGMENTIN) 875-125 MG tablet Take 1 tablet by mouth 2 (two) times daily. 20 tablet 0   clotrimazole (MYCELEX) 10 MG troche Take 1 tablet (10 mg total) by mouth 5 (five) times daily for 8 days. 40 tablet 0   estradiol (ESTRACE) 0.1 MG/GM vaginal cream PLACE 1 APPLICATORFUL  VAGINALLY WEEKLY 85 g 3   fluticasone (FLONASE) 50 MCG/ACT nasal spray PLACE 2 SPRAYS INTO BOTH NOSTRILS DAILY AS NEEDED FOR ALLERGIES OR RHINITIS (ALLERGIES). 16 mL 1   loratadine (CLARITIN) 10 MG tablet Take 10 mg by mouth daily as needed for allergies (allergies).     metoprolol succinate (TOPROL-XL) 25 MG 24 hr tablet TAKE ONE-HALF TABLET BY  MOUTH DAILY , MAY ALSO TAKE AN EXTRA ONE-HALF TABLET BY MOUTH DAILY AS NEEDED FOR  PALPITATIONS 90 tablet 2   rosuvastatin (CRESTOR) 40 MG tablet Take 1 tablet (40 mg total) by mouth 3 (three) times a week. 36 tablet 3   SYNTHROID 75 MCG tablet TAKE 1 TABLET BY MOUTH  DAILY BEFORE BREAKFAST 90 tablet 3   VITAMIN D, CHOLECALCIFEROL, PO Patient take 2 gummies (2,000 units total) by mouth daily     No current facility-administered medications on file prior to visit.     Review of Systems     Objective:  There were no vitals filed for this  visit. BP Readings from Last 3 Encounters:  12/03/21 118/60  12/03/21 118/60  05/08/21 112/70   Wt Readings from Last 3 Encounters:  12/03/21 135 lb (61.2 kg)  12/03/21 138 lb 3.2 oz (62.7 kg)  05/08/21 138 lb 12.8 oz (63 kg)   There is no height or weight on file to calculate BMI.    Physical Exam     Lab Results  Component Value Date   WBC 4.9 05/10/2021   HGB 14.8 05/10/2021   HCT 43.2 05/10/2021   PLT 284.0 05/10/2021   GLUCOSE 102 (H) 05/10/2021   CHOL 250 (H) 05/10/2021   TRIG 159.0 (H) 05/10/2021   HDL 80.30 05/10/2021   LDLDIRECT 94.2 07/28/2012   LDLCALC 137 (H) 05/10/2021   ALT 13 05/10/2021   AST 19 05/10/2021   NA 140 05/10/2021   K 4.1 05/10/2021   CL 102 05/10/2021   CREATININE 0.73 05/10/2021   BUN 15 05/10/2021   CO2 36 (H) 05/10/2021   TSH 1.88 05/10/2021   HGBA1C 5.5 10/28/2017     Assessment & Plan:    See Problem List for Assessment and Plan of chronic medical problems.

## 2021-12-08 NOTE — Patient Instructions (Addendum)
Blood work was ordered.     Medications changes include :   prednisone 40 mg daily x 5 days   Your prescription(s) have been sent to your pharmacy.   A referral was ordered for podiatry.     Return in about 6 months (around 06/12/2022) for follow up, Schedule DEXA-[place].    Health Maintenance, Female Adopting a healthy lifestyle and getting preventive care are important in promoting health and wellness. Ask your health care provider about: The right schedule for you to have regular tests and exams. Things you can do on your own to prevent diseases and keep yourself healthy. What should I know about diet, weight, and exercise? Eat a healthy diet  Eat a diet that includes plenty of vegetables, fruits, low-fat dairy products, and lean protein. Do not eat a lot of foods that are high in solid fats, added sugars, or sodium. Maintain a healthy weight Body mass index (BMI) is used to identify weight problems. It estimates body fat based on height and weight. Your health care provider can help determine your BMI and help you achieve or maintain a healthy weight. Get regular exercise Get regular exercise. This is one of the most important things you can do for your health. Most adults should: Exercise for at least 150 minutes each week. The exercise should increase your heart rate and make you sweat (moderate-intensity exercise). Do strengthening exercises at least twice a week. This is in addition to the moderate-intensity exercise. Spend less time sitting. Even light physical activity can be beneficial. Watch cholesterol and blood lipids Have your blood tested for lipids and cholesterol at 76 years of age, then have this test every 5 years. Have your cholesterol levels checked more often if: Your lipid or cholesterol levels are high. You are older than 76 years of age. You are at high risk for heart disease. What should I know about cancer screening? Depending on your health  history and family history, you may need to have cancer screening at various ages. This may include screening for: Breast cancer. Cervical cancer. Colorectal cancer. Skin cancer. Lung cancer. What should I know about heart disease, diabetes, and high blood pressure? Blood pressure and heart disease High blood pressure causes heart disease and increases the risk of stroke. This is more likely to develop in people who have high blood pressure readings or are overweight. Have your blood pressure checked: Every 3-5 years if you are 20-55 years of age. Every year if you are 76 years old or older. Diabetes Have regular diabetes screenings. This checks your fasting blood sugar level. Have the screening done: Once every three years after age 76 if you are at a normal weight and have a low risk for diabetes. More often and at a younger age if you are overweight or have a high risk for diabetes. What should I know about preventing infection? Hepatitis B If you have a higher risk for hepatitis B, you should be screened for this virus. Talk with your health care provider to find out if you are at risk for hepatitis B infection. Hepatitis C Testing is recommended for: Everyone born from [birth year] through 1965. Anyone with known risk factors for hepatitis C. Sexually transmitted infections (STIs) Get screened for STIs, including gonorrhea and chlamydia, if: You are sexually active and are younger than 76 years of age. You are older than 76 years of age and your health care provider tells you that you are at risk for  this type of infection. Your sexual activity has changed since you were last screened, and you are at increased risk for chlamydia or gonorrhea. Ask your health care provider if you are at risk. Ask your health care provider about whether you are at high risk for HIV. Your health care provider may recommend a prescription medicine to help prevent HIV infection. If you choose to take medicine to  prevent HIV, you should first get tested for HIV. You should then be tested every 3 months for as long as you are taking the medicine. Pregnancy If you are about to stop having your period (premenopausal) and you may become pregnant, seek counseling before you get pregnant. Take 400 to 800 micrograms (mcg) of folic acid every day if you become pregnant. Ask for birth control (contraception) if you want to prevent pregnancy. Osteoporosis and menopause Osteoporosis is a disease in which the bones lose minerals and strength with aging. This can result in bone fractures. If you are 76 years old or older, or if you are at risk for osteoporosis and fractures, ask your health care provider if you should: Be screened for bone loss. Take a calcium or vitamin D supplement to lower your risk of fractures. Be given hormone replacement therapy (HRT) to treat symptoms of menopause. Follow these instructions at home: Alcohol use Do not drink alcohol if: Your health care provider tells you not to drink. You are pregnant, may be pregnant, or are planning to become pregnant. If you drink alcohol: Limit how much you have to: 0-1 drink a day. Know how much alcohol is in your drink. In the [place] one drink equals one 12 oz bottle of beer (355 mL), one 5 oz glass of wine (148 mL), or one 1 oz glass of hard liquor (44 mL). Lifestyle Do not use any products that contain nicotine or tobacco. These products include cigarettes, chewing tobacco, and vaping devices, such as e-cigarettes. If you need help quitting, ask your health care provider. Do not use street drugs. Do not share needles. Ask your health care provider for help if you need support or information about quitting drugs. General instructions Schedule regular health, dental, and eye exams. Stay current with your vaccines. Tell your health care provider if: You often feel depressed. You have ever been abused or do not feel safe at  home. Summary Adopting a healthy lifestyle and getting preventive care are important in promoting health and wellness. Follow your health care provider's instructions about healthy diet, exercising, and getting tested or screened for diseases. Follow your health care provider's instructions on monitoring your cholesterol and blood pressure. This information is not intended to replace advice given to you by your health care provider. Make sure you discuss any questions you have with your health care provider. Document Revised: 07/30/2020 Document Reviewed: 07/30/2020 Elsevier Patient Education  2023 [employer].

## 2021-12-12 ENCOUNTER — Ambulatory Visit (INDEPENDENT_AMBULATORY_CARE_PROVIDER_SITE_OTHER): Payer: Medicare Other | Admitting: Internal Medicine

## 2021-12-12 ENCOUNTER — Ambulatory Visit (INDEPENDENT_AMBULATORY_CARE_PROVIDER_SITE_OTHER): Payer: Medicare Other

## 2021-12-12 VITALS — BP 108/64 | HR 77 | Temp 98.0°F | Ht 62.0 in | Wt 136.2 lb

## 2021-12-12 DIAGNOSIS — N200 Calculus of kidney: Secondary | ICD-10-CM

## 2021-12-12 DIAGNOSIS — N952 Postmenopausal atrophic vaginitis: Secondary | ICD-10-CM

## 2021-12-12 DIAGNOSIS — E7800 Pure hypercholesterolemia, unspecified: Secondary | ICD-10-CM | POA: Diagnosis not present

## 2021-12-12 DIAGNOSIS — L659 Nonscarring hair loss, unspecified: Secondary | ICD-10-CM

## 2021-12-12 DIAGNOSIS — L600 Ingrowing nail: Secondary | ICD-10-CM

## 2021-12-12 DIAGNOSIS — E039 Hypothyroidism, unspecified: Secondary | ICD-10-CM

## 2021-12-12 DIAGNOSIS — I471 Supraventricular tachycardia: Secondary | ICD-10-CM | POA: Diagnosis not present

## 2021-12-12 DIAGNOSIS — Z0000 Encounter for general adult medical examination without abnormal findings: Secondary | ICD-10-CM | POA: Diagnosis not present

## 2021-12-12 DIAGNOSIS — M8589 Other specified disorders of bone density and structure, multiple sites: Secondary | ICD-10-CM

## 2021-12-12 DIAGNOSIS — J4520 Mild intermittent asthma, uncomplicated: Secondary | ICD-10-CM

## 2021-12-12 LAB — LIPID PANEL
Cholesterol: 191 mg/dL (ref 0–200)
HDL: 78.2 mg/dL (ref >39.00)
LDL Cholesterol: 93 mg/dL (ref 0–99)
NonHDL: 112.84
Total CHOL/HDL Ratio: 2
Triglycerides: 98 mg/dL (ref 0.0–149.0)
VLDL: 19.6 mg/dL (ref 0.0–40.0)

## 2021-12-12 LAB — VITAMIN D 25 HYDROXY (VIT D DEFICIENCY, FRACTURES): VITD: 29.56 ng/mL — ABNORMAL LOW (ref 30.00–100.00)

## 2021-12-12 LAB — COMPREHENSIVE METABOLIC PANEL
ALT: 19 U/L (ref 0–35)
AST: 20 U/L (ref 0–37)
Albumin: 3.9 g/dL (ref 3.5–5.2)
Alkaline Phosphatase: 38 U/L — ABNORMAL LOW (ref 39–117)
BUN: 10 mg/dL (ref 6–23)
CO2: 33 mEq/L — ABNORMAL HIGH (ref 19–32)
Calcium: 9.3 mg/dL (ref 8.4–10.5)
Chloride: 102 mEq/L (ref 96–112)
Creatinine, Ser: 0.71 mg/dL (ref 0.40–1.20)
GFR: 82.74 mL/min (ref >60.00)
Glucose, Bld: 96 mg/dL (ref 70–99)
Potassium: 3.8 mEq/L (ref 3.5–5.1)
Sodium: 139 mEq/L (ref 135–145)
Total Bilirubin: 0.6 mg/dL (ref 0.2–1.2)
Total Protein: 7.3 g/dL (ref 6.0–8.3)

## 2021-12-12 LAB — CBC WITH DIFFERENTIAL/PLATELET
Basophils Absolute: 0 10*3/uL (ref 0.0–0.1)
Basophils Relative: 0.7 % (ref 0.0–3.0)
Eosinophils Absolute: 0.5 10*3/uL (ref 0.0–0.7)
Eosinophils Relative: 8.2 % — ABNORMAL HIGH (ref 0.0–5.0)
HCT: 42.5 % (ref 36.0–46.0)
Hemoglobin: 14.6 g/dL (ref 12.0–15.0)
Lymphocytes Relative: 29.9 % (ref 12.0–46.0)
Lymphs Abs: 1.9 10*3/uL (ref 0.7–4.0)
MCHC: 34.3 g/dL (ref 30.0–36.0)
MCV: 90.3 fl (ref 78.0–100.0)
Monocytes Absolute: 0.7 10*3/uL (ref 0.1–1.0)
Monocytes Relative: 10.4 % (ref 3.0–12.0)
Neutro Abs: 3.3 10*3/uL (ref 1.4–7.7)
Neutrophils Relative %: 50.8 % (ref 43.0–77.0)
Platelets: 286 10*3/uL (ref 150.0–400.0)
RBC: 4.7 Mil/uL (ref 3.87–5.11)
RDW: 13.5 % (ref 11.5–15.5)
WBC: 6.4 10*3/uL (ref 4.0–10.5)

## 2021-12-12 LAB — TSH: TSH: 2.32 u[IU]/mL (ref 0.35–5.50)

## 2021-12-12 MED ORDER — PREDNISONE 20 MG PO TABS: 40.0000 mg | ORAL_TABLET | Freq: Every day | ORAL | 0 refills | Status: AC

## 2021-12-12 NOTE — Assessment & Plan Note (Addendum)
Chronic  hair loss, gaining weight Check tsh and will titrate med dose if needed Currently taking Synthroid 75 mcg daily

## 2021-12-12 NOTE — Assessment & Plan Note (Signed)
Intermittent Some difficulty urinating at times - improves with lemon water Will check KUB

## 2021-12-12 NOTE — Assessment & Plan Note (Signed)
Chronic Regular exercise and healthy diet encouraged Check lipid panel  Continue Crestor 40 mg 3 times a week

## 2021-12-12 NOTE — Assessment & Plan Note (Signed)
Chronic Controlled Continue metoprolol XL 12.5 mg daily

## 2021-12-12 NOTE — Assessment & Plan Note (Addendum)
Chronic - Mild, persistent With current exacerbation - improved after abx, but still with coughing and wheezing and using albuterol 3-4 times a day Start prednisone 40 mg daily x 5 days Continue Advair 250-50 inhaler twice daily Continue ProAir inhaler as needed

## 2021-12-12 NOTE — Assessment & Plan Note (Signed)
Chronic Continue Estrace vaginal cream

## 2021-12-12 NOTE — Assessment & Plan Note (Signed)
Chronic dexa due- ordered Walks regularly for exercise, advsied weights Start calcium 500-600 mg bid Continue vitamin d  Check vitamin d level

## 2021-12-12 NOTE — Assessment & Plan Note (Addendum)
Still having hair loss Diffuse Will check tsh - it was normal 6 months ago B12 was normal ? Medication related Can consider taking b complex or hair, skin nails vitamin Can consider derm referral

## 2021-12-19 ENCOUNTER — Ambulatory Visit: Payer: Medicare Other | Admitting: Podiatry

## 2021-12-26 ENCOUNTER — Ambulatory Visit (INDEPENDENT_AMBULATORY_CARE_PROVIDER_SITE_OTHER): Payer: Medicare Other | Admitting: Podiatry

## 2021-12-26 DIAGNOSIS — L600 Ingrowing nail: Secondary | ICD-10-CM | POA: Diagnosis not present

## 2021-12-26 NOTE — Progress Notes (Signed)
Subjective:  Patient ID: Elizabeth Mills, female    DOB: [birth year],  MRN: [id number]  Chief Complaint  Patient presents with   Ingrown Toenail    76 y.o. female presents with the above complaint.  Patient presents with complaint left hallux lateral border ingrown painful to touch is progressive gotten worse worse with ambulation hurts with pressure she states that she goes to nail salon and may have tried to remove some of it but would like to discuss removal options she denies seeing anyone else prior to seeing me pain scale 7 out of 10.  Review of Systems: Negative except as noted in the HPI. Denies N/V/F/Ch.  Past Medical History:  Diagnosis Date   Allergic rhinitis, cause unspecified    Asthma    Back pain    Colitis    Diverticulitis    Generalized headaches    GERD (gastroesophageal reflux disease)    Hypercholesterolemia    Hypothyroidism    LVH (left ventricular hypertrophy)    PSVT (paroxysmal supraventricular tachycardia) (HCC)    Statin intolerance     Current Outpatient Medications:    ADVAIR DISKUS 250-50 MCG/ACT AEPB, USE 1 INHALATION BY MOUTH  EVERY 12 HOURS, Disp: 180 each, Rfl: 3   albuterol (PROAIR HFA) 108 (90 Base) MCG/ACT inhaler, Inhale 1-2 puffs into the lungs every 4 (four) hours as needed for wheezing or shortness of breath., Disp: 34 g, Rfl: 3   amoxicillin-clavulanate (AUGMENTIN) 875-125 MG tablet, Take 1 tablet by mouth 2 (two) times daily., Disp: 20 tablet, Rfl: 0   estradiol (ESTRACE) 0.1 MG/GM vaginal cream, PLACE 1 APPLICATORFUL  VAGINALLY WEEKLY, Disp: 85 g, Rfl: 3   fluticasone (FLONASE) 50 MCG/ACT nasal spray, PLACE 2 SPRAYS INTO BOTH NOSTRILS DAILY AS NEEDED FOR ALLERGIES OR RHINITIS (ALLERGIES)., Disp: 16 mL, Rfl: 1   loratadine (CLARITIN) 10 MG tablet, Take 10 mg by mouth daily as needed for allergies (allergies)., Disp: , Rfl:    metoprolol succinate (TOPROL-XL) 25 MG 24 hr tablet, TAKE ONE-HALF TABLET BY  MOUTH DAILY , MAY ALSO TAKE  AN EXTRA ONE-HALF TABLET BY MOUTH DAILY AS NEEDED FOR  PALPITATIONS, Disp: 90 tablet, Rfl: 2   rosuvastatin (CRESTOR) 40 MG tablet, Take 1 tablet (40 mg total) by mouth 3 (three) times a week., Disp: 36 tablet, Rfl: 3   SYNTHROID 75 MCG tablet, TAKE 1 TABLET BY MOUTH  DAILY BEFORE BREAKFAST, Disp: 90 tablet, Rfl: 3   VITAMIN D, CHOLECALCIFEROL, PO, Patient take 2 gummies (2,000 units total) by mouth daily, Disp: , Rfl:   Social History   Tobacco Use  Smoking Status Never  Smokeless Tobacco Never  Tobacco Comments   married, lives with spouse - retired    Allergies  Allergen Reactions   Crestor [Rosuvastatin Calcium] Other (See Comments)    Gi issues   Lipitor [Atorvastatin Calcium]     Gi symp   Phenergan [Promethazine Hcl] Other (See Comments)    Father and son are highly allergic so patient doesn't take it   Pravachol Other (See Comments)    Gi issues   Promethazine Other (See Comments)    Other reaction(s): Other (See Comments) Father and son have AMS with Phenergan - patient afraid to take Father and son have AMS with Phenergan - patient afraid to take   Promethazine Hcl    Sulfa Antibiotics Other (See Comments)    dizziness   Sulfamethoxazole Other (See Comments)    Other reaction(s): Dizziness (intolerance) Disoriented Disoriented   Zetia [  Ezetimibe] Other (See Comments)    Gi issues   Zocor [Simvastatin - High Dose] Other (See Comments)    Gi issues   Pseudoephedrine Palpitations   Objective:  There were no vitals filed for this visit. There is no height or weight on file to calculate BMI. Constitutional Well developed. Well nourished.  Vascular Dorsalis pedis pulses palpable bilaterally. Posterior tibial pulses palpable bilaterally. Capillary refill normal to all digits.  No cyanosis or clubbing noted. Pedal hair growth normal.  Neurologic Normal speech. Oriented to person, place, and time. Epicritic sensation to light touch grossly present bilaterally.   Dermatologic Painful ingrowing nail at lateral nail borders of the hallux nail left. No other open wounds. No skin lesions.  Orthopedic: Normal joint ROM without pain or crepitus bilaterally. No visible deformities. No bony tenderness.   Radiographs: None Assessment:   1. Ingrown left big toenail    Plan:  Patient was evaluated and treated and all questions answered.  Ingrown Nail, left -Patient elects to proceed with minor surgery to remove ingrown toenail removal today. Consent reviewed and signed by patient. -Ingrown nail excised. See procedure note. -Educated on post-procedure care including soaking. Written instructions provided and reviewed. -Patient to follow up in 2 weeks for nail check.  Procedure: Excision of Ingrown Toenail Location: Left 1st toe lateral nail borders. Anesthesia: Lidocaine 1% plain; 1.5 mL and Marcaine 0.5% plain; 1.5 mL, digital block. Skin Prep: Betadine. Dressing: Silvadene; telfa; dry, sterile, compression dressing. Technique: Following skin prep, the toe was exsanguinated and a tourniquet was secured at the base of the toe. The affected nail border was freed, split with a nail splitter, and excised. Chemical matrixectomy was then performed with phenol and irrigated out with alcohol. The tourniquet was then removed and sterile dressing applied. Disposition: Patient tolerated procedure well. Patient to return in 2 weeks for follow-up.   No follow-ups on file.

## 2021-12-27 ENCOUNTER — Telehealth: Payer: Medicare Other | Admitting: Physician Assistant

## 2021-12-27 DIAGNOSIS — B9689 Other specified bacterial agents as the cause of diseases classified elsewhere: Secondary | ICD-10-CM

## 2021-12-27 DIAGNOSIS — J019 Acute sinusitis, unspecified: Secondary | ICD-10-CM

## 2021-12-27 MED ORDER — PREDNISONE 20 MG PO TABS: 40.0000 mg | ORAL_TABLET | Freq: Every day | ORAL | 0 refills | Status: DC

## 2021-12-27 MED ORDER — DOXYCYCLINE HYCLATE 100 MG PO TABS: 100.0000 mg | ORAL_TABLET | Freq: Two times a day (BID) | ORAL | 0 refills | Status: DC

## 2021-12-27 NOTE — Patient Instructions (Signed)
Elizabeth Mills, thank you for joining Elizabeth Loveless, PA-C for today's virtual visit.  While this provider is not your primary care provider (PCP), if your PCP is located in our provider database this encounter information will be shared with them immediately following your visit.  Consent: (Patient) Elizabeth Mills provided verbal consent for this virtual visit at the beginning of the encounter.  Current Medications:  Current Outpatient Medications:    doxycycline (VIBRA-TABS) 100 MG tablet, Take 1 tablet (100 mg total) by mouth 2 (two) times daily., Disp: 20 tablet, Rfl: 0   predniSONE (DELTASONE) 20 MG tablet, Take 2 tablets (40 mg total) by mouth daily with breakfast., Disp: 14 tablet, Rfl: 0   ADVAIR DISKUS 250-50 MCG/ACT AEPB, USE 1 INHALATION BY MOUTH  EVERY 12 HOURS, Disp: 180 each, Rfl: 3   albuterol (PROAIR HFA) 108 (90 Base) MCG/ACT inhaler, Inhale 1-2 puffs into the lungs every 4 (four) hours as needed for wheezing or shortness of breath., Disp: 34 g, Rfl: 3   estradiol (ESTRACE) 0.1 MG/GM vaginal cream, PLACE 1 APPLICATORFUL  VAGINALLY WEEKLY, Disp: 85 g, Rfl: 3   fluticasone (FLONASE) 50 MCG/ACT nasal spray, PLACE 2 SPRAYS INTO BOTH NOSTRILS DAILY AS NEEDED FOR ALLERGIES OR RHINITIS (ALLERGIES)., Disp: 16 mL, Rfl: 1   loratadine (CLARITIN) 10 MG tablet, Take 10 mg by mouth daily as needed for allergies (allergies)., Disp: , Rfl:    metoprolol succinate (TOPROL-XL) 25 MG 24 hr tablet, TAKE ONE-HALF TABLET BY  MOUTH DAILY , MAY ALSO TAKE AN EXTRA ONE-HALF TABLET BY MOUTH DAILY AS NEEDED FOR  PALPITATIONS, Disp: 90 tablet, Rfl: 2   rosuvastatin (CRESTOR) 40 MG tablet, Take 1 tablet (40 mg total) by mouth 3 (three) times a week., Disp: 36 tablet, Rfl: 3   SYNTHROID 75 MCG tablet, TAKE 1 TABLET BY MOUTH  DAILY BEFORE BREAKFAST, Disp: 90 tablet, Rfl: 3   VITAMIN D, CHOLECALCIFEROL, PO, Patient take 2 gummies (2,000 units total) by mouth daily, Disp: , Rfl:     Medications ordered in this encounter:  Meds ordered this encounter  Medications   doxycycline (VIBRA-TABS) 100 MG tablet    Sig: Take 1 tablet (100 mg total) by mouth 2 (two) times daily.    Dispense:  20 tablet    Refill:  0    Order Specific Question:   Supervising Provider    Answer:   Merrilee Jansky [[id number]]   predniSONE (DELTASONE) 20 MG tablet    Sig: Take 2 tablets (40 mg total) by mouth daily with breakfast.    Dispense:  14 tablet    Refill:  0    Order Specific Question:   Supervising Provider    Answer:   Merrilee Jansky [id number]     *If you need refills on other medications prior to your next appointment, please contact your pharmacy*  Follow-Up: Call back or seek an in-person evaluation if the symptoms worsen or if the condition fails to improve as anticipated.   Virtual Care [phone]  Other Instructions  Sinus Infection, Adult A sinus infection, also called sinusitis, is inflammation of your sinuses. Sinuses are hollow spaces in the bones around your face. Your sinuses are located: Around your eyes. In the middle of your forehead. Behind your nose. In your cheekbones. Mucus normally drains out of your sinuses. When your nasal tissues become inflamed or swollen, mucus can become trapped or blocked. This allows bacteria, viruses, and fungi to grow, which leads to  infection. Most infections of the sinuses are caused by a virus. A sinus infection can develop quickly. It can last for up to 4 weeks (acute) or for more than 12 weeks (chronic). A sinus infection often develops after a cold. What are the causes? This condition is caused by anything that creates swelling in the sinuses or stops mucus from draining. This includes: Allergies. Asthma. Infection from bacteria or viruses. Deformities or blockages in your nose or sinuses. Abnormal growths in the nose (nasal polyps). Pollutants, such as chemicals or irritants in the air. Infection  from fungi. This is rare. What increases the risk? You are more likely to develop this condition if you: Have a weak body defense system (immune system). Do a lot of swimming or diving. Overuse nasal sprays. Smoke. What are the signs or symptoms? The main symptoms of this condition are pain and a feeling of pressure around the affected sinuses. Other symptoms include: Stuffy nose or congestion that makes it difficult to breathe through your nose. Thick yellow or greenish drainage from your nose. Tenderness, swelling, and warmth over the affected sinuses. A cough that may get worse at night. Decreased sense of smell and taste. Extra mucus that collects in the throat or the back of the nose (postnasal drip) causing a sore throat or bad breath. Tiredness (fatigue). Fever. How is this diagnosed? This condition is diagnosed based on: Your symptoms. Your medical history. A physical exam. Tests to find out if your condition is acute or chronic. This may include: Checking your nose for nasal polyps. Viewing your sinuses using a device that has a light (endoscope). Testing for allergies or bacteria. Imaging tests, such as an MRI or CT scan. In rare cases, a bone biopsy may be done to rule out more serious types of fungal sinus disease. How is this treated? Treatment for a sinus infection depends on the cause and whether your condition is chronic or acute. If caused by a virus, your symptoms should go away on their own within 10 days. You may be given medicines to relieve symptoms. They include: Medicines that shrink swollen nasal passages (decongestants). A spray that eases inflammation of the nostrils (topical intranasal corticosteroids). Rinses that help get rid of thick mucus in your nose (nasal saline washes). Medicines that treat allergies (antihistamines). Over-the-counter pain relievers. If caused by bacteria, your health care provider may recommend waiting to see if your symptoms  improve. Most bacterial infections will get better without antibiotic medicine. You may be given antibiotics if you have: A severe infection. A weak immune system. If caused by narrow nasal passages or nasal polyps, surgery may be needed. Follow these instructions at home: Medicines Take, use, or apply over-the-counter and prescription medicines only as told by your health care provider. These may include nasal sprays. If you were prescribed an antibiotic medicine, take it as told by your health care provider. Do not stop taking the antibiotic even if you start to feel better. Hydrate and humidify  Drink enough fluid to keep your urine pale yellow. Staying hydrated will help to thin your mucus. Use a cool mist humidifier to keep the humidity level in your home above 50%. Inhale steam for 10-15 minutes, 3-4 times a day, or as told by your health care provider. You can do this in the bathroom while a hot shower is running. Limit your exposure to cool or dry air. Rest Rest as much as possible. Sleep with your head raised (elevated). Make sure you get enough  sleep each night. General instructions  Apply a warm, moist washcloth to your face 3-4 times a day or as told by your health care provider. This will help with discomfort. Use nasal saline washes as often as told by your health care provider. Wash your hands often with soap and water to reduce your exposure to germs. If soap and water are not available, use hand sanitizer. Do not smoke. Avoid being around people who are smoking (secondhand smoke). Keep all follow-up visits. This is important. Contact a health care provider if: You have a fever. Your symptoms get worse. Your symptoms do not improve within 10 days. Get help right away if: You have a severe headache. You have persistent vomiting. You have severe pain or swelling around your face or eyes. You have vision problems. You develop confusion. Your neck is stiff. You have  trouble breathing. These symptoms may be an emergency. Get help right away. Call 911. Do not wait to see if the symptoms will go away. Do not drive yourself to the hospital. Summary A sinus infection is soreness and inflammation of your sinuses. Sinuses are hollow spaces in the bones around your face. This condition is caused by nasal tissues that become inflamed or swollen. The swelling traps or blocks the flow of mucus. This allows bacteria, viruses, and fungi to grow, which leads to infection. If you were prescribed an antibiotic medicine, take it as told by your health care provider. Do not stop taking the antibiotic even if you start to feel better. Keep all follow-up visits. This is important. This information is not intended to replace advice given to you by your health care provider. Make sure you discuss any questions you have with your health care provider. Document Revised: 02/12/2021 Document Reviewed: 02/12/2021 Elsevier Patient Education  2023 Elsevier Inc.    If you have been instructed to have an in-person evaluation today at a local Urgent Care facility, please use the link below. It will take you to a list of all of our available [facility], including address, phone number and hours of operation. Please do not delay care.  [facility]  If you or a family member do not have a primary care provider, use the link below to schedule a visit and establish care. When you choose a [facility] primary care physician or advanced practice provider, you gain a long-term partner in health. Find a Primary Care Provider  Learn more about [facility]'s in-office and virtual care options:  - Get Care Now

## 2021-12-27 NOTE — Progress Notes (Signed)
Virtual Visit Consent   Elizabeth Mills, you are scheduled for a virtual visit with a [facility] provider today. Just as with appointments in the office, your consent must be obtained to participate. Your consent will be active for this visit and any virtual visit you may have with one of our providers in the next 365 days. If you have a MyChart account, a copy of this consent can be sent to you electronically.  As this is a virtual visit, video technology does not allow for your provider to perform a traditional examination. This may limit your provider's ability to fully assess your condition. If your provider identifies any concerns that need to be evaluated in person or the need to arrange testing (such as labs, EKG, etc.), we will make arrangements to do so. Although advances in technology are sophisticated, we cannot ensure that it will always work on either your end or our end. If the connection with a video visit is poor, the visit may have to be switched to a telephone visit. With either a video or telephone visit, we are not always able to ensure that we have a secure connection.  By engaging in this virtual visit, you consent to the provision of healthcare and authorize for your insurance to be billed (if applicable) for the services provided during this visit. Depending on your insurance coverage, you may receive a charge related to this service.  I need to obtain your verbal consent now. Are you willing to proceed with your visit today? Elizabeth Mills has provided verbal consent on 12/27/2021 for a virtual visit (video or telephone). Elizabeth Loveless, PA-C  Date: 12/27/2021 11:12 AM  Virtual Visit via Video Note   I, Elizabeth Mills, connected with  Elizabeth Mills  ([id number], 1945-11-29) on 12/27/21 at 11:00 AM EDT by a video-enabled telemedicine application and verified that I am speaking with the correct person using two identifiers.  Location: Patient:  Virtual Visit Location Patient: Home Provider: Virtual Visit Location Provider: Home Office   I discussed the limitations of evaluation and management by telemedicine and the availability of in person appointments. The patient expressed understanding and agreed to proceed.    History of Present Illness: Elizabeth Mills is a 76 y.o. who identifies as a female who was assigned female at birth, and is being seen today for sinus congestion and cough.  HPI: Sinusitis This is a new problem. The current episode started 1 to 4 weeks ago (3-4 weeks ago; completed Augmentin but did not have complete resolution; then given Steroids for 5 days, had some improvements, but now symptoms started to worsen on Sunday). The problem has been gradually worsening since onset. Maximum temperature: 99.5 just today. The fever has been present for Less than 1 day. Associated symptoms include chills, congestion, coughing, ear pain (mild), headaches, sinus pressure and a sore throat (initially but improved). Pertinent negatives include no hoarse voice. (Nausea today) Treatments tried: salt water gargles, saline nasal rinse, mucinex, ibuprofen. The treatment provided mild relief.     Problems:  Patient Active Problem List   Diagnosis Date Noted   Asthmatic bronchitis with acute exacerbation 12/03/2021   Hair loss 05/08/2021   Vaginal atrophy 05/06/2021   PSVT (paroxysmal supraventricular tachycardia) 02/29/2020   Occipital neuralgia of right side 04/27/2019   Left elbow pain 04/05/2019   Claudication (HCC) 01/15/2017   Nephrolithiasis 09/22/2016   Osteopenia 06/27/2015   Amblyopia of right eye 03/07/2014   Nuclear sclerosis of  both eyes 03/07/2014   Hip bursitis 01/25/2014   Alopecia 07/28/2012   Nuclear cataract 02/04/2012   Asthma    Allergic rhinitis    Hypercholesterolemia    Back pain    Colitis    Hypothyroidism     Allergies:  Allergies  Allergen Reactions   Crestor [Rosuvastatin Calcium]  Other (See Comments)    Gi issues   Lipitor [Atorvastatin Calcium]     Gi symp   Phenergan [Promethazine Hcl] Other (See Comments)    Father and son are highly allergic so patient doesn't take it   Pravachol Other (See Comments)    Gi issues   Promethazine Other (See Comments)    Other reaction(s): Other (See Comments) Father and son have AMS with Phenergan - patient afraid to take Father and son have AMS with Phenergan - patient afraid to take   Promethazine Hcl    Sulfa Antibiotics Other (See Comments)    dizziness   Sulfamethoxazole Other (See Comments)    Other reaction(s): Dizziness (intolerance) Disoriented Disoriented   Zetia [Ezetimibe] Other (See Comments)    Gi issues   Zocor [Simvastatin - High Dose] Other (See Comments)    Gi issues   Pseudoephedrine Palpitations   Medications:  Current Outpatient Medications:    doxycycline (VIBRA-TABS) 100 MG tablet, Take 1 tablet (100 mg total) by mouth 2 (two) times daily., Disp: 20 tablet, Rfl: 0   predniSONE (DELTASONE) 20 MG tablet, Take 2 tablets (40 mg total) by mouth daily with breakfast., Disp: 14 tablet, Rfl: 0   ADVAIR DISKUS 250-50 MCG/ACT AEPB, USE 1 INHALATION BY MOUTH  EVERY 12 HOURS, Disp: 180 each, Rfl: 3   albuterol (PROAIR HFA) 108 (90 Base) MCG/ACT inhaler, Inhale 1-2 puffs into the lungs every 4 (four) hours as needed for wheezing or shortness of breath., Disp: 34 g, Rfl: 3   estradiol (ESTRACE) 0.1 MG/GM vaginal cream, PLACE 1 APPLICATORFUL  VAGINALLY WEEKLY, Disp: 85 g, Rfl: 3   fluticasone (FLONASE) 50 MCG/ACT nasal spray, PLACE 2 SPRAYS INTO BOTH NOSTRILS DAILY AS NEEDED FOR ALLERGIES OR RHINITIS (ALLERGIES)., Disp: 16 mL, Rfl: 1   loratadine (CLARITIN) 10 MG tablet, Take 10 mg by mouth daily as needed for allergies (allergies)., Disp: , Rfl:    metoprolol succinate (TOPROL-XL) 25 MG 24 hr tablet, TAKE ONE-HALF TABLET BY  MOUTH DAILY , MAY ALSO TAKE AN EXTRA ONE-HALF TABLET BY MOUTH DAILY AS NEEDED FOR   PALPITATIONS, Disp: 90 tablet, Rfl: 2   rosuvastatin (CRESTOR) 40 MG tablet, Take 1 tablet (40 mg total) by mouth 3 (three) times a week., Disp: 36 tablet, Rfl: 3   SYNTHROID 75 MCG tablet, TAKE 1 TABLET BY MOUTH  DAILY BEFORE BREAKFAST, Disp: 90 tablet, Rfl: 3   VITAMIN D, CHOLECALCIFEROL, PO, Patient take 2 gummies (2,000 units total) by mouth daily, Disp: , Rfl:   Observations/Objective: Patient is well-developed, well-nourished in no acute distress.  Resting comfortably at home.  Head is normocephalic, atraumatic.  No labored breathing.  Speech is clear and coherent with logical content.  Patient is alert and oriented at baseline.    Assessment and Plan: 1. Acute bacterial sinusitis - doxycycline (VIBRA-TABS) 100 MG tablet; Take 1 tablet (100 mg total) by mouth 2 (two) times daily.  Dispense: 20 tablet; Refill: 0 - predniSONE (DELTASONE) 20 MG tablet; Take 2 tablets (40 mg total) by mouth daily with breakfast.  Dispense: 14 tablet; Refill: 0  - Worsening symptoms; Failed augmentin and prednisone - Will give  Doxycycline and Prednisone - Continue allergy medications.  - Steam and humidifier can help - Stay well hydrated and get plenty of rest.  - Seek in person evaluation if no symptom improvement or if symptoms worsen   Follow Up Instructions: I discussed the assessment and treatment plan with the patient. The patient was provided an opportunity to ask questions and all were answered. The patient agreed with the plan and demonstrated an understanding of the instructions.  A copy of instructions were sent to the patient via MyChart unless otherwise noted below.    The patient was advised to call back or seek an in-person evaluation if the symptoms worsen or if the condition fails to improve as anticipated.  Time:  I spent 12 minutes with the patient via telehealth technology discussing the above problems/concerns.    Elizabeth Loveless, PA-C

## 2022-01-20 ENCOUNTER — Encounter: Payer: Self-pay | Admitting: Internal Medicine

## 2022-01-20 NOTE — Progress Notes (Signed)
    Subjective:    Patient ID: Elizabeth Mills, female    DOB: 1945/12/10, 75 y.o.   MRN: [id number]      HPI Elizabeth Mills is here for No chief complaint on file.    Cough worsening - had video visit 10/6 and diagnosed with bacterial sinusitis.  Prescribed doxycycline x 10 days and prednisone 40 mg daily x 7 days. She has been using her Advair twice a day and albuterol as needed.     Medications and allergies reviewed with patient and updated if appropriate.  Current Outpatient Medications on File Prior to Visit  Medication Sig Dispense Refill   ADVAIR DISKUS 250-50 MCG/ACT AEPB USE 1 INHALATION BY MOUTH  EVERY 12 HOURS 180 each 3   albuterol (PROAIR HFA) 108 (90 Base) MCG/ACT inhaler Inhale 1-2 puffs into the lungs every 4 (four) hours as needed for wheezing or shortness of breath. 34 g 3   doxycycline (VIBRA-TABS) 100 MG tablet Take 1 tablet (100 mg total) by mouth 2 (two) times daily. 20 tablet 0   estradiol (ESTRACE) 0.1 MG/GM vaginal cream PLACE 1 APPLICATORFUL  VAGINALLY WEEKLY 85 g 3   fluticasone (FLONASE) 50 MCG/ACT nasal spray PLACE 2 SPRAYS INTO BOTH NOSTRILS DAILY AS NEEDED FOR ALLERGIES OR RHINITIS (ALLERGIES). 16 mL 1   loratadine (CLARITIN) 10 MG tablet Take 10 mg by mouth daily as needed for allergies (allergies).     metoprolol succinate (TOPROL-XL) 25 MG 24 hr tablet TAKE ONE-HALF TABLET BY  MOUTH DAILY , MAY ALSO TAKE AN EXTRA ONE-HALF TABLET BY MOUTH DAILY AS NEEDED FOR  PALPITATIONS 90 tablet 2   predniSONE (DELTASONE) 20 MG tablet Take 2 tablets (40 mg total) by mouth daily with breakfast. 14 tablet 0   rosuvastatin (CRESTOR) 40 MG tablet Take 1 tablet (40 mg total) by mouth 3 (three) times a week. 36 tablet 3   SYNTHROID 75 MCG tablet TAKE 1 TABLET BY MOUTH  DAILY BEFORE BREAKFAST 90 tablet 3   VITAMIN D, CHOLECALCIFEROL, PO Patient take 2 gummies (2,000 units total) by mouth daily     No current facility-administered medications on file prior to visit.     Review of Systems     Objective:  There were no vitals filed for this visit. BP Readings from Last 3 Encounters:  12/12/21 108/64  12/03/21 118/60  12/03/21 118/60   Wt Readings from Last 3 Encounters:  12/12/21 136 lb 3.2 oz (61.8 kg)  12/03/21 135 lb (61.2 kg)  12/03/21 138 lb 3.2 oz (62.7 kg)   There is no height or weight on file to calculate BMI.    Physical Exam         Assessment & Plan:    See Problem List for Assessment and Plan of chronic medical problems.

## 2022-01-21 ENCOUNTER — Ambulatory Visit: Payer: Medicare Other | Admitting: Internal Medicine

## 2022-01-21 ENCOUNTER — Ambulatory Visit (INDEPENDENT_AMBULATORY_CARE_PROVIDER_SITE_OTHER): Payer: Medicare Other

## 2022-01-21 VITALS — BP 116/68 | HR 81 | Temp 98.1°F | Ht 62.0 in | Wt 139.6 lb

## 2022-01-21 DIAGNOSIS — B370 Candidal stomatitis: Secondary | ICD-10-CM | POA: Diagnosis not present

## 2022-01-21 DIAGNOSIS — J189 Pneumonia, unspecified organism: Secondary | ICD-10-CM | POA: Diagnosis not present

## 2022-01-21 DIAGNOSIS — J45901 Unspecified asthma with (acute) exacerbation: Secondary | ICD-10-CM | POA: Diagnosis not present

## 2022-01-21 MED ORDER — AMOXICILLIN-POT CLAVULANATE 875-125 MG PO TABS: 1.0000 | ORAL_TABLET | Freq: Two times a day (BID) | ORAL | 0 refills | Status: AC

## 2022-01-21 MED ORDER — NYSTATIN 100000 UNIT/ML MT SUSP: 5.0000 mL | Freq: Four times a day (QID) | OROMUCOSAL | 0 refills | Status: DC

## 2022-01-21 NOTE — Assessment & Plan Note (Signed)
Acute Was treated with clotrimazole with partial improvement Symptoms that she has now still suggest active thrush Nystatin swish and spit 4 times daily

## 2022-01-21 NOTE — Patient Instructions (Addendum)
    Have a chest xray today.      Medications changes include :  nystatin for possible thrush.  augmentin     Return if symptoms worsen or fail to improve.

## 2022-01-21 NOTE — Assessment & Plan Note (Signed)
Subacute Related to recent illness which is community-acquired pneumonia She is using her albuterol at least 3 times a day Continue Advair twice a day Continue albuterol as needed Since we are doing an antibiotic for residual pneumonia we will hold off on prednisone since her lungs are clear unless the use of her albuterol and other symptoms or not improving with antibiotic

## 2022-01-21 NOTE — Assessment & Plan Note (Signed)
Subacute Symptoms consistent with community-acquired pneumonia Partially treated-she still having some symptoms that indicates not completely treated pneumonia Lungs are clear so no need for steroids at this time We will get chest x-ray Start Augmentin 875-125 mg twice daily x7 days Continue Advair 250-50 mcg/ACT twice daily, albuterol as needed Over-the-counter cold medications as needed, allergy medications Call if no improvement

## 2022-01-24 ENCOUNTER — Encounter: Payer: Self-pay | Admitting: Physician Assistant

## 2022-01-24 NOTE — Progress Notes (Unsigned)
Cardiology Office Note    Date:  01/27/2022   ID:  Elizabeth Mills, Elizabeth Mills 23-Dec-1945, MRN [id number]  PCP:  Binnie Rail, MD  Cardiologist:  Sinclair Grooms, MD  Electrophysiologist:  None   Chief Complaint: f/u SVT  History of Present Illness:   Elizabeth Mills is a 76 y.o. female with history of palpitations/PSVT per Dr. Sherryl Barters remote note, HLD (intolerant of higher intensity statins), hypothyroidism, asthma, colitis, GERD, prior LVH (not seen on recent study), aortic valve sclerosis presents for routine cardiology follow-up.   Remote echo 2008 showed mild-mod LVH, EF normal, + impared relaxation, mild aortic valve sclerosis without stenosis. I met her in 2019 for evaluation of shortness of breath without obvious cardiac etiology. 2D echo 12/10/17 showed EF 34-19%, normal diastolic parameters, mild TR, PASP upper limits of normal. Symptoms resolved spontaneously so further testing was deferred. She has done well over the years. She was recently seen by primary care and treated for CAP.  She returns for follow-up today. With the exception of her prolonged respiratory tract infection, she has largely been doing well without recent breakthrough SVT. She does recall having 2 brief episodes of vague chest cramping over the last few weeks without associated symptom. The episodes were associated with eating spicy food and lasted only a brief minute or so. It resolved spontaneously. The last episode was several days ago. She's not had any exertional angina. She had dyspnea when she was in the thick of her continued URI but she reports this has improved with antibiotic therapy. O2 sat is normal and she reports feeling well today, though still has occasional productive cough every few days.   Labwork independently reviewed: 11/2021 K 3.8, Cr 0.71, LFTs wnl, CBC wnl, LDL 93, trig 98, TSH wnl  Cardiology Studies:   Studies reviewed are outlined and summarized above. Reports included  below if pertinent.   2D echo 11/2017  - Left ventricle: The cavity size was normal. Systolic function was    normal. The estimated ejection fraction was in the range of 60%    to 65%. Wall motion was normal; there were no regional wall    motion abnormalities. Left ventricular diastolic function    parameters were normal.  - Aortic valve: There was no regurgitation.  - Mitral valve: There was no regurgitation.  - Right ventricle: The cavity size was normal. Wall thickness was    normal. Systolic function was normal.  - Right atrium: The atrium was normal in size.  - Tricuspid valve: There was mild regurgitation.  - Pulmonary arteries: Systolic pressure was at the upper limits of    normal.  - Inferior vena cava: The vessel was normal in size.  - Pericardium, extracardiac: There was no pericardial effusion.     Past Medical History:  Diagnosis Date   Allergic rhinitis, cause unspecified    Asthma    Back pain    Colitis    Diverticulitis    Generalized headaches    GERD (gastroesophageal reflux disease)    Hypercholesterolemia    Hypothyroidism    LVH (left ventricular hypertrophy)    Mild tricuspid regurgitation    PSVT (paroxysmal supraventricular tachycardia)    Statin intolerance     Past Surgical History:  Procedure Laterality Date   [place]   BREAST EXCISIONAL BIOPSY Bilateral    COLONOSCOPY  EYE SURGERY  11/14/2009, 1990   blocked tear duct repair   TONSILLECTOMY  1953    Current Medications: Current Meds  Medication Sig   ADVAIR DISKUS 250-50 MCG/ACT AEPB USE 1 INHALATION BY MOUTH  EVERY 12 HOURS   albuterol (PROAIR HFA) 108 (90 Base) MCG/ACT inhaler Inhale 1-2 puffs into the lungs every 4 (four) hours as needed for wheezing or shortness of breath.   amoxicillin-clavulanate (AUGMENTIN) 875-125 MG tablet Take 1 tablet by mouth 2 (two) times daily for 7 days.   estradiol  (ESTRACE) 0.1 MG/GM vaginal cream PLACE 1 APPLICATORFUL  VAGINALLY WEEKLY   fluticasone (FLONASE) 50 MCG/ACT nasal spray PLACE 2 SPRAYS INTO BOTH NOSTRILS DAILY AS NEEDED FOR ALLERGIES OR RHINITIS (ALLERGIES).   loratadine (CLARITIN) 10 MG tablet Take 10 mg by mouth daily as needed for allergies (allergies).   metoprolol succinate (TOPROL-XL) 25 MG 24 hr tablet TAKE ONE-HALF TABLET BY  MOUTH DAILY , MAY ALSO TAKE AN EXTRA ONE-HALF TABLET BY MOUTH DAILY AS NEEDED FOR  PALPITATIONS   nystatin (MYCOSTATIN) 100000 UNIT/ML suspension Take 5 mLs (500,000 Units total) by mouth 4 (four) times daily. Use for 10 days   rosuvastatin (CRESTOR) 40 MG tablet Take 1 tablet (40 mg total) by mouth 3 (three) times a week.   SYNTHROID 75 MCG tablet TAKE 1 TABLET BY MOUTH  DAILY BEFORE BREAKFAST   VITAMIN D, CHOLECALCIFEROL, PO Take 3,000 Units by mouth daily. Patient take 2 gummies (2,000 units total) by mouth daily     Allergies:   Crestor [rosuvastatin calcium], Lipitor [atorvastatin calcium], Phenergan [promethazine hcl], Pravachol, Promethazine, Promethazine hcl, Sulfa antibiotics, Sulfamethoxazole, Zetia [ezetimibe], Zocor [simvastatin - high dose], and Pseudoephedrine   Social History   Socioeconomic History   Marital status: Married    Spouse name: Not on file   Number of children: 2   Years of education: 13   Highest education level: Not on file  Occupational History   Occupation: Homemaker  Tobacco Use   Smoking status: Never   Smokeless tobacco: Never   Tobacco comments:    married, lives with spouse - retired  Scientific laboratory technician Use: Never used  Substance and Sexual Activity   Alcohol use: No   Drug use: No   Sexual activity: Not Currently  Other Topics Concern   Not on file  Social History Narrative   Regular exercise-yes, walk 2-3 times a week   Caffeine Use-no   Social Determinants of Health   Financial Resource Strain: Low Risk  (12/03/2021)   Overall Financial Resource Strain  (CARDIA)    Difficulty of Paying Living Expenses: Not hard at all  Food Insecurity: No Food Insecurity (12/03/2021)   Hunger Vital Sign    Worried About Running Out of Food in the Last Year: Never true    Ran Out of Food in the Last Year: Never true  Transportation Needs: No Transportation Needs (12/03/2021)   PRAPARE - Hydrologist (Medical): No    Lack of Transportation (Non-Medical): No  Physical Activity: Sufficiently Active (12/03/2021)   Exercise Vital Sign    Days of Exercise per Week: 5 days    Minutes of Exercise per Session: 30 min  Stress: No Stress Concern Present (12/03/2021)   [place]    Feeling of Stress : Not at all  Social Connections: Socially Integrated (12/03/2021)   Social Connection and Isolation Panel [NHANES]    Frequency of  Communication with Friends and Family: More than three times a week    Frequency of Social Gatherings with Friends and Family: More than three times a week    Attends Religious Services: More than 4 times per year    Active Member of [employer] or Organizations: Yes    Attends Music therapist: More than 4 times per year    Marital Status: Married     Family History:  The patient's family history includes Arthritis in her father and mother; Cancer in her mother; Hyperlipidemia in her father and mother. There is no history of Breast cancer.  ROS:   Please see the history of present illness.  All other systems are reviewed and otherwise negative.    EKG(s)/Additional Labs   EKG:  EKG is ordered today, personally reviewed, demonstrating NSR 84bpm, lower voltage, no acute STT changes  Recent Labs: 12/12/2021: ALT 19; BUN 10; Creatinine, Ser 0.71; Hemoglobin 14.6; Platelets 286.0; Potassium 3.8; Sodium 139; TSH 2.32  Recent Lipid Panel    Component Value Date/Time   CHOL 191 12/12/2021 1053   CHOL 162 01/25/2019 1040   TRIG 98.0  12/12/2021 1053   HDL 78.20 12/12/2021 1053   HDL 75 01/25/2019 1040   CHOLHDL 2 12/12/2021 1053   VLDL 19.6 12/12/2021 1053   LDLCALC 93 12/12/2021 1053   LDLCALC 65 01/25/2019 1040   LDLDIRECT 94.2 07/28/2012 1010    PHYSICAL EXAM:    VS:  BP 118/72   Pulse 84   Ht _0  (1.575 m)   Wt 139 lb (63 kg)   SpO2 98%   BMI 25.42 kg/m   BMI: Body mass index is 25.42 kg/m.  GEN: Well nourished, well developed female in no acute distress HEENT: normocephalic, atraumatic Neck: no JVD, carotid bruits, or masses Cardiac: RRR; no murmurs, rubs, or gallops, no edema  Respiratory:  clear to auscultation bilaterally, normal work of breathing GI: soft, nontender, nondistended, + BS MS: no deformity or atrophy Skin: warm and dry, no rash Neuro:  Alert and Oriented x 3, Strength and sensation are intact, follows commands Psych: euthymic mood, full affect  Wt Readings from Last 3 Encounters:  01/27/22 139 lb (63 kg)  01/21/22 139 lb 9.6 oz (63.3 kg)  12/12/21 136 lb 3.2 oz (61.8 kg)     ASSESSMENT & PLAN:   1. PSVT - quiescent on metoprolol, continue.  2. Hyperlipidemia - last lipids reviewed 11/2021, LDL improved from prior, tolerating rosuvastatin at present dose - continue.  3. Mild TR - no recent symptoms. Continue conservative approach. Consider repeat echo at next visit.  4. Atypical chest pain - atypical for angina, occurred in setting of spicy food, very short lived. EKG without acute change from prior. Symptoms were not associated with tachycardia or exertion. Given short duration and atypical nature, we'll continue to monitor for now. She will notify if episodes recur or worsen in which case I would suggest we consider coronary CTA. Lexiscan less ideal with recent wheezing.    Disposition: F/u with Dr. Tamala Julian (replacement upon retirement) or me in 1 year, sooner if needed.   Medication Adjustments/Labs and Tests Ordered: Current medicines are reviewed at length with the  patient today.  Concerns regarding medicines are outlined above. Medication changes, Labs and Tests ordered today are summarized above and listed in the Patient Instructions accessible in Encounters.   Signed, Charlie Pitter, PA-C  01/27/2022 3:30 PM    [place] Phone: [phone]; Fax: [phone]  [phone]

## 2022-01-27 ENCOUNTER — Encounter: Payer: Self-pay | Admitting: Physician Assistant

## 2022-01-27 ENCOUNTER — Ambulatory Visit: Payer: Medicare Other | Attending: Physician Assistant | Admitting: Physician Assistant

## 2022-01-27 VITALS — BP 118/72 | HR 84 | Ht 62.0 in | Wt 139.0 lb

## 2022-01-27 DIAGNOSIS — R0789 Other chest pain: Secondary | ICD-10-CM

## 2022-01-27 DIAGNOSIS — E785 Hyperlipidemia, unspecified: Secondary | ICD-10-CM | POA: Diagnosis not present

## 2022-01-27 DIAGNOSIS — I471 Supraventricular tachycardia, unspecified: Secondary | ICD-10-CM | POA: Diagnosis not present

## 2022-01-27 DIAGNOSIS — I071 Rheumatic tricuspid insufficiency: Secondary | ICD-10-CM | POA: Diagnosis not present

## 2022-01-27 NOTE — Patient Instructions (Signed)
Medication Instructions:   Your physician recommends that you continue on your current medications as directed. Please refer to the Current Medication list given to you today.   *If you need a refill on your cardiac medications before your next appointment, please call your pharmacy*   Lab Work: [place]    If you have labs (blood work) drawn today and your tests are completely normal, you will receive your results only by: [place] (if you have MyChart) OR A paper copy in the mail If you have any lab test that is abnormal or we need to change your treatment, we will call you to review the results.   Testing/Procedures: NONE ORDERED  TODAY   Follow-Up: At [facility], you and your health needs are our priority.  As part of our continuing mission to provide you with exceptional heart care, we have created designated Provider Care Teams.  These Care Teams include your primary Cardiologist (physician) and Advanced Practice Providers (APPs -  Physician Assistants and Nurse Practitioners) who all work together to provide you with the care you need, when you need it.  We recommend signing up for the patient portal called "MyChart".  Sign up information is provided on this After Visit Summary.  MyChart is used to connect with patients for Virtual Visits (Telemedicine).  Patients are able to view lab/test results, encounter notes, upcoming appointments, etc.  Non-urgent messages can be sent to your provider as well.   To learn more about what you can do with MyChart, go to [email or web address].    Your next appointment:   1 year(s)  The format for your next appointment:   In Person  Provider:   Melina Copa, PA-C  or  Sinclair Grooms, MD ( [place] )will plan to see you again in 1 year(s).    Other Instructions    Important Information About Sugar

## 2022-02-17 ENCOUNTER — Other Ambulatory Visit: Payer: Self-pay

## 2022-02-17 ENCOUNTER — Telehealth: Payer: Self-pay | Admitting: Internal Medicine

## 2022-02-17 MED ORDER — SYNTHROID 75 MCG PO TABS: ORAL_TABLET | ORAL | 3 refills | Status: DC

## 2022-02-17 NOTE — Telephone Encounter (Signed)
Patient is needing is synthroid 75 mcg - Please send to [facility]  Last appt.01/21/2022

## 2022-02-17 NOTE — Telephone Encounter (Signed)
Sent in today 

## 2022-02-26 ENCOUNTER — Other Ambulatory Visit: Payer: Self-pay | Admitting: Internal Medicine

## 2022-02-26 ENCOUNTER — Other Ambulatory Visit: Payer: Self-pay

## 2022-04-05 ENCOUNTER — Other Ambulatory Visit: Payer: Self-pay

## 2022-04-05 ENCOUNTER — Emergency Department (HOSPITAL_COMMUNITY)
Admission: EM | Admit: 2022-04-05 | Discharge: 2022-04-05 | Disposition: A | Discharge status: Home / Self Care | Payer: Medicare Other | Attending: Emergency Medicine | Admitting: Emergency Medicine

## 2022-04-05 ENCOUNTER — Emergency Department (HOSPITAL_COMMUNITY): Payer: Medicare Other

## 2022-04-05 DIAGNOSIS — N200 Calculus of kidney: Secondary | ICD-10-CM | POA: Diagnosis not present

## 2022-04-05 DIAGNOSIS — R8271 Bacteriuria: Secondary | ICD-10-CM | POA: Diagnosis not present

## 2022-04-05 DIAGNOSIS — I10 Essential (primary) hypertension: Secondary | ICD-10-CM | POA: Diagnosis not present

## 2022-04-05 DIAGNOSIS — R319 Hematuria, unspecified: Secondary | ICD-10-CM | POA: Diagnosis present

## 2022-04-05 DIAGNOSIS — Z79899 Other long term (current) drug therapy: Secondary | ICD-10-CM | POA: Diagnosis not present

## 2022-04-05 LAB — URINALYSIS, ROUTINE W REFLEX MICROSCOPIC
Bilirubin Urine: NEGATIVE
Glucose, UA: NEGATIVE mg/dL
Ketones, ur: NEGATIVE mg/dL
Leukocytes,Ua: NEGATIVE
Nitrite: NEGATIVE
Protein, ur: NEGATIVE mg/dL
RBC / HPF: >50 RBC/hpf — ABNORMAL HIGH (ref 0–5)
Specific Gravity, Urine: 1.01 (ref 1.005–1.030)
pH: 8 (ref 5.0–8.0)

## 2022-04-05 LAB — BASIC METABOLIC PANEL
Anion gap: 9 (ref 5–15)
BUN: 15 mg/dL (ref 8–23)
CO2: 29 mmol/L (ref 22–32)
Calcium: 9 mg/dL (ref 8.9–10.3)
Chloride: 101 mmol/L (ref 98–111)
Creatinine, Ser: 0.8 mg/dL (ref 0.44–1.00)
GFR, Estimated: >60 mL/min (ref >60)
Glucose, Bld: 105 mg/dL — ABNORMAL HIGH (ref 70–99)
Potassium: 4.1 mmol/L (ref 3.5–5.1)
Sodium: 139 mmol/L (ref 135–145)

## 2022-04-05 LAB — CBC
HCT: 41.6 % (ref 36.0–46.0)
Hemoglobin: 14.1 g/dL (ref 12.0–15.0)
MCH: 31 pg (ref 26.0–34.0)
MCHC: 33.9 g/dL (ref 30.0–36.0)
MCV: 91.4 fL (ref 80.0–100.0)
Platelets: 270 10*3/uL (ref 150–400)
RBC: 4.55 MIL/uL (ref 3.87–5.11)
RDW: 12.8 % (ref 11.5–15.5)
WBC: 5.1 10*3/uL (ref 4.0–10.5)
nRBC: 0 % (ref 0.0–0.2)

## 2022-04-05 MED ORDER — ACETAMINOPHEN 325 MG PO TABS
650.0000 mg | ORAL_TABLET | Freq: Once | ORAL | Status: AC
  Administered 2022-04-05: 650 mg via ORAL
  Filled 2022-04-05: qty 2

## 2022-04-05 MED ORDER — ONDANSETRON HCL 4 MG PO TABS: 4.0000 mg | ORAL_TABLET | Freq: Four times a day (QID) | ORAL | 0 refills | Status: AC

## 2022-04-05 MED ORDER — CEFADROXIL 500 MG PO CAPS: 500.0000 mg | ORAL_CAPSULE | Freq: Two times a day (BID) | ORAL | 0 refills | Status: DC

## 2022-04-05 MED ORDER — OXYCODONE-ACETAMINOPHEN 5-325 MG PO TABS: 1.0000 | ORAL_TABLET | Freq: Four times a day (QID) | ORAL | 0 refills | Status: DC | PRN

## 2022-04-05 MED ORDER — TAMSULOSIN HCL 0.4 MG PO CAPS: 0.4000 mg | ORAL_CAPSULE | Freq: Every day | ORAL | 0 refills | Status: DC

## 2022-04-05 MED ORDER — ONDANSETRON HCL 4 MG PO TABS: 4.0000 mg | ORAL_TABLET | Freq: Four times a day (QID) | ORAL | 0 refills | Status: DC

## 2022-04-05 MED ORDER — TAMSULOSIN HCL 0.4 MG PO CAPS: 0.4000 mg | ORAL_CAPSULE | Freq: Every day | ORAL | 0 refills | Status: AC

## 2022-04-05 NOTE — ED Provider Triage Note (Signed)
Emergency Medicine Provider Triage Evaluation Note  Elizabeth Mills , a 77 y.o. female  was evaluated in triage.  Pt complains of right flank pain, hematuria, does have previous hx stones, reports this is slightly less painful than last time she did have a kidney stone, she denies any nausea, vomiting, fever, chills.  She does not take a blood thinner but does report occasional paroxysmal A-fib, she takes metoprolol.  Review of Systems  Positive: Flank pain, hematuria Negative: NVD, fever, chills  Physical Exam  BP 136/74 (BP Location: Right Arm)   Pulse 91   Temp 98 F (36.7 C) (Oral)   Resp 14   Ht 5\' 2"  (1.575 m)   Wt 63 kg   SpO2 93%   BMI 25.40 kg/m  Gen:   Awake, no distress   Resp:  Normal effort  MSK:   Moves extremities without difficulty  Other:  Mild ttp in right flank, no CVA ttp  Medical Decision Making  Medically screening exam initiated at 2:20 PM.  Appropriate orders placed.  Elizabeth Mills was informed that the remainder of the evaluation will be completed by another provider, this initial triage assessment does not replace that evaluation, and the importance of remaining in the ED until their evaluation is complete.  Workup initiated   Anselmo Pickler, [place] 04/05/22 1421

## 2022-04-05 NOTE — ED Notes (Signed)
Urine sent to the lab.

## 2022-04-05 NOTE — ED Provider Notes (Signed)
[facility] DEPT Provider Note   CSN: [id number] Arrival date & time: 04/05/22  1312     History  Chief Complaint  Patient presents with   Hematuria    Elizabeth Mills is a 77 y.o. female Pt complains of right flank pain, hematuria, does have previous hx stones, reports this is slightly less painful than last time she did have a kidney stone, she denies any nausea, vomiting, fever, chills.  She does not take a blood thinner but does report occasional paroxysmal A-fib, she takes metoprolol.    Hematuria       Home Medications Prior to Admission medications   Medication Sig Start Date End Date Taking? Authorizing Provider  ADVAIR DISKUS 250-50 MCG/ACT AEPB USE 1 INHALATION BY MOUTH  EVERY 12 HOURS 02/26/22   Burns, Claudina Lick, MD  albuterol (PROAIR HFA) 108 (90 Base) MCG/ACT inhaler Inhale 1-2 puffs into the lungs every 4 (four) hours as needed for wheezing or shortness of breath. 07/23/21   Binnie Rail, MD  cefadroxil (DURICEF) 500 MG capsule Take 1 capsule (500 mg total) by mouth 2 (two) times daily. 04/05/22   Jaesean Litzau H, PA-C  estradiol (ESTRACE) 0.1 MG/GM vaginal cream PLACE 1 APPLICATORFUL  VAGINALLY WEEKLY 01/16/21   Burns, Claudina Lick, MD  fluticasone (FLONASE) 50 MCG/ACT nasal spray PLACE 2 SPRAYS INTO BOTH NOSTRILS DAILY AS NEEDED FOR ALLERGIES OR RHINITIS (ALLERGIES). 11/02/19   Binnie Rail, MD  loratadine (CLARITIN) 10 MG tablet Take 10 mg by mouth daily as needed for allergies (allergies).    [provider]  metoprolol succinate (TOPROL-XL) 25 MG 24 hr tablet TAKE ONE-HALF TABLET BY  MOUTH DAILY , MAY ALSO TAKE AN EXTRA ONE-HALF TABLET BY MOUTH DAILY AS NEEDED FOR  PALPITATIONS 03/29/21   Belva Crome, MD  nystatin (MYCOSTATIN) 100000 UNIT/ML suspension Take 5 mLs (500,000 Units total) by mouth 4 (four) times daily. Use for 10 days 01/21/22   Binnie Rail, MD  ondansetron (ZOFRAN) 4 MG tablet Take 1 tablet (4 mg total)  by mouth every 6 (six) hours. 04/05/22   Lizet Kelso H, PA-C  oxyCODONE-acetaminophen (PERCOCET/ROXICET) 5-325 MG tablet Take 1 tablet by mouth every 6 (six) hours as needed for severe pain. 04/05/22   Sahar Ryback H, PA-C  rosuvastatin (CRESTOR) 40 MG tablet Take 1 tablet (40 mg total) by mouth 3 (three) times a week. 04/05/21   Belva Crome, MD  SYNTHROID 75 MCG tablet TAKE 1 TABLET BY MOUTH  DAILY BEFORE BREAKFAST 02/17/22   Binnie Rail, MD  tamsulosin (FLOMAX) 0.4 MG CAPS capsule Take 1 capsule (0.4 mg total) by mouth daily. 04/05/22   Abbigael Detlefsen H, PA-C  VITAMIN D, CHOLECALCIFEROL, PO Take 3,000 Units by mouth daily. Patient take 2 gummies (2,000 units total) by mouth daily    [provider]      Allergies    Crestor [rosuvastatin calcium], Lipitor [atorvastatin calcium], Phenergan [promethazine hcl], Pravachol, Promethazine, Promethazine hcl, Sulfa antibiotics, Sulfamethoxazole, Zetia [ezetimibe], Zocor [simvastatin - high dose], and Pseudoephedrine    Review of Systems   Review of Systems  Genitourinary:  Positive for hematuria.  All other systems reviewed and are negative.   Physical Exam Updated Vital Signs BP (!) 140/78 (BP Location: Right Arm)   Pulse 93   Temp 98.6 F (37 C) (Oral)   Resp 16   Ht _0  (1.575 m)   Wt 63 kg   SpO2 95%   BMI  25.40 kg/m  Physical Exam Vitals and nursing note reviewed.  Constitutional:      General: She is not in acute distress.    Appearance: Normal appearance.  HENT:     Head: Normocephalic and atraumatic.  Eyes:     General:        Right eye: No discharge.        Left eye: No discharge.  Cardiovascular:     Rate and Rhythm: Normal rate and regular rhythm.     Heart sounds: No murmur heard.    No friction rub. No gallop.  Pulmonary:     Effort: Pulmonary effort is normal.     Breath sounds: Normal breath sounds.  Abdominal:     General: Bowel sounds are normal.     Palpations: Abdomen is  soft.     Comments: Patient with some right-sided flank pain, but no CVA tenderness, no rebound, rigidity, guarding throughout  Skin:    General: Skin is warm and dry.     Capillary Refill: Capillary refill takes less than 2 seconds.  Neurological:     Mental Status: She is alert and oriented to person, place, and time.  Psychiatric:        Mood and Affect: Mood normal.        Behavior: Behavior normal.     ED Results / Procedures / Treatments   Labs (all labs ordered are listed, but only abnormal results are displayed) Labs Reviewed  URINALYSIS, ROUTINE W REFLEX MICROSCOPIC - Abnormal; Notable for the following components:      Result Value   APPearance CLOUDY (*)    Hgb urine dipstick MODERATE (*)    RBC / HPF >50 (*)    Bacteria, UA RARE (*)    All other components within normal limits  BASIC METABOLIC PANEL - Abnormal; Notable for the following components:   Glucose, Bld 105 (*)    All other components within normal limits  URINE CULTURE  CBC    EKG None  Radiology CT Renal Stone Study  Result Date: 04/05/2022 CLINICAL DATA:  Abdominal/flank pain, stone suspected EXAM: CT ABDOMEN AND PELVIS WITHOUT CONTRAST TECHNIQUE: Multidetector CT imaging of the abdomen and pelvis was performed following the standard protocol without IV contrast. RADIATION DOSE REDUCTION: This exam was performed according to the departmental dose-optimization program which includes automated exposure control, adjustment of the mA and/or kV according to patient size and/or use of iterative reconstruction technique. COMPARISON:  May 28, 2016. FINDINGS: Evaluation is limited by lack of IV contrast. Lower chest: RIGHT middle lobe bronchiectasis with associated consolidative opacity. Scattered bibasilar centrilobular nodularities. Hepatobiliary: Unremarkable noncontrast appearance of the liver and gallbladder. No extrahepatic biliary ductal dilation. Similar appearance of a benign small LEFT hepatic cyst  versus hemangioma (for which no dedicated imaging follow-up is recommended) . Pancreas: No peripancreatic fat stranding. Spleen: Unremarkable. Adrenals/Urinary Tract: Adrenal glands are unremarkable. There is a 9 mm LEFT-sided nephrolithiasis sitting at the ureteropelvic junction. No significant upstream hydronephrosis. There are several additional punctate nonobstructive LEFT-sided nephrolithiasis. No RIGHT-sided nephrolithiasis or hydronephrosis. Bladder is unremarkable. Stomach/Bowel: No evidence of bowel obstruction. Status post appendectomy. Stomach is unremarkable for degree of distension. Vascular/Lymphatic: Severe atherosclerotic calcifications of the nonaneurysmal abdominal aorta. No suspicious lymphadenopathy. Reproductive: Status post hysterectomy. No adnexal masses. Other: No free air or free fluid. Musculoskeletal: Dextrocurvature of the lumbar spine. IMPRESSION: 1. There is a 9 mm LEFT-sided nephrolithiasis sitting at the ureteropelvic junction. No significant upstream hydronephrosis. 2. RIGHT middle lobe bronchiectasis  with associated consolidative opacity and scattered bibasilar centrilobular nodularities. 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 Aortic Atherosclerosis (ICD10-I70.0). Electronically Signed   By: Valentino Saxon M.D.   On: 04/05/2022 16:14    Procedures Procedures    Medications Ordered in ED Medications  acetaminophen (TYLENOL) tablet 650 mg (650 mg Oral Given 04/05/22 1653)    ED Course/ Medical Decision Making/ A&P                             Medical Decision Making Amount and/or Complexity of Data Reviewed Labs: ordered. Radiology: ordered.  Risk OTC drugs. Prescription drug management.   This patient is a 77 y.o. female  who presents to the ED for concern of flank pain, hematuria.   Differential diagnoses prior to evaluation: The emergent differential diagnosis includes, but is not  limited to, UTI, pyelonephritis, nephrolithiasis, diverticulitis, or other intra-abdominal infection, SBO, PID, tubo-ovarian abscess, referred chest pain versus other.. This is not an exhaustive differential.   Past Medical History / Co-morbidities: Previous history of kidney stones, hyperlipidemia, diverticulitis, acid reflux  Additional history: Chart reviewed. Pertinent results include: Reviewed outpatient family medicine, cardiology visits, no recent emergency department visits on file  Physical Exam: Physical exam performed. The pertinent findings include: Patient overall in no acute distress, she has some mild right-sided flank pain, she does negative CVA tenderness bilaterally, her vital signs are overall stable, she is afebrile, she is mildly hypertensive with blood pressure 140/78, she is stable oxygen saturation on room air.   Lab Tests/Imaging studies: I personally interpreted labs/imaging and the pertinent results include: CBC unremarkable, notably no significant leukocytosis or clinically significant anemia.  BMP is unremarkable, notably no evidence of creatinine dysfunction, a urinalysis shows large red blood cells, 6-10 white blood cells, rare bacteria, there are some noted white blood cell clumps and budding yeast..  Independently interpreted CT renal stone study which shows 9 mm UPJ stone on the left without significant hydronephrosis.  No right sided nephrolithiasis noted.  I agree with the radiologist interpretation.  Consultation: Requested consultation with urology, I have been unable to speak with urologist during patient's hospitalization, however based on her clinical presentation, no significant pain, and only questionable bacteria in urine and think that patient would be stable for discharge with urology follow-up, encouraged her to avoid NSAIDs in case they want to treat her 9 mm stone with lithotripsy, will treat prophylactically with cefadroxil to cover for small bacteria  and white blood cell clumps a UA, although overall low clinical suspicion for infected stone.   Medications: I ordered medication including Tylenol for pain.  I have reviewed the patients home medicines and have made adjustments as needed.  Patient discharged with Percocet, Zofran, cefadroxil, Flomax.  Extensive return precautions given for severe worsening pain, urinary retention.   Disposition: After consideration of the diagnostic results and the patients response to treatment, I feel that patient is stable for discharge at this time.   emergency department workup does not suggest an emergent condition requiring admission or immediate intervention beyond what has been performed at this time. The plan is: as above. The patient is safe for discharge and has been instructed to return immediately for worsening symptoms, change in symptoms or any other concerns.  Final Clinical Impression(s) / ED Diagnoses Final diagnoses:  Nephrolithiasis  Bacteriuria    Rx / DC Orders ED Discharge Orders  Ordered    cefadroxil (DURICEF) 500 MG capsule  2 times daily,   Status:  Discontinued        04/05/22 1840    tamsulosin (FLOMAX) 0.4 MG CAPS capsule  Daily,   Status:  Discontinued        04/05/22 1840    oxyCODONE-acetaminophen (PERCOCET/ROXICET) 5-325 MG tablet  Every 6 hours PRN,   Status:  Discontinued        04/05/22 1840    ondansetron (ZOFRAN) 4 MG tablet  Every 6 hours,   Status:  Discontinued        04/05/22 1840    cefadroxil (DURICEF) 500 MG capsule  2 times daily        04/05/22 1841    ondansetron (ZOFRAN) 4 MG tablet  Every 6 hours        04/05/22 1841    oxyCODONE-acetaminophen (PERCOCET/ROXICET) 5-325 MG tablet  Every 6 hours PRN        04/05/22 1841    tamsulosin (FLOMAX) 0.4 MG CAPS capsule  Daily        04/05/22 1841              Anselmo Pickler, PA-C 04/05/22 1918    Varney Biles, MD 04/06/22 1623

## 2022-04-05 NOTE — ED Triage Notes (Signed)
Pt reports blood in urine last night. Was seen at [facility] pta and was advised to come to ED. Pt reports right flank pain.

## 2022-04-05 NOTE — Discharge Instructions (Addendum)
You were seen in the emergency department and found to have a kidney stone.  Please take tylenol as needed for pain.  We are sending you home with multiple medications to assist with passing the stone and for residual pain/nausea:  -Flomax-this is a medication to help pass the stone, it allows urine to exit the body more freely.  Please take this once daily with a meal.  -Percocet-this is a narcotic/controlled substance medication that has potential addicting qualities.  We recommend that you take 1-2 tablets every 6 hours as needed for severe pain.  Do not drive or operate heavy machinery when taking this medicine as it can be sedating. Do not drink alcohol or take other sedating medications when taking this medicine for safety reasons.  Keep this out of reach of small children.  Please be aware this medicine has Tylenol in it (325 mg/tab) do not exceed the maximum dose of Tylenol in a day per over the counter recommendations should you decide to supplement with Tylenol over the counter.   -Zofran-this is an antinausea medication, you may take this every 8 hours as needed for nausea and vomiting, please allow the tablet to dissolve underneath of your tongue.   We have prescribed you new medication(s) today. Discuss the medications prescribed today with your pharmacist as they can have adverse effects and interactions with your other medicines including over the counter and prescribed medications. Seek medical evaluation if you start to experience new or abnormal symptoms after taking one of these medicines, seek care immediately if you start to experience difficulty breathing, feeling of your throat closing, facial swelling, or rash as these could be indications of a more serious allergic reaction  Please follow-up with the urology group provided in your discharge instructions within 3 to 5 days.  Return to the ER for new or worsening symptoms including but not limited to worsening pain not controlled by  these medicines, inability to keep fluids down, fever, or any other concerns that you may have.

## 2022-04-06 LAB — URINE CULTURE: Culture: <10000 — AB

## 2022-04-07 ENCOUNTER — Other Ambulatory Visit: Payer: Self-pay | Admitting: Urology

## 2022-04-08 ENCOUNTER — Encounter (HOSPITAL_COMMUNITY): Payer: Self-pay | Admitting: Urology

## 2022-04-08 ENCOUNTER — Other Ambulatory Visit: Payer: Self-pay

## 2022-04-08 NOTE — Anesthesia Preprocedure Evaluation (Addendum)
Anesthesia Evaluation  Patient identified by MRN, date of birth, ID band Patient awake    Reviewed: Allergy & Precautions, H&P , NPO status , Patient's Chart, lab work & pertinent test results  Airway Mallampati: II  TM Distance: >3 FB Neck ROM: Full    Dental no notable dental hx. (+) Teeth Intact, Dental Advisory Given   Pulmonary asthma    Pulmonary exam normal breath sounds clear to auscultation       Cardiovascular hypertension, Pt. on medications and Pt. on home beta blockers Normal cardiovascular exam Rhythm:Regular Rate:Normal     Neuro/Psych negative neurological ROS  negative psych ROS   GI/Hepatic Neg liver ROS,GERD  ,,  Endo/Other  Hypothyroidism    Renal/GU negative Renal ROS  negative genitourinary   Musculoskeletal negative musculoskeletal ROS (+)    Abdominal   Peds negative pediatric ROS (+)  Hematology negative hematology ROS (+)   Anesthesia Other Findings   Reproductive/Obstetrics negative OB ROS                              Anesthesia Physical Anesthesia Plan  ASA: 2  Anesthesia Plan: General   Post-op Pain Management: Minimal or no pain anticipated   Induction: Intravenous  PONV Risk Score and Plan: 3 and Ondansetron, Dexamethasone and Treatment may vary due to age or medical condition  Airway Management Planned: LMA  Additional Equipment:   Intra-op Plan:   Post-operative Plan: Extubation in OR  Informed Consent: I have reviewed the patients History and Physical, chart, labs and discussed the procedure including the risks, benefits and alternatives for the proposed anesthesia with the patient or authorized representative who has indicated his/her understanding and acceptance.     Dental advisory given  Plan Discussed with: CRNA and Surgeon  Anesthesia Plan Comments: (See APP note by Durel Salts, FNP )        Anesthesia Quick Evaluation

## 2022-04-08 NOTE — Progress Notes (Signed)
Anesthesia Chart Review:   Case: [id number] Date/Time: 04/09/22 1030   Procedure: LEFT URETEROSCOPY/HOLMIUM LASER/STENT PLACEMENT (Left) - 1 HR FOR CASE   Anesthesia type: General   Pre-op diagnosis: LEFT URETERAL STONE   Location: [facility] PROCEDURE ROOM / [facility]   Surgeons: Vira Agar, MD       DISCUSSION: Pt is 77 years old with hx PSVT (noted to be "quiescent on metoprolol"), asthma, anemia  VS: Ht 5' 2.5" (1.588 m)   Wt 62.1 kg   BMI 24.66 kg/m   PROVIDERS: - PCP is Binnie Rail, MD - Cardiologist was Daneen Schick, MD who recently retired. Last office visit 01/27/22 with Melina Copa, PA  LABS: Labs reviewed: Acceptable for surgery. BMP 04/05/22: glucose 105 CBC 04/05/22: normal   IMAGES: CXR 01/21/22:  - Mild left basilar opacities, which could represent atelectasis or early pneumonia.  EKG 01/27/22: NSR 84bpm, lower voltage, no acute STT changes    CV: Echo 12/10/17:  - Left ventricle: The cavity size was normal. Systolic function was normal. The estimated ejection fraction was in the range of 60% to 65%. Wall motion was normal; there were no regional wall motion abnormalities. Left ventricular diastolic function parameters were normal.  - Aortic valve: There was no regurgitation.  - Mitral valve: There was no regurgitation.  - Right ventricle: The cavity size was normal. Wall thickness was normal. Systolic function was normal.  - Right atrium: The atrium was normal in size.  - Tricuspid valve: There was mild regurgitation.  - Pulmonary arteries: Systolic pressure was at the upper limits of normal.  - Inferior vena cava: The vessel was normal in size.  - Pericardium, extracardiac: There was no pericardial effusion.    Past Medical History:  Diagnosis Date   Allergic rhinitis, cause unspecified    Anemia    Arthritis    Asthma    Back pain    Colitis    Diverticulitis    Generalized headaches    GERD (gastroesophageal reflux disease)    History of kidney  stones    Hypercholesterolemia    Hypothyroidism    LVH (left ventricular hypertrophy)    Mild tricuspid regurgitation    Pneumonia    PSVT (paroxysmal supraventricular tachycardia)    Statin intolerance     Past Surgical History:  Procedure Laterality Date   [place]   BREAST EXCISIONAL BIOPSY Bilateral    CATARACT EXTRACTION W/ INTRAOCULAR LENS IMPLANT Bilateral    COLONOSCOPY     EYE SURGERY  11/14/2009, 1990   blocked tear duct repair   INNER EAR SURGERY     TONSILLECTOMY  1953    MEDICATIONS: No current facility-administered medications for this encounter.    ADVAIR DISKUS 250-50 MCG/ACT AEPB   albuterol (PROAIR HFA) 108 (90 Base) MCG/ACT inhaler   Biotin w/ Vitamins C & E (HAIR SKIN & NAILS GUMMIES PO)   cefadroxil (DURICEF) 500 MG capsule   estradiol (ESTRACE) 0.1 MG/GM vaginal cream   fluticasone (FLONASE) 50 MCG/ACT nasal spray   loratadine (CLARITIN) 10 MG tablet   metoprolol succinate (TOPROL-XL) 25 MG 24 hr tablet   rosuvastatin (CRESTOR) 40 MG tablet   SYNTHROID 75 MCG tablet   tamsulosin (FLOMAX) 0.4 MG CAPS capsule   Vitamin D, Cholecalciferol, 25 MCG (1000 UT) TABS   nystatin (MYCOSTATIN) 100000 UNIT/ML suspension   ondansetron (ZOFRAN) 4 MG tablet  oxyCODONE-acetaminophen (PERCOCET/ROXICET) 5-325 MG tablet    If no changes, I anticipate pt can proceed with surgery as scheduled.   Willeen Cass, PhD, FNP-BC [facility]/Anesthesiology Phone: [phone] 04/08/2022 1:47 PM

## 2022-04-08 NOTE — Progress Notes (Addendum)
COVID Vaccine Completed:  Yes  Date of COVID positive in last 90 days:  No  PCP - Billey Gosling, MD Cardiologist - Melina Copa, PA-C  Chest x-ray - 01-21-22 Epic EKG - 01-27-22 Epic Stress Test - 02-03-2006 Epic ECHO - 12-10-17 Epic Cardiac Cath - N/A Pacemaker/ICD device last checked: Spinal Cord Stimulator:  N/A  Bowel Prep - N/A  Sleep Study - N/A CPAP -   Fasting Blood Sugar -  Checks Blood Sugar _____ times a day  Last dose of GLP1 agonist-  N/A GLP1 instructions:  N/A   Last dose of SGLT-2 inhibitors-  N/A SGLT-2 instructions: N/A  Blood Thinner Instructions:  N/A Aspirin Instructions: Last Dose:  Activity level:  Can go up a flight of stairs and perform activities of daily living without stopping and without symptoms of chest pain or shortness of breath.  Anesthesia review:  PSVT, LVH  Pneumonia in November, no residual symptoms  Patient denies shortness of breath, fever, cough and chest pain at PAT appointment  Patient verbalized understanding of instructions that were given to them at the PAT appointment. Patient was also instructed that they will need to review over the PAT instructions again at home before surgery.

## 2022-04-09 ENCOUNTER — Ambulatory Visit (HOSPITAL_COMMUNITY)
Admission: RE | Admit: 2022-04-09 | Discharge: 2022-04-09 | Disposition: A | Discharge status: Home / Self Care | Payer: Medicare Other | Attending: Urology | Admitting: Urology

## 2022-04-09 ENCOUNTER — Ambulatory Visit (HOSPITAL_COMMUNITY): Payer: Medicare Other | Admitting: Emergency Medicine

## 2022-04-09 ENCOUNTER — Encounter (HOSPITAL_COMMUNITY)
Admission: RE | Disposition: A | Discharge status: Home / Self Care | Payer: Self-pay | Source: Home / Self Care | Attending: Urology

## 2022-04-09 ENCOUNTER — Ambulatory Visit (HOSPITAL_COMMUNITY): Payer: Medicare Other

## 2022-04-09 ENCOUNTER — Encounter (HOSPITAL_COMMUNITY): Payer: Self-pay | Admitting: Urology

## 2022-04-09 ENCOUNTER — Ambulatory Visit (HOSPITAL_BASED_OUTPATIENT_CLINIC_OR_DEPARTMENT_OTHER): Payer: Medicare Other | Admitting: Emergency Medicine

## 2022-04-09 DIAGNOSIS — J45909 Unspecified asthma, uncomplicated: Secondary | ICD-10-CM | POA: Diagnosis not present

## 2022-04-09 DIAGNOSIS — E039 Hypothyroidism, unspecified: Secondary | ICD-10-CM | POA: Diagnosis not present

## 2022-04-09 DIAGNOSIS — K219 Gastro-esophageal reflux disease without esophagitis: Secondary | ICD-10-CM | POA: Insufficient documentation

## 2022-04-09 DIAGNOSIS — N200 Calculus of kidney: Secondary | ICD-10-CM

## 2022-04-09 DIAGNOSIS — N202 Calculus of kidney with calculus of ureter: Secondary | ICD-10-CM | POA: Insufficient documentation

## 2022-04-09 DIAGNOSIS — I10 Essential (primary) hypertension: Secondary | ICD-10-CM | POA: Diagnosis not present

## 2022-04-09 HISTORY — DX: Anemia, unspecified: D64.9

## 2022-04-09 HISTORY — DX: Personal history of urinary calculi: Z87.442

## 2022-04-09 HISTORY — DX: Unspecified osteoarthritis, unspecified site: M19.90

## 2022-04-09 HISTORY — PX: CYSTOSCOPY/URETEROSCOPY/HOLMIUM LASER/STENT PLACEMENT: SHX6546

## 2022-04-09 HISTORY — DX: Pneumonia, unspecified organism: J18.9

## 2022-04-09 HISTORY — DX: Unspecified cataract: H26.9

## 2022-04-09 SURGERY — CYSTOSCOPY/URETEROSCOPY/HOLMIUM LASER/STENT PLACEMENT
Anesthesia: General | Laterality: Left

## 2022-04-09 MED ORDER — FENTANYL CITRATE (PF) 250 MCG/5ML IJ SOLN
INTRAMUSCULAR | Status: DC | PRN
  Administered 2022-04-09: 50 ug via INTRAVENOUS

## 2022-04-09 MED ORDER — ONDANSETRON HCL 4 MG/2ML IJ SOLN: 4.0000 mg | Freq: Once | INTRAMUSCULAR | Status: DC | PRN

## 2022-04-09 MED ORDER — 0.9 % SODIUM CHLORIDE (POUR BTL) OPTIME
TOPICAL | Status: DC | PRN
  Administered 2022-04-09: 1000 mL

## 2022-04-09 MED ORDER — FENTANYL CITRATE PF 50 MCG/ML IJ SOSY: 25.0000 ug | PREFILLED_SYRINGE | INTRAMUSCULAR | Status: DC | PRN

## 2022-04-09 MED ORDER — OXYCODONE HCL 5 MG/5ML PO SOLN: 5.0000 mg | Freq: Once | ORAL | Status: DC | PRN

## 2022-04-09 MED ORDER — ONDANSETRON HCL 4 MG/2ML IJ SOLN
INTRAMUSCULAR | Status: DC | PRN
  Administered 2022-04-09: 4 mg via INTRAVENOUS

## 2022-04-09 MED ORDER — DEXAMETHASONE SODIUM PHOSPHATE 10 MG/ML IJ SOLN
INTRAMUSCULAR | Status: AC
  Filled 2022-04-09: qty 1

## 2022-04-09 MED ORDER — ACETAMINOPHEN 10 MG/ML IV SOLN: 1000.0000 mg | Freq: Once | INTRAVENOUS | Status: DC | PRN

## 2022-04-09 MED ORDER — CHLORHEXIDINE GLUCONATE 0.12 % MT SOLN
15.0000 mL | Freq: Once | OROMUCOSAL | Status: AC
  Administered 2022-04-09: 15 mL via OROMUCOSAL

## 2022-04-09 MED ORDER — SODIUM CHLORIDE 0.9 % IR SOLN
Status: DC | PRN
  Administered 2022-04-09: 3000 mL via INTRAVESICAL

## 2022-04-09 MED ORDER — ONDANSETRON HCL 4 MG/2ML IJ SOLN
INTRAMUSCULAR | Status: AC
  Filled 2022-04-09: qty 2

## 2022-04-09 MED ORDER — OXYCODONE HCL 5 MG PO TABS: 5.0000 mg | ORAL_TABLET | Freq: Once | ORAL | Status: DC | PRN

## 2022-04-09 MED ORDER — PHENYLEPHRINE 80 MCG/ML (10ML) SYRINGE FOR IV PUSH (FOR BLOOD PRESSURE SUPPORT)
PREFILLED_SYRINGE | INTRAVENOUS | Status: DC | PRN
  Administered 2022-04-09 (×2): 160 ug via INTRAVENOUS

## 2022-04-09 MED ORDER — ORAL CARE MOUTH RINSE: 15.0000 mL | Freq: Once | OROMUCOSAL | Status: AC

## 2022-04-09 MED ORDER — LIDOCAINE HCL (PF) 2 % IJ SOLN
INTRAMUSCULAR | Status: AC
  Filled 2022-04-09: qty 5

## 2022-04-09 MED ORDER — FENTANYL CITRATE (PF) 100 MCG/2ML IJ SOLN
INTRAMUSCULAR | Status: AC
  Filled 2022-04-09: qty 2

## 2022-04-09 MED ORDER — PROPOFOL 10 MG/ML IV BOLUS
INTRAVENOUS | Status: AC
  Filled 2022-04-09: qty 20

## 2022-04-09 MED ORDER — CIPROFLOXACIN IN D5W 400 MG/200ML IV SOLN
400.0000 mg | INTRAVENOUS | Status: AC
  Administered 2022-04-09: 400 mg via INTRAVENOUS
  Filled 2022-04-09: qty 200

## 2022-04-09 MED ORDER — LACTATED RINGERS IV SOLN: INTRAVENOUS | Status: DC

## 2022-04-09 MED ORDER — PROPOFOL 10 MG/ML IV BOLUS
INTRAVENOUS | Status: DC | PRN
  Administered 2022-04-09: 150 mg via INTRAVENOUS

## 2022-04-09 MED ORDER — LIDOCAINE 2% (20 MG/ML) 5 ML SYRINGE
INTRAMUSCULAR | Status: DC | PRN
  Administered 2022-04-09: 100 mg via INTRAVENOUS

## 2022-04-09 MED ORDER — MELOXICAM 15 MG PO TABS: 15.0000 mg | ORAL_TABLET | Freq: Every day | ORAL | 0 refills | Status: AC

## 2022-04-09 MED ORDER — IOHEXOL 300 MG/ML  SOLN
INTRAMUSCULAR | Status: DC | PRN
  Administered 2022-04-09: 10 mL

## 2022-04-09 MED ORDER — DEXAMETHASONE SODIUM PHOSPHATE 10 MG/ML IJ SOLN
INTRAMUSCULAR | Status: DC | PRN
  Administered 2022-04-09: 10 mg via INTRAVENOUS

## 2022-04-09 SURGICAL SUPPLY — 26 items
BAG COUNTER SPONGE SURGICOUNT (BAG) IMPLANT
BAG SPNG CNTER NS LX DISP (BAG)
BAG URO CATCHER STRL LF (MISCELLANEOUS) ×1 IMPLANT
BASKET ZERO TIP NITINOL 2.4FR (BASKET) IMPLANT
BSKT STON RTRVL ZERO TP 2.4FR (BASKET)
CATH URETERAL DUAL LUMEN 10F (MISCELLANEOUS) ×1 IMPLANT
CATH URETL OPEN END 6FR 70 (CATHETERS) IMPLANT
CLOTH BEACON ORANGE TIMEOUT ST (SAFETY) ×1 IMPLANT
GLOVE SS BIOGEL STRL SZ 7 (GLOVE) ×1 IMPLANT
GLOVE SURG LX STRL 7.5 STRW (GLOVE) ×1 IMPLANT
GOWN STRL REUS W/ TWL XL LVL3 (GOWN DISPOSABLE) ×1 IMPLANT
GOWN STRL REUS W/TWL XL LVL3 (GOWN DISPOSABLE) ×1
GUIDEWIRE STR DUAL SENSOR (WIRE) ×1 IMPLANT
GUIDEWIRE ZIPWRE .038 STRAIGHT (WIRE) ×1 IMPLANT
IV NS 1000ML (IV SOLUTION) ×1
IV NS 1000ML BAXH (IV SOLUTION) ×1 IMPLANT
KIT TURNOVER KIT A (KITS) IMPLANT
LASER FIB FLEXIVA PULSE ID 365 (Laser) IMPLANT
MANIFOLD NEPTUNE II (INSTRUMENTS) ×1 IMPLANT
PACK CYSTO (CUSTOM PROCEDURE TRAY) ×1 IMPLANT
SHEATH NAVIGATOR HD 11/13X36 (SHEATH) IMPLANT
STENT URET 6FRX22 CONTOUR (STENTS) IMPLANT
TRACTIP FLEXIVA PULS ID 200XHI (Laser) IMPLANT
TRACTIP FLEXIVA PULSE ID 200 (Laser) IMPLANT
TUBING CONNECTING 10 (TUBING) ×1 IMPLANT
TUBING UROLOGY SET (TUBING) ×1 IMPLANT

## 2022-04-09 NOTE — Op Note (Signed)
Operative Note  Preoperative diagnosis:  1.  Left renal stone  Postoperative diagnosis: 1.  Left  renal stone  Procedure(s): 1.  Left ureteroscopy with laser lithotripsy of stone 2. Cystoscopy  3. Left retrograde pyelogram 4. Left ureteral stent placement 5. Fluoroscopy with intraoperative interpretation  Surgeon: Donald Pore, MD  Assistants:  None  Anesthesia:  General  Complications:  None  EBL:  Minimal  Specimens: 1. None (stone dusted)  Drains/Catheters: 1.  Left 6Fr x 22cm ureteral stent (no string)  Intraoperative findings:   Cystoscopy demonstrated unremarkable bladder Left Ureteroscopy demonstrated stone had been pushed into lower pole Successful stent placement.  Description of procedure: After informed consent was obtained from the patient, the patient was identified and taken to the operating room and placed in the supine position.  General anesthesia was administered as well as perioperative IV antibiotics.  At the beginning of the case, a time-out was performed to properly identify the patient, the surgery to be performed, and the surgical site.  Sequential compression devices were applied to the lower extremities at the beginning of the case for DVT prophylaxis.  The patient was then placed in the dorsal lithotomy supine position, prepped and draped in sterile fashion.  Preliminary scout fluoroscopy revealed that there was a calcification area at the left collecting system, which corresponds to the  stone found on the preoperative CT scan. We then passed the 21-French rigid cystoscope through the urethra and into the bladder under vision without any difficulty, noting a normal urethra without strictures.  A systematic evaluation of the bladder revealed no evidence of any suspicious bladder lesions.  Ureteral orifices were in normal position.    We then passed a 0.038 glide wire up to the level of the renal pelvis.  The ureteral stent was then removed, leaving  the glide wire up the left ureter.  The cystoscope was withdrawn, and a dual lumen catheter was inserted over the glide wire into the distal ureter. A gentle retrograde pyelogram was performed, revealing a normal caliber ureter without any filling defects. There was no hydronephrosis of the collecting system. There was a filling defect in the collecting system corresponding to the stone. A 0.038 sensor wire was then passed up to the level of the renal pelvis and secured to the drape as a safety wire. The dual lumen was removed.  I attempted to pass the inner implement of the access sheath up the ureter to dilate. I was able to make it just proximal to the pelvic inlet before encountering resistance. I elected to not attempt to deploy the full access sheath. Instead, I was able to slide the ureteroscope over the zip wire into the collecting system.   The collecting system was inspected. The calculus was identified at the lower pole. Using the 242 micron holmium laser fiber, the stone was fragmented completely.  With the ureteroscope in the kidney, a gentle pyelogram was performed to delineate the calyceal system and we evaluated the calyces systematically. We encountered no further stone fragments greater than 2 mm. The rest of the stone fragments were very tiny and these were  irrigated away gently. The calyces were re-inspected and there were no significant stone fragment residual. After retrograde, there was some contrast outside of the kidney. There was no evidence of ureteral damage - this could have been either from pyelovenous pressure or a ruptured fornix.   We then withdrew the ureteroscope back down the ureter, noting no evidence of any stones along the  course of the ureter, or ureteral damage.  Prior to removing the ureteroscope, we did pass the Glidewire back up to the ureter to the renal pelvis.  Once the ureteroscope was removed, we then used the Glidewire under fluoroscopic guidance and passed up  a 6-French x 22 cm double-pigtail ureteral stent up the ureter, making sure that the proximal and distal ends coiled within the kidney and bladder respectively.  The patient tolerated the procedure well and there was no complication. Patient was awoken from anesthesia and taken to the recovery room in stable condition. I was present and scrubbed for the entirety of the case.  Plan:  Patient will be discharged home.  Follow up with me in 7 to 10 days for stent removal in the office.   Daryll Brod MD [facility]  Pager: [phone]

## 2022-04-09 NOTE — Anesthesia Procedure Notes (Addendum)
Procedure Name: LMA Insertion Date/Time: 04/09/2022 11:18 AM  Performed by: Jenne Campus, CRNAPre-anesthesia Checklist: Patient identified, Emergency Drugs available, Suction available and Patient being monitored Patient Re-evaluated:Patient Re-evaluated prior to induction Oxygen Delivery Method: Circle System Utilized Preoxygenation: Pre-oxygenation with 100% oxygen Induction Type: IV induction Ventilation: Mask ventilation without difficulty LMA: LMA inserted LMA Size: 3.0 Number of attempts: 1 Placement Confirmation: positive ETCO2 and breath sounds checked- equal and bilateral Tube secured with: Tape Dental Injury: Teeth and Oropharynx as per pre-operative assessment

## 2022-04-09 NOTE — Transfer of Care (Signed)
Immediate Anesthesia Transfer of Care Note  Patient: Elizabeth Mills  Procedure(s) Performed: LEFT URETEROSCOPY/HOLMIUM LASER/STENT PLACEMENT (Left)  Patient Location: PACU  Anesthesia Type:General  Level of Consciousness: oriented, drowsy, and patient cooperative  Airway & Oxygen Therapy: Patient Spontanous Breathing and Patient connected to face mask oxygen  Post-op Assessment: Report given to RN and Post -op Vital signs reviewed and stable  Post vital signs: Reviewed  Last Vitals:  Vitals Value Taken Time  BP 127/65 04/09/22 1223  Temp    Pulse 80 04/09/22 1224  Resp 13 04/09/22 1223  SpO2 100 % 04/09/22 1224  Vitals shown include unvalidated device data.  Last Pain:  Vitals:   04/09/22 0916  TempSrc:   PainSc: 0-No pain         Complications: No notable events documented.

## 2022-04-09 NOTE — Anesthesia Postprocedure Evaluation (Signed)
Anesthesia Post Note  Patient: Elizabeth Mills  Procedure(s) Performed: LEFT URETEROSCOPY/HOLMIUM LASER/STENT PLACEMENT (Left)     Patient location during evaluation: PACU Anesthesia Type: General Level of consciousness: awake and alert Pain management: pain level controlled Vital Signs Assessment: post-procedure vital signs reviewed and stable Respiratory status: spontaneous breathing, nonlabored ventilation, respiratory function stable and patient connected to nasal cannula oxygen Cardiovascular status: blood pressure returned to baseline and stable Postop Assessment: no apparent nausea or vomiting Anesthetic complications: no  No notable events documented.  Last Vitals:  Vitals:   04/09/22 0851 04/09/22 1230  BP: 133/80 124/60  Pulse: 100 77  Resp: 16 12  Temp: 36.6 C   SpO2: 96% 92%    Last Pain:  Vitals:   04/09/22 1230  TempSrc:   PainSc: 0-No pain                 Alayah Knouff S

## 2022-04-09 NOTE — H&P (Signed)
H&P  History of Present Illness: Elizabeth Mills is a 77 y.o. year old F who presents today for left ureteroscopy for a stone in the left collecting system.   No acute complaints today  Past Medical History:  Diagnosis Date   Allergic rhinitis, cause unspecified    Anemia    Arthritis    Asthma    Back pain    Colitis    Diverticulitis    Generalized headaches    GERD (gastroesophageal reflux disease)    History of kidney stones    Hypercholesterolemia    Hypothyroidism    LVH (left ventricular hypertrophy)    Mild tricuspid regurgitation    Pneumonia    PSVT (paroxysmal supraventricular tachycardia)    Statin intolerance     Past Surgical History:  Procedure Laterality Date   ABDOMINAL HYSTERECTOMY  1983   APPENDECTOMY  1955   BACK SURGERY     BREAST BIOPSY  1982   BREAST EXCISIONAL BIOPSY Bilateral    CATARACT EXTRACTION W/ INTRAOCULAR LENS IMPLANT Bilateral    COLONOSCOPY     EYE SURGERY  11/14/2009, 1990   blocked tear duct repair   INNER EAR SURGERY     TONSILLECTOMY  1953    Home Medications:  Current Meds  Medication Sig   ADVAIR DISKUS 250-50 MCG/ACT AEPB USE 1 INHALATION BY MOUTH  EVERY 12 HOURS   albuterol (PROAIR HFA) 108 (90 Base) MCG/ACT inhaler Inhale 1-2 puffs into the lungs every 4 (four) hours as needed for wheezing or shortness of breath. (Patient taking differently: Inhale 1-2 puffs into the lungs 2 (two) times daily.)   Biotin w/ Vitamins C & E (HAIR SKIN & NAILS GUMMIES PO) Take 2 Pieces of gum by mouth daily.   cefadroxil (DURICEF) 500 MG capsule Take 1 capsule (500 mg total) by mouth 2 (two) times daily.   estradiol (ESTRACE) 0.1 MG/GM vaginal cream PLACE 1 APPLICATORFUL  VAGINALLY WEEKLY   fluticasone (FLONASE) 50 MCG/ACT nasal spray PLACE 2 SPRAYS INTO BOTH NOSTRILS DAILY AS NEEDED FOR ALLERGIES OR RHINITIS (ALLERGIES).   loratadine (CLARITIN) 10 MG tablet Take 10 mg by mouth daily.   metoprolol succinate (TOPROL-XL) 25 MG 24 hr  tablet TAKE ONE-HALF TABLET BY  MOUTH DAILY , MAY ALSO TAKE AN EXTRA ONE-HALF TABLET BY MOUTH DAILY AS NEEDED FOR  PALPITATIONS   rosuvastatin (CRESTOR) 40 MG tablet Take 1 tablet (40 mg total) by mouth 3 (three) times a week.   SYNTHROID 75 MCG tablet TAKE 1 TABLET BY MOUTH  DAILY BEFORE BREAKFAST   tamsulosin (FLOMAX) 0.4 MG CAPS capsule Take 1 capsule (0.4 mg total) by mouth daily.   Vitamin D, Cholecalciferol, 25 MCG (1000 UT) TABS Take 3,000 Units by mouth daily.  gummies    Allergies:  Allergies  Allergen Reactions   Crestor [Rosuvastatin Calcium] Other (See Comments)    Gi issues Taking three time a week   Lipitor [Atorvastatin Calcium]     Gi symp   Phenergan [Promethazine Hcl] Other (See Comments)    Father and son are highly allergic so patient doesn't take it   Pravachol Other (See Comments)    Gi issues   Promethazine Other (See Comments)    Other reaction(s): Other (See Comments) Father and son have AMS with Phenergan - patient afraid to take Father and son have AMS with Phenergan - patient afraid to take   Promethazine Hcl    Sulfa Antibiotics Other (See Comments)    dizziness  Sulfamethoxazole Other (See Comments)    Other reaction(s): Dizziness (intolerance) Disoriented Disoriented   Zetia [Ezetimibe] Other (See Comments)    Gi issues   Zocor [Simvastatin - High Dose] Other (See Comments)    Gi issues   Pseudoephedrine Palpitations    Family History  Problem Relation Age of Onset   Cancer Mother        Kidney and Ovarian Cancer   Arthritis Mother    Hyperlipidemia Mother    Arthritis Father    Hyperlipidemia Father    Breast cancer Neg Hx     Social History:  reports that she has never smoked. She has never used smokeless tobacco. She reports that she does not drink alcohol and does not use drugs.  ROS: A complete review of systems was performed.  All systems are negative except for pertinent findings as noted.  Physical Exam:  Vital signs in  last 24 hours: Temp:  [97.9 F (36.6 C)] 97.9 F (36.6 C) (01/17 0851) Pulse Rate:  [100] 100 (01/17 0851) Resp:  [16] 16 (01/17 0851) BP: (133)/(80) 133/80 (01/17 0851) SpO2:  [96 %] 96 % (01/17 0851) Weight:  [62.1 kg] 62.1 kg (01/16 1226) Constitutional:  Alert and oriented, No acute distress Cardiovascular: Regular rate and rhythm Respiratory: Normal respiratory effort, Lungs clear bilaterally GI: Abdomen is soft, nontender, nondistended, no abdominal masses Lymphatic: No lymphadenopathy Neurologic: Grossly intact, no focal deficits Psychiatric: Normal mood and affect   Laboratory Data:  No results for input(s): "WBC", "HGB", "HCT", "PLT" in the last 72 hours.  No results for input(s): "NA", "K", "CL", "GLUCOSE", "BUN", "CALCIUM", "CREATININE" in the last 72 hours.  Invalid input(s): "CO3"   No results found for this or any previous visit (from the past 24 hour(s)). Recent Results (from the past 240 hour(s))  Urine Culture     Status: Abnormal   Collection Time: 04/05/22  2:19 PM   Specimen: Urine, Clean Catch  Result Value Ref Range Status   Specimen Description   Final    URINE, CLEAN CATCH Performed at [facility], [place] [address], [place], [place] [address]    Special Requests   Final    NONE Performed at [facility], [place] [address] [place], [place] [address]    Culture (A)  Final    <10,000 COLONIES/mL INSIGNIFICANT GROWTH Performed at [facility] [address] [place], [place] [address]    Report Status 04/06/2022 FINAL  Final    Renal Function: Recent Labs    04/05/22 1449  CREATININE 0.80   Estimated Creatinine Clearance: 52.5 mL/min (by C-G formula based on SCr of 0.8 mg/dL).  Radiologic Imaging: No results found.  Assessment:  Elizabeth Mills is a 77 y.o. year old F with a left stone  Plan:  To OR for ureteroscopy with laser lithotripsy. Procedure and risks reviewed, including but not  limited to UTI, sepsis, hematuria, damage to GU tract, need for staged procedure, stent discomfort, need for future procedures.   Donald Pore, MD 04/09/2022, 8:59 AM  [facility] Pager: [phone]

## 2022-04-09 NOTE — Discharge Instructions (Addendum)

## 2022-04-10 ENCOUNTER — Encounter (HOSPITAL_COMMUNITY): Payer: Self-pay | Admitting: Urology

## 2022-04-11 ENCOUNTER — Other Ambulatory Visit: Payer: Self-pay | Admitting: Interventional Cardiology

## 2022-06-17 ENCOUNTER — Other Ambulatory Visit: Payer: Self-pay | Admitting: Internal Medicine

## 2022-06-17 ENCOUNTER — Other Ambulatory Visit: Payer: Self-pay

## 2022-07-28 ENCOUNTER — Other Ambulatory Visit: Payer: Self-pay | Admitting: Internal Medicine

## 2022-07-28 DIAGNOSIS — Z1231 Encounter for screening mammogram for malignant neoplasm of breast: Secondary | ICD-10-CM

## 2022-09-02 ENCOUNTER — Ambulatory Visit
Admission: RE | Admit: 2022-09-02 | Discharge: 2022-09-02 | Disposition: A | Discharge status: Home / Self Care | Payer: Medicare Other | Source: Ambulatory Visit | Attending: Internal Medicine | Admitting: Internal Medicine

## 2022-09-02 DIAGNOSIS — Z1231 Encounter for screening mammogram for malignant neoplasm of breast: Secondary | ICD-10-CM

## 2022-11-18 ENCOUNTER — Telehealth: Payer: Self-pay | Admitting: Internal Medicine

## 2022-11-18 NOTE — Telephone Encounter (Signed)
Pt called stating she received a message in reference about a Bone Density test please advise.

## 2022-12-11 ENCOUNTER — Other Ambulatory Visit: Payer: Medicare Other

## 2022-12-13 ENCOUNTER — Other Ambulatory Visit: Payer: Self-pay | Admitting: Internal Medicine

## 2022-12-13 DIAGNOSIS — M8589 Other specified disorders of bone density and structure, multiple sites: Secondary | ICD-10-CM

## 2022-12-16 ENCOUNTER — Ambulatory Visit
Admission: RE | Admit: 2022-12-16 | Discharge: 2022-12-16 | Disposition: A | Discharge status: Home / Self Care | Payer: Medicare Other | Source: Ambulatory Visit | Attending: Internal Medicine

## 2022-12-16 DIAGNOSIS — M8589 Other specified disorders of bone density and structure, multiple sites: Secondary | ICD-10-CM | POA: Diagnosis not present

## 2022-12-17 ENCOUNTER — Other Ambulatory Visit: Payer: Self-pay | Admitting: Internal Medicine

## 2022-12-31 ENCOUNTER — Encounter: Payer: Self-pay | Admitting: Internal Medicine

## 2022-12-31 NOTE — Progress Notes (Unsigned)
Subjective:    Patient ID: Elizabeth Mills, female    DOB: 06-12-1945, 77 y.o.   MRN: [id number]      HPI Elizabeth Mills is here for a Physical exam and her chronic medical problems.   Has a little knot on her left hand.   Stiffness in morning and after being stationary   Is this persistent swelling in the medial aspect of her left elbow that has been present for years-it started after she had an IV there.  Has not changed in size.  It is sore at times.  Medications and allergies reviewed with patient and updated if appropriate.  Current Outpatient Medications on File Prior to Visit  Medication Sig Dispense Refill   ADVAIR DISKUS 250-50 MCG/ACT AEPB USE 1 INHALATION BY MOUTH  EVERY 12 HOURS 180 each 3   albuterol (VENTOLIN HFA) 108 (90 Base) MCG/ACT inhaler USE 1 TO 2 INHALATIONS BY MOUTH  EVERY 4 HOURS AS NEEDED FOR  WHEEZING OR SHORTNESS OF BREATH 51 g 3   Biotin w/ Vitamins C & E (HAIR SKIN & NAILS GUMMIES PO) Take 2 Pieces of gum by mouth daily.     Cyanocobalamin (VITAMIN B12 PO) Take by mouth. Patient takes gummies     estradiol (ESTRACE) 0.1 MG/GM vaginal cream PLACE 1 APPLICATORFUL  VAGINALLY WEEKLY 85 g 3   fluticasone (FLONASE) 50 MCG/ACT nasal spray PLACE 2 SPRAYS INTO BOTH NOSTRILS DAILY AS NEEDED FOR ALLERGIES OR RHINITIS (ALLERGIES). 16 mL 1   loratadine (CLARITIN) 10 MG tablet Take 10 mg by mouth daily.     metoprolol succinate (TOPROL-XL) 25 MG 24 hr tablet TAKE ONE-HALF TABLET BY  MOUTH DAILY , MAY ALSO TAKE AN EXTRA ONE-HALF TABLET BY MOUTH DAILY AS NEEDED FOR  PALPITATIONS 90 tablet 2   ondansetron (ZOFRAN) 4 MG tablet Take 1 tablet (4 mg total) by mouth every 6 (six) hours. 15 tablet 0   oxyCODONE-acetaminophen (PERCOCET/ROXICET) 5-325 MG tablet Take 1 tablet by mouth every 6 (six) hours as needed for severe pain. 15 tablet 0   rosuvastatin (CRESTOR) 40 MG tablet TAKE 1 TABLET BY MOUTH 3 TIMES  WEEKLY 45 tablet 3   SYNTHROID 75 MCG tablet TAKE 1 TABLET BY  MOUTH DAILY  BEFORE BREAKFAST 90 tablet 3   tamsulosin (FLOMAX) 0.4 MG CAPS capsule Take 1 capsule (0.4 mg total) by mouth daily. 30 capsule 0   Vitamin D, Cholecalciferol, 25 MCG (1000 UT) TABS Take 3,000 Units by mouth daily.  gummies     No current facility-administered medications on file prior to visit.    Review of Systems  Constitutional:  Negative for fever.  HENT:         Mucus in throat  Eyes:  Negative for visual disturbance.  Respiratory:  Negative for cough, shortness of breath and wheezing.   Cardiovascular:  Negative for chest pain, palpitations and leg swelling.  Gastrointestinal:  Negative for abdominal pain, blood in stool, constipation and diarrhea (occ very soft stools with certain foods).       Gerd  Genitourinary:  Negative for dysuria.  Musculoskeletal:  Positive for arthralgias (hip, hands, feet) and back pain (chronic).  Skin:  Positive for rash (back at waistline - itches - ? eczema).  Neurological:  Positive for dizziness (occ - sinus related). Negative for light-headedness and headaches.  Psychiatric/Behavioral:  Negative for dysphoric mood. The patient is not nervous/anxious.        Objective:   Vitals:   01/01/23 0931  BP: 124/72  Pulse: 79  Temp: 98 F (36.7 C)  SpO2: 93%   Filed Weights   01/01/23 0931  Weight: 135 lb (61.2 kg)   Body mass index is 24.3 kg/m.  BP Readings from Last 3 Encounters:  01/01/23 124/72  04/09/22 120/62  04/05/22 (!) 140/78    Wt Readings from Last 3 Encounters:  01/01/23 135 lb (61.2 kg)  04/09/22 136 lb 11 oz (62 kg)  04/05/22 138 lb 14.2 oz (63 kg)       Physical Exam Constitutional: She appears well-developed and well-nourished. No distress.  HENT:  Head: Normocephalic and atraumatic.  Right Ear: External ear normal. Normal ear canal and TM Left Ear: External ear normal.  Normal ear canal and TM Mouth/Throat: Oropharynx is clear and moist.  Eyes: Conjunctivae normal.  Neck: Neck supple. No  tracheal deviation present. No thyromegaly present.  No carotid bruit  Cardiovascular: Normal rate, regular rhythm and normal heart sounds.   No murmur heard.  No edema. Pulmonary/Chest: Effort normal and breath sounds normal. No respiratory distress. She has no wheezes. She has no rales.  Breast: deferred   Abdominal: Soft. She exhibits no distension. There is no tenderness.  Lymphadenopathy: She has no cervical adenopathy.  Skin: Skin is warm and dry. She is not diaphoretic.  Psychiatric: She has a normal mood and affect. Her behavior is normal.     Lab Results  Component Value Date   WBC 5.1 04/05/2022   HGB 14.1 04/05/2022   HCT 41.6 04/05/2022   PLT 270 04/05/2022   GLUCOSE 105 (H) 04/05/2022   CHOL 191 12/12/2021   TRIG 98.0 12/12/2021   HDL 78.20 12/12/2021   LDLDIRECT 94.2 07/28/2012   LDLCALC 93 12/12/2021   ALT 19 12/12/2021   AST 20 12/12/2021   NA 139 04/05/2022   K 4.1 04/05/2022   CL 101 04/05/2022   CREATININE 0.80 04/05/2022   BUN 15 04/05/2022   CO2 29 04/05/2022   TSH 2.32 12/12/2021   HGBA1C 5.5 10/28/2017         Assessment & Plan:   Physical exam: Screening blood work  ordered Exercise walking most days Weight normal Substance abuse  none   Reviewed recommended immunizations.   Health Maintenance  Topic Date Due   COVID-19 Vaccine (6 - 2023-24 season) 01/17/2023 (Originally 11/23/2022)   Medicare Annual Wellness (AWV)  02/01/2023 (Originally 12/04/2022)   Zoster Vaccines- Shingrix (1 of 2) 04/03/2023 (Originally 10/14/1995)   INFLUENZA VACCINE  06/22/2023 (Originally 10/23/2022)   DTaP/Tdap/Td (3 - Td or Tdap) 12/31/2023   DEXA SCAN  12/15/2024   Pneumonia Vaccine 59+ Years old  Completed   Hepatitis C Screening  Completed   HPV VACCINES  Aged Out   Colonoscopy  Discontinued          See Problem List for Assessment and Plan of chronic medical problems.

## 2023-01-01 ENCOUNTER — Ambulatory Visit: Payer: Medicare Other | Admitting: Internal Medicine

## 2023-01-01 ENCOUNTER — Other Ambulatory Visit: Payer: Self-pay | Admitting: Internal Medicine

## 2023-01-01 VITALS — BP 124/72 | HR 79 | Temp 98.0°F | Ht 62.5 in | Wt 135.0 lb

## 2023-01-01 DIAGNOSIS — E039 Hypothyroidism, unspecified: Secondary | ICD-10-CM

## 2023-01-01 DIAGNOSIS — M25522 Pain in left elbow: Secondary | ICD-10-CM

## 2023-01-01 DIAGNOSIS — J4520 Mild intermittent asthma, uncomplicated: Secondary | ICD-10-CM

## 2023-01-01 DIAGNOSIS — R739 Hyperglycemia, unspecified: Secondary | ICD-10-CM

## 2023-01-01 DIAGNOSIS — E7800 Pure hypercholesterolemia, unspecified: Secondary | ICD-10-CM

## 2023-01-01 DIAGNOSIS — M720 Palmar fascial fibromatosis [Dupuytren]: Secondary | ICD-10-CM

## 2023-01-01 DIAGNOSIS — I471 Supraventricular tachycardia, unspecified: Secondary | ICD-10-CM | POA: Diagnosis not present

## 2023-01-01 DIAGNOSIS — Z0000 Encounter for general adult medical examination without abnormal findings: Secondary | ICD-10-CM | POA: Diagnosis not present

## 2023-01-01 DIAGNOSIS — M8589 Other specified disorders of bone density and structure, multiple sites: Secondary | ICD-10-CM | POA: Diagnosis not present

## 2023-01-01 DIAGNOSIS — N952 Postmenopausal atrophic vaginitis: Secondary | ICD-10-CM

## 2023-01-01 LAB — CBC WITH DIFFERENTIAL/PLATELET
Basophils Absolute: 0.1 10*3/uL (ref 0.0–0.1)
Basophils Relative: 1.2 % (ref 0.0–3.0)
Eosinophils Absolute: 0.4 10*3/uL (ref 0.0–0.7)
Eosinophils Relative: 7.7 % — ABNORMAL HIGH (ref 0.0–5.0)
HCT: 44.8 % (ref 36.0–46.0)
Hemoglobin: 15.2 g/dL — ABNORMAL HIGH (ref 12.0–15.0)
Lymphocytes Relative: 36.3 % (ref 12.0–46.0)
Lymphs Abs: 1.8 10*3/uL (ref 0.7–4.0)
MCHC: 34 g/dL (ref 30.0–36.0)
MCV: 91.3 fL (ref 78.0–100.0)
Monocytes Absolute: 0.6 10*3/uL (ref 0.1–1.0)
Monocytes Relative: 12.8 % — ABNORMAL HIGH (ref 3.0–12.0)
Neutro Abs: 2.1 10*3/uL (ref 1.4–7.7)
Neutrophils Relative %: 42 % — ABNORMAL LOW (ref 43.0–77.0)
Platelets: 289 10*3/uL (ref 150.0–400.0)
RBC: 4.9 Mil/uL (ref 3.87–5.11)
RDW: 13.6 % (ref 11.5–15.5)
WBC: 5 10*3/uL (ref 4.0–10.5)

## 2023-01-01 LAB — COMPREHENSIVE METABOLIC PANEL
ALT: 18 U/L (ref 0–35)
AST: 20 U/L (ref 0–37)
Albumin: 4.2 g/dL (ref 3.5–5.2)
Alkaline Phosphatase: 45 U/L (ref 39–117)
BUN: 13 mg/dL (ref 6–23)
CO2: 31 meq/L (ref 19–32)
Calcium: 9.7 mg/dL (ref 8.4–10.5)
Chloride: 102 meq/L (ref 96–112)
Creatinine, Ser: 0.68 mg/dL (ref 0.40–1.20)
GFR: 84.13 mL/min (ref >60.00)
Glucose, Bld: 99 mg/dL (ref 70–99)
Potassium: 4.1 meq/L (ref 3.5–5.1)
Sodium: 139 meq/L (ref 135–145)
Total Bilirubin: 0.8 mg/dL (ref 0.2–1.2)
Total Protein: 6.8 g/dL (ref 6.0–8.3)

## 2023-01-01 LAB — LIPID PANEL
Cholesterol: 241 mg/dL — ABNORMAL HIGH (ref 0–200)
HDL: 84.3 mg/dL (ref >39.00)
LDL Cholesterol: 136 mg/dL — ABNORMAL HIGH (ref 0–99)
NonHDL: 156.66
Total CHOL/HDL Ratio: 3
Triglycerides: 103 mg/dL (ref 0.0–149.0)
VLDL: 20.6 mg/dL (ref 0.0–40.0)

## 2023-01-01 LAB — VITAMIN D 25 HYDROXY (VIT D DEFICIENCY, FRACTURES): VITD: 42.44 ng/mL (ref 30.00–100.00)

## 2023-01-01 LAB — HEMOGLOBIN A1C: Hgb A1c MFr Bld: 5.5 % (ref 4.6–6.5)

## 2023-01-01 LAB — TSH: TSH: 0.27 u[IU]/mL — ABNORMAL LOW (ref 0.35–5.50)

## 2023-01-01 MED ORDER — SYNTHROID 75 MCG PO TABS: ORAL_TABLET | ORAL | Status: DC

## 2023-01-01 NOTE — Assessment & Plan Note (Signed)
New Left hand-third finger tendon No pain, swelling, redness Normal range of motion without trigger finger Advise she can monitor for now and let me know if it worsens and I can refer her to a hand orthopedic

## 2023-01-01 NOTE — Assessment & Plan Note (Signed)
Chronic Regular exercise and healthy diet encouraged Check lipid panel, CMP, CBC Continue Crestor 40 mg 3 times a week

## 2023-01-01 NOTE — Assessment & Plan Note (Signed)
Chronic A1c has been in the normal range Check a1c Low sugar / carb diet Stressed regular exercise

## 2023-01-01 NOTE — Assessment & Plan Note (Signed)
Chronic  Mild, persistent Controlled Continue Advair 250-50 inhaler twice daily Continue ProAir inhaler as needed

## 2023-01-01 NOTE — Assessment & Plan Note (Signed)
Chronic Continue Estrace vaginal cream

## 2023-01-01 NOTE — Assessment & Plan Note (Signed)
Chronic left medial elbow pain/swelling States it started years ago after she had an IV placed Has soreness at times when she lifts something heavy Has a prominence there or swelling that is unchanged Discussed ordering an ultrasound or having her see sports medicine-she deferred for now and will continue to monitor

## 2023-01-01 NOTE — Patient Instructions (Addendum)

## 2023-01-01 NOTE — Assessment & Plan Note (Signed)
Chronic  Clinically euthyroid Check tsh and will titrate med dose if needed Currently taking Synthroid 75 mcg daily

## 2023-01-01 NOTE — Assessment & Plan Note (Addendum)
Chronic DEXA up-to-date Walks regularly for exercise, advised weights Taking vitamin D daily Taking tums most days-advised high calcium diet (does get kidney stones and concerned about taking too much calcium) Check vitamin d level

## 2023-01-01 NOTE — Assessment & Plan Note (Signed)
Chronic Controlled Continue metoprolol XL 12.5 mg daily

## 2023-01-06 ENCOUNTER — Other Ambulatory Visit: Payer: Self-pay | Admitting: Internal Medicine

## 2023-01-27 NOTE — Progress Notes (Unsigned)
Cardiology Office Note    Date:  01/29/2023  ID:  Elizabeth, Mills 06/30/1945, MRN [id number] PCP:  Pincus Sanes, MD  Cardiologist: Dr. Patty Sermons -> Dr. Delton See -> Lesleigh Noe, MD (Inactive), primarily me (APP) Electrophysiologist:  None   Chief Complaint: f/u PSVT  History of Present Illness: .    Elizabeth Mills is a 77 y.o. female with visit-pertinent history of palpitations/PSVT per Dr. Yevonne Pax remote note, HLD (intolerant of higher intensity statins), aortic atherosclerosis seen on imaging 03/2022, hypothyroidism, asthma, colitis, GERD, prior LVH (not seen on last study), mild TR who presents for routine cardiology follow-up.    Remote echo 2008 showed mild-mod LVH, EF normal, + impared relaxation, mild aortic valve sclerosis without stenosis. I met her in 2019 for evaluation of shortness of breath without obvious cardiac etiology. 2D echo 12/10/17 showed EF 60-65%, normal diastolic parameters, mild TR, PASP upper limits of normal. Symptoms resolved spontaneously so further testing was deferred. She has done well over the years.  She returns for follow-up overall without interim change from prior. No sustained palpitations, anginal chest pain, syncope, or dizziness. She has occasional sensation of vertigo/imbalance under certain physical situations while walking on bleachers. No abrupt component to this. Renal stone protocol CT 03/2022 incidentally detected bronchiectasis and aortic atherosclerosis. She has h/o chronic DOE for which she uses inhalers - URIs tend to flare this up. PCP recently increased rosuvastatin to 4x/week and adjusted thyroid medicine for suppressed TSH.   Labwork independently reviewed: 12/2022 CMET ok, Hgb 15.2, plt 289, A1c 5.5, LDL 136, trig 103, TSH 0.27 PCP adjusted meds   ROS: .    Please see the history of present illness.  All other systems are reviewed and otherwise negative.  Studies Reviewed: Marland Kitchen    EKG:  EKG is ordered today,  personally reviewed, demonstrating NSR 76bpm, lower voltage QRS, no acute change from prior.  CV Studies: Cardiac studies reviewed are outlined and summarized above. Otherwise please see EMR for full report.   Current Reported Medications:.    Current Meds  Medication Sig   albuterol (VENTOLIN HFA) 108 (90 Base) MCG/ACT inhaler USE 1 TO 2 INHALATIONS BY MOUTH  EVERY 4 HOURS AS NEEDED FOR  WHEEZING OR SHORTNESS OF BREATH   Biotin w/ Vitamins C & E (HAIR SKIN & NAILS GUMMIES PO) Take 2 Pieces of gum by mouth daily.   Cyanocobalamin (VITAMIN B12 PO) Take by mouth. Patient takes gummies   estradiol (ESTRACE) 0.1 MG/GM vaginal cream PLACE 1 APPLICATORFUL  VAGINALLY WEEKLY   famotidine (PEPCID) 20 MG tablet Take 20 mg by mouth daily.   fluticasone (FLONASE) 50 MCG/ACT nasal spray PLACE 2 SPRAYS INTO BOTH NOSTRILS DAILY AS NEEDED FOR ALLERGIES OR RHINITIS (ALLERGIES).   fluticasone-salmeterol (ADVAIR) 250-50 MCG/ACT AEPB USE 1 INHALATION BY MOUTH EVERY  12 HOURS   loratadine (CLARITIN) 10 MG tablet Take 10 mg by mouth daily.   metoprolol succinate (TOPROL-XL) 25 MG 24 hr tablet TAKE ONE-HALF TABLET BY  MOUTH DAILY , MAY ALSO TAKE AN EXTRA ONE-HALF TABLET BY MOUTH DAILY AS NEEDED FOR  PALPITATIONS   Multiple Vitamin (MULTIVITAMIN) tablet Take 1 tablet by mouth daily.   ondansetron (ZOFRAN) 4 MG tablet Take 1 tablet (4 mg total) by mouth every 6 (six) hours.   oxyCODONE-acetaminophen (PERCOCET/ROXICET) 5-325 MG tablet Take 1 tablet by mouth every 6 (six) hours as needed for severe pain.   rosuvastatin (CRESTOR) 40 MG tablet TAKE 1 TABLET BY MOUTH 3  TIMES  WEEKLY   SYNTHROID 75 MCG tablet TAKE 1 TABLET BY MOUTH  DAILY  6 DAYS A WEEK, TAKE 0.5 TABLET PO 1 DAY A WEEK BEFORE BREAKFAST   tamsulosin (FLOMAX) 0.4 MG CAPS capsule Take 1 capsule (0.4 mg total) by mouth daily.   Vitamin D, Cholecalciferol, 25 MCG (1000 UT) TABS Take 3,000 Units by mouth daily.  gummies    Physical Exam:    VS:  BP 114/64    Pulse 76   Ht 5' 2.5" (1.588 m)   Wt 137 lb 9.6 oz (62.4 kg)   SpO2 94%   BMI 24.77 kg/m    Wt Readings from Last 3 Encounters:  01/29/23 137 lb 9.6 oz (62.4 kg)  01/01/23 135 lb (61.2 kg)  04/09/22 136 lb 11 oz (62 kg)    GEN: Well nourished, well developed in no acute distress NECK: No JVD; No carotid bruits CARDIAC: RRR, no murmurs, rubs, gallops RESPIRATORY:  Clear to auscultation without rales, wheezing or rhonchi  ABDOMEN: Soft, non-tender, non-distended EXTREMITIES:  No edema; No acute deformity   Asessement and Plan:.    1. PSVT, also mild TR by echo 2019 - no sustained palpitations on metoprolol 12.5mg  daily. PCP is managing thyroid dysfunction. She denies any new angina or heart failure symptoms. She does have infrequent sense of vertigo but this does not sound arrhythmic or cardiac in nature. EKG looks stable. We discussed updating echo. We discussed important warning signs that would prompt updated testing including potential event monitoring. She does not feel this is that significant of a symptom at this time requiring additional workup at this time. She will notify if symptoms change or worsen and we can reconsider.  2. HLD, aortic atherosclerosis - PCP recently adjusted rosuvastatin; patient states they planned to recheck in 6 weeks.  3. Hypothyroidism - levothyroxine recently adjusted by primary care. She says she is no longer taking a formal biotin supplement but takes a multivitamin that may have this in it. I asked her to make Dr. Lawerance Bach aware and confirm timing of recheck labs since biotin can affect thyroid assay. I will also route note as FYI. I told the patient we usually recommend to avoid biotin-containing supplements a week before thyroid lab draws.  4. Bronchiectasis by CT 03/2022 - she inquires about this finding seen on CT 03/2022. The scan suggested question of chronic atypical infection. She also carries prior h/o asthma. Her breathing tends to flare up when  she has URIs. Otherwise she is at baseline today. Will refer to pulmonary for baseline evaluation.    Disposition: F/u with Dr. Izora Ribas in 1 year to establish care, then me thereafter PRN.  Signed, Laurann Montana, PA-C

## 2023-01-29 ENCOUNTER — Encounter: Payer: Self-pay | Admitting: Physician Assistant

## 2023-01-29 ENCOUNTER — Ambulatory Visit: Payer: Medicare Other | Attending: Physician Assistant | Admitting: Physician Assistant

## 2023-01-29 VITALS — BP 114/64 | HR 76 | Ht 62.5 in | Wt 137.6 lb

## 2023-01-29 DIAGNOSIS — I071 Rheumatic tricuspid insufficiency: Secondary | ICD-10-CM

## 2023-01-29 DIAGNOSIS — E785 Hyperlipidemia, unspecified: Secondary | ICD-10-CM | POA: Diagnosis not present

## 2023-01-29 DIAGNOSIS — Z8709 Personal history of other diseases of the respiratory system: Secondary | ICD-10-CM

## 2023-01-29 DIAGNOSIS — I471 Supraventricular tachycardia, unspecified: Secondary | ICD-10-CM

## 2023-01-29 DIAGNOSIS — E039 Hypothyroidism, unspecified: Secondary | ICD-10-CM

## 2023-01-29 NOTE — Patient Instructions (Signed)
Medication Instructions:  Your physician recommends that you continue on your current medications as directed. Please refer to the Current Medication list given to you today. *If you need a refill on your cardiac medications before your next appointment, please call your pharmacy*   Lab Work: None ordered   Testing/Procedures: None ordered   Follow-Up: At [facility], you and your health needs are our priority.  As part of our continuing mission to provide you with exceptional heart care, we have created designated Provider Care Teams.  These Care Teams include your primary Cardiologist (physician) and Advanced Practice Providers (APPs -  Physician Assistants and Nurse Practitioners) who all work together to provide you with the care you need, when you need it.  We recommend signing up for the patient portal called "MyChart".  Sign up information is provided on this After Visit Summary.  MyChart is used to connect with patients for Virtual Visits (Telemedicine).  Patients are able to view lab/test results, encounter notes, upcoming appointments, etc.  Non-urgent messages can be sent to your provider as well.   To learn more about what you can do with MyChart, go to [email or web address].    Your next appointment:   12 month(s)  Provider:   Riley Lam, MD   Other Instructions You have been referred to Pulmonology.

## 2023-02-01 ENCOUNTER — Encounter: Payer: Self-pay | Admitting: Internal Medicine

## 2023-02-27 ENCOUNTER — Encounter: Payer: Self-pay | Admitting: Pulmonary Disease

## 2023-02-27 ENCOUNTER — Ambulatory Visit: Payer: Medicare Other | Admitting: Pulmonary Disease

## 2023-02-27 ENCOUNTER — Other Ambulatory Visit
Admission: RE | Admit: 2023-02-27 | Discharge: 2023-02-27 | Disposition: A | Discharge status: Home / Self Care | Payer: Medicare Other | Source: Ambulatory Visit | Attending: Pulmonary Disease | Admitting: Pulmonary Disease

## 2023-02-27 VITALS — BP 110/66 | HR 75 | Temp 98.2°F | Ht 62.5 in | Wt 139.4 lb

## 2023-02-27 DIAGNOSIS — K219 Gastro-esophageal reflux disease without esophagitis: Secondary | ICD-10-CM

## 2023-02-27 DIAGNOSIS — J479 Bronchiectasis, uncomplicated: Secondary | ICD-10-CM | POA: Diagnosis not present

## 2023-02-27 MED ORDER — PANTOPRAZOLE SODIUM 40 MG PO TBEC: 40.0000 mg | DELAYED_RELEASE_TABLET | Freq: Every day | ORAL | 1 refills | Status: DC

## 2023-02-27 NOTE — Progress Notes (Signed)
Synopsis: Referred in by Laurann Montana, PA-C   Subjective:   PATIENT ID: Elizabeth Mills GENDER: female DOB: Oct 31, 1945, MRN: [id number]  Chief Complaint  Patient presents with   Consult    Occasional wheezing and shortness of breath. No cough.     HPI Elizabeth Mills is a 77 year old female patient with a past medical history of multiple pneumonias, kidney stones status post ureteroscopy in 2024 presenting today to the pulmonary clinic as a referral from [facility] for further evaluation of bronchiectasis found incidentally on CT abdomen and pelvis.  She presented to [facility] in January 2024 with right flank pain and was found to have nephrolithiasis. As part of the work up she underwent a CT a/p and did show RML bronchiectasis with associated consolidative opacity.   She is overall doing well from a respiratory standpoint.  She does sometimes feel like she has a cold in her chest but no pain.  Intermittently wheezes.  She denies any dyspnea unless it is uphill.  She did have multiple pneumonias in the past.  She does report symptoms of GERD and takes Pepcid for that.  Family history -father passed away of emphysema and was a heavy smoker.  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  ROS All symptoms were reviewed and are negative except for the above . Objective:   Vitals:   02/27/23 1021  BP: 110/66  Pulse: 75  Temp: 98.2 F (36.8 C)  TempSrc: Temporal  SpO2: 98%  Weight: 139 lb 6.4 oz (63.2 kg)  Height: 5' 2.5" (1.588 m)   98% on RA BMI Readings from Last 3 Encounters:  02/27/23 25.09 kg/m  01/29/23 24.77 kg/m  01/01/23 24.30 kg/m   Wt Readings from Last 3 Encounters:  02/27/23 139 lb 6.4 oz (63.2 kg)  01/29/23 137 lb 9.6 oz (62.4 kg)  01/01/23 135 lb (61.2 kg)    Physical Exam GEN: NAD, Healthy Appearing HEENT: Supple Neck, Reactive Pupils, EOMI  CVS: Normal S1, Normal S2, RRR, No murmurs or ES  appreciated  Lungs: Faint bibasilar crackles appreciated Abdomen: Soft, non tender, non distended, + BS  Extremities: Warm and well perfused, No edema  Skin: No suspicious lesions appreciated  Psych: Normal Affect  Labs and imaging were reviewed Ancillary Information   CBC    Component Value Date/Time   WBC 5.0 01/01/2023 1014   RBC 4.90 01/01/2023 1014   HGB 15.2 (H) 01/01/2023 1014   HGB 14.2 01/25/2019 1040   HCT 44.8 01/01/2023 1014   HCT 40.6 01/25/2019 1040   PLT 289.0 01/01/2023 1014   PLT 304 01/25/2019 1040   MCV 91.3 01/01/2023 1014   MCV 90 01/25/2019 1040   MCH 31.0 04/05/2022 1449   MCHC 34.0 01/01/2023 1014   RDW 13.6 01/01/2023 1014   RDW 13.1 01/25/2019 1040   LYMPHSABS 1.8 01/01/2023 1014   LYMPHSABS 1.5 04/25/2016 0813   MONOABS 0.6 01/01/2023 1014   EOSABS 0.4 01/01/2023 1014   EOSABS 0.3 04/25/2016 0813   BASOSABS 0.1 01/01/2023 1014   BASOSABS 0.0 04/25/2016 0813       No data to display           Assessment & Plan:  Elizabeth Mills is a 77 year old female patient with a past medical history of multiple pneumonias, kidney stones status post ureteroscopy in 2024 presenting today to the pulmonary clinic as a referral from [facility] for further evaluation of bronchiectasis  found incidentally on CT abdomen and pelvis.  # Right middle lobe bronchiectasis with consolidative opacity Seen incidentally on CT abdomen and pelvis for nephrolithiasis.  It is located in the right middle lobe with differential diagnosis including [place] syndrome (NTM), sequela of prior community-acquired pneumonia, immunoglobulin deficiency.  Will obtain a CT chest without contrast and will discuss the need for bronchoscopy for further evaluation.  If this is NTM, no treatment is required at this point given lack of symptoms..  []  High-res CT chest []  Pulmonary function test []  Immunoglobulin (IgG, IgA, IgM)   #GERD Advised on lifestyle modification.  Start  pantoprazole 40 mg p.o. daily.  Return in about 3 months (around 05/28/2023).  I spent 60 minutes caring for this patient today, including preparing to see the patient, obtaining a medical history , reviewing a separately obtained history, performing a medically appropriate examination and/or evaluation, counseling and educating the patient/family/caregiver, ordering medications, tests, or procedures, documenting clinical information in the electronic health record, and independently interpreting results (not separately reported/billed) and communicating results to the patient/family/caregiver  Janann Colonel, MD [facility] 02/27/2023 12:13 PM

## 2023-03-01 LAB — IGG, IGA, IGM
IgA: 297 mg/dL (ref 64–422)
IgG (Immunoglobin G), Serum: 1035 mg/dL (ref 586–1602)
IgM (Immunoglobulin M), Srm: 110 mg/dL (ref 26–217)

## 2023-03-26 ENCOUNTER — Other Ambulatory Visit: Payer: Self-pay

## 2023-03-26 MED ORDER — ROSUVASTATIN CALCIUM 40 MG PO TABS: 40.0000 mg | ORAL_TABLET | ORAL | 3 refills | Status: DC

## 2023-03-31 ENCOUNTER — Ambulatory Visit
Admission: RE | Admit: 2023-03-31 | Discharge: 2023-03-31 | Disposition: A | Discharge status: Home / Self Care | Payer: Medicare Other | Source: Ambulatory Visit | Attending: Internal Medicine | Admitting: Internal Medicine

## 2023-03-31 DIAGNOSIS — J479 Bronchiectasis, uncomplicated: Secondary | ICD-10-CM | POA: Diagnosis present

## 2023-04-15 ENCOUNTER — Other Ambulatory Visit (HOSPITAL_COMMUNITY): Payer: Self-pay

## 2023-04-15 ENCOUNTER — Telehealth: Payer: Self-pay

## 2023-04-15 NOTE — Telephone Encounter (Signed)
Pharmacy Patient Advocate Encounter   Received notification from Fax that prior authorization for Advair Diskus 250-50MCG/ACT aerosol powder  is required/requested.   Insurance verification completed.   The patient is insured through [facility] .   Per test claim: The current 90 day co-pay is, $625.87.  No PA needed at this time. This test claim was processed through [facility]- copay amounts may vary at other pharmacies due to pharmacy/plan contracts, or as the patient moves through the different stages of their insurance plan.

## 2023-04-21 ENCOUNTER — Ambulatory Visit: Payer: Medicare Other | Admitting: Internal Medicine

## 2023-04-21 ENCOUNTER — Ambulatory Visit: Payer: Medicare Other

## 2023-04-21 ENCOUNTER — Ambulatory Visit (INDEPENDENT_AMBULATORY_CARE_PROVIDER_SITE_OTHER): Payer: Medicare Other

## 2023-04-21 ENCOUNTER — Encounter: Payer: Self-pay | Admitting: Internal Medicine

## 2023-04-21 ENCOUNTER — Other Ambulatory Visit: Payer: Self-pay | Admitting: Pulmonary Disease

## 2023-04-21 VITALS — BP 130/70 | HR 74 | Temp 98.4°F | Ht 62.5 in | Wt 141.0 lb

## 2023-04-21 DIAGNOSIS — E039 Hypothyroidism, unspecified: Secondary | ICD-10-CM

## 2023-04-21 DIAGNOSIS — J4531 Mild persistent asthma with (acute) exacerbation: Secondary | ICD-10-CM | POA: Diagnosis not present

## 2023-04-21 DIAGNOSIS — K219 Gastro-esophageal reflux disease without esophagitis: Secondary | ICD-10-CM

## 2023-04-21 DIAGNOSIS — J45909 Unspecified asthma, uncomplicated: Secondary | ICD-10-CM | POA: Insufficient documentation

## 2023-04-21 LAB — POC COVID19 BINAXNOW: SARS Coronavirus 2 Ag: NEGATIVE

## 2023-04-21 LAB — POC INFLUENZA A&B (BINAX/QUICKVUE)
Influenza A, POC: NEGATIVE
Influenza B, POC: NEGATIVE

## 2023-04-21 LAB — TSH: TSH: 1 u[IU]/mL (ref 0.35–5.50)

## 2023-04-21 MED ORDER — AMOXICILLIN-POT CLAVULANATE 875-125 MG PO TABS
1.0000 | ORAL_TABLET | Freq: Two times a day (BID) | ORAL | 0 refills | Status: AC
Start: 2023-04-21 — End: 2023-04-28

## 2023-04-21 NOTE — Progress Notes (Unsigned)
Subjective:    Patient ID: Elizabeth Mills, female    DOB: 05/01/1945, 78 y.o.   MRN: [id number]      HPI Elizabeth Mills is here for  Chief Complaint  Patient presents with   Nasal Congestion    Started with sore throat on Wednesday and progressed into congestion in her chest; Chest hurts where she has coughed so much; feels short of breath; cough kept her up last night     She is here for an acute visit for cold symptoms.   Her symptoms started thursday  She is experiencing decreased appetite, low grade fever, PND, cough that is productive of yellow mucus, mild SOB, wheeze, diarrhea one day and dizziness.  No chest pain, sinus pain or sore throat  She has tried taking  asthma meds, mucinex.  Wheezing not getting better.    Medications and allergies reviewed with patient and updated if appropriate.  Current Outpatient Medications on File Prior to Visit  Medication Sig Dispense Refill   albuterol (VENTOLIN HFA) 108 (90 Base) MCG/ACT inhaler USE 1 TO 2 INHALATIONS BY MOUTH  EVERY 4 HOURS AS NEEDED FOR  WHEEZING OR SHORTNESS OF BREATH 51 g 3   estradiol (ESTRACE) 0.1 MG/GM vaginal cream PLACE 1 APPLICATORFUL  VAGINALLY WEEKLY 85 g 3   famotidine (PEPCID) 20 MG tablet Take 20 mg by mouth daily.     fluticasone (FLONASE) 50 MCG/ACT nasal spray PLACE 2 SPRAYS INTO BOTH NOSTRILS DAILY AS NEEDED FOR ALLERGIES OR RHINITIS (ALLERGIES). 16 mL 1   fluticasone-salmeterol (ADVAIR) 250-50 MCG/ACT AEPB USE 1 INHALATION BY MOUTH EVERY  12 HOURS 180 each 3   loratadine (CLARITIN) 10 MG tablet Take 10 mg by mouth daily.     metoprolol succinate (TOPROL-XL) 25 MG 24 hr tablet TAKE ONE-HALF TABLET BY  MOUTH DAILY , MAY ALSO TAKE AN EXTRA ONE-HALF TABLET BY MOUTH DAILY AS NEEDED FOR  PALPITATIONS 90 tablet 2   Multiple Vitamin (MULTIVITAMIN) tablet Take 1 tablet by mouth daily.     ondansetron (ZOFRAN) 4 MG tablet Take 1 tablet (4 mg total) by mouth every 6 (six) hours. 15 tablet 0    oxyCODONE-acetaminophen (PERCOCET/ROXICET) 5-325 MG tablet Take 1 tablet by mouth every 6 (six) hours as needed for severe pain. 15 tablet 0   pantoprazole (PROTONIX) 40 MG tablet Take 1 tablet (40 mg total) by mouth daily. 30 tablet 1   rosuvastatin (CRESTOR) 40 MG tablet Take 1 tablet (40 mg total) by mouth 3 (three) times a week. 45 tablet 3   SYNTHROID 75 MCG tablet TAKE 1 TABLET BY MOUTH  DAILY  6 DAYS A WEEK, TAKE 0.5 TABLET PO 1 DAY A WEEK BEFORE BREAKFAST     tamsulosin (FLOMAX) 0.4 MG CAPS capsule Take 1 capsule (0.4 mg total) by mouth daily. 30 capsule 0   Vitamin D, Cholecalciferol, 25 MCG (1000 UT) TABS Take 3,000 Units by mouth daily.  gummies     No current facility-administered medications on file prior to visit.    Review of Systems  Constitutional:  Positive for appetite change (dec) and fever (low grade).  HENT:  Positive for postnasal drip. Negative for congestion, ear pain, sinus pressure, sinus pain and sore throat.   Respiratory:  Positive for cough (productive - yellow mucus), shortness of breath (mild) and wheezing. Negative for chest tightness.   Cardiovascular:  Negative for chest pain.  Gastrointestinal:  Positive for diarrhea (one day). Negative for nausea.  Musculoskeletal:  Negative for myalgias.  Neurological:  Positive for dizziness. Negative for light-headedness and headaches.       Objective:   Vitals:   04/21/23 1441  BP: 116/72  Pulse: 74  Temp: 98.4 F (36.9 C)  SpO2: 95%   BP Readings from Last 3 Encounters:  04/21/23 116/72  02/27/23 110/66  01/29/23 114/64   Wt Readings from Last 3 Encounters:  04/21/23 141 lb (64 kg)  02/27/23 139 lb 6.4 oz (63.2 kg)  01/29/23 137 lb 9.6 oz (62.4 kg)   Body mass index is 25.38 kg/m.    Physical Exam Constitutional:      General: She is not in acute distress.    Appearance: Normal appearance. She is not ill-appearing.  HENT:     Head: Normocephalic and atraumatic.     Right Ear: Tympanic  membrane, ear canal and external ear normal.     Left Ear: Tympanic membrane, ear canal and external ear normal.     Mouth/Throat:     Mouth: Mucous membranes are moist.     Pharynx: No oropharyngeal exudate or posterior oropharyngeal erythema.  Eyes:     Conjunctiva/sclera: Conjunctivae normal.  Cardiovascular:     Rate and Rhythm: Normal rate and regular rhythm.  Pulmonary:     Effort: Pulmonary effort is normal. No respiratory distress.     Breath sounds: Wheezing (expiratory wheeze - very mild, diffuse) present. No rales.  Musculoskeletal:     Cervical back: Neck supple. No tenderness.  Lymphadenopathy:     Cervical: No cervical adenopathy.  Skin:    General: Skin is warm and dry.  Neurological:     Mental Status: She is alert.        DG Chest 2 View CLINICAL DATA:  Cough, wheezing.  EXAM: CHEST - 2 VIEW  COMPARISON:  January 21, 2022.  FINDINGS: The heart size and mediastinal contours are within normal limits. No definite acute pulmonary abnormality is noted. The visualized skeletal structures are unremarkable.  IMPRESSION: No definite acute cardiopulmonary abnormality seen.  Aortic Atherosclerosis (ICD10-I70.0).  Electronically Signed   By: Lupita Raider M.D.   On: 04/21/2023 15:57      Assessment & Plan:    See Problem List for Assessment and Plan of chronic medical problems.

## 2023-04-21 NOTE — Patient Instructions (Addendum)
     Chest xray today    Medications changes include :   Augmentin    Continue your inhalers.       Return if symptoms worsen or fail to improve.

## 2023-04-21 NOTE — Assessment & Plan Note (Signed)
Chronic  Clinically euthyroid Due for tsh - will get that today and will titrate med dose if needed Currently taking Synthroid 75 mcg daily

## 2023-04-21 NOTE — Assessment & Plan Note (Signed)
Acute Concern for bacterial infection with asthma exacerbation Cxr today w/o PNA Start Augmentin 875-125 mg BID x 7 day Continue inhalers Will hold off on prednisone unless she is not improving otc cold medications Rest, fluid Call if no improvement

## 2023-04-22 ENCOUNTER — Encounter: Payer: Self-pay | Admitting: Internal Medicine

## 2023-04-30 ENCOUNTER — Telehealth: Payer: Self-pay | Admitting: Physician Assistant

## 2023-04-30 ENCOUNTER — Other Ambulatory Visit: Payer: Self-pay | Admitting: Internal Medicine

## 2023-04-30 MED ORDER — METOPROLOL SUCCINATE ER 25 MG PO TB24: ORAL_TABLET | ORAL | 3 refills | Status: DC

## 2023-04-30 NOTE — Telephone Encounter (Signed)
*  STAT* If patient is at the pharmacy, call can be transferred to refill team.   1. Which medications need to be refilled? (please list name of each medication and dose if known) metoprolol  succinate (TOPROL -XL) 25 MG 24 hr tablet    2. Would you like to learn more about the convenience, safety, & potential cost savings by using the [facility]?      3. Are you open to using the [facility] (Type [facility]. ).   4. Which pharmacy/location (including street and city if local pharmacy) is medication to be sent to? [facility] - [place], [place] - [address]    5. Do they need a 30 day or 90 day supply? 90

## 2023-04-30 NOTE — Telephone Encounter (Signed)
 Copied from CRM [id number]. Topic: Clinical - Medication Refill >> Apr 30, 2023 11:17 AM [name] wrote: Most Recent Primary Care Visit:  Provider: BURNS, GLADE PARAS  Department: [facility]  Visit Type: ACUTE  Date: 04/21/2023  Medication: Amoxicillin  CLAV 875-125mg    Has the patient contacted their pharmacy? Yes (Agent: If no, request that the patient contact the pharmacy for the refill. If patient does not wish to contact the pharmacy document the reason why and proceed with request.) (Agent: If yes, when and what did the pharmacy advise?)  Is this the correct pharmacy for this prescription? Yes If no, delete pharmacy and type the correct one.  This is the patient's preferred pharmacy:  [facility] [id number] [address], [place] - [address] [place] [address] Phone: [phone] Fax: [phone]  Has the prescription been filled recently? Yes  Is the patient out of the medication? Yes  Has the patient been seen for an appointment in the last year OR does the patient have an upcoming appointment? Yes  Can we respond through MyChart? No  Agent: Please be advised that Rx refills may take up to 3 business days. We ask that you follow-up with your pharmacy.

## 2023-05-20 ENCOUNTER — Other Ambulatory Visit: Payer: Self-pay

## 2023-05-20 DIAGNOSIS — K219 Gastro-esophageal reflux disease without esophagitis: Secondary | ICD-10-CM

## 2023-05-20 MED ORDER — PANTOPRAZOLE SODIUM 40 MG PO TBEC: 40.0000 mg | DELAYED_RELEASE_TABLET | Freq: Every day | ORAL | 1 refills | Status: DC

## 2023-05-21 ENCOUNTER — Ambulatory Visit: Payer: Medicare Other

## 2023-05-21 ENCOUNTER — Encounter: Payer: Self-pay | Admitting: *Deleted

## 2023-05-21 ENCOUNTER — Encounter: Payer: Self-pay | Admitting: Family

## 2023-05-21 ENCOUNTER — Ambulatory Visit: Payer: Medicare Other | Admitting: Family

## 2023-05-21 VITALS — BP 110/60 | HR 80 | Temp 98.6°F | Ht 63.0 in | Wt 141.0 lb

## 2023-05-21 DIAGNOSIS — N200 Calculus of kidney: Secondary | ICD-10-CM | POA: Diagnosis not present

## 2023-05-21 DIAGNOSIS — R1031 Right lower quadrant pain: Secondary | ICD-10-CM

## 2023-05-21 DIAGNOSIS — R82998 Other abnormal findings in urine: Secondary | ICD-10-CM | POA: Diagnosis not present

## 2023-05-21 DIAGNOSIS — R300 Dysuria: Secondary | ICD-10-CM | POA: Diagnosis not present

## 2023-05-21 LAB — POCT URINE DIPSTICK
Bilirubin, UA: NEGATIVE
Blood, UA: NEGATIVE
Glucose, UA: NEGATIVE mg/dL
Ketones, POC UA: NEGATIVE mg/dL
Nitrite, UA: NEGATIVE
Spec Grav, UA: 1.02 (ref 1.010–1.025)
Urobilinogen, UA: 0.2 U/dL
pH, UA: 6 (ref 5.0–8.0)

## 2023-05-21 MED ORDER — CIPROFLOXACIN HCL 500 MG PO TABS: 500.0000 mg | ORAL_TABLET | Freq: Two times a day (BID) | ORAL | 0 refills | Status: AC

## 2023-05-21 NOTE — Assessment & Plan Note (Signed)
 poct urine dip in office Urine culture ordered pending results antbx sent to pharmacy, pt to take as directed. Encouraged increased water intake throughout the day. Choosing to treat due to being symptomatic. If no improvement in the next 2 days pt advised to let me know.  Rx cipo 500 mg bid x 3 days

## 2023-05-21 NOTE — Assessment & Plan Note (Signed)
 Concern for repeat kidney stone as similar symptoms in the past.  I have ordred ct for a renal stone study however pt declines Red flag symptoms given to pt and when to seek more urgent care.

## 2023-05-21 NOTE — Progress Notes (Signed)
 Established Patient Office Visit  Subjective:   Patient ID: Elizabeth Mills, female    DOB: 1945-09-15  Age: 78 y.o. MRN: [id number]  CC:  Chief Complaint  Patient presents with   Acute Visit    Noticed blood in her urine yesterday, burning with urination as well.    HPI: Elizabeth Mills is a 78 y.o. female presenting on 05/21/2023 for Acute Visit (Noticed blood in her urine yesterday, burning with urination as well.)  Yesterday started with blood in her urine  She has also noticed urinary frequency and retention in the last few days prior to seeing blood. She has been experiencing pretty significant dysuria. She has a slight lower abdominal pelvic pain that is intermittent. She does note intermittent right flank pain, slight this am, dull and achy not sharp and stabbing.   She does have h/o kidney stones, starting to drink lemonade with improvement.   CT renal stone study 1/13 with 9 mm kidney stone of which has been stented and also noted were several non obstructive left sided nephrolithiasis      ROS: Negative unless specifically indicated above in HPI.   Relevant past medical history reviewed and updated as indicated.   Allergies and medications reviewed and updated.   Current Outpatient Medications:    albuterol (VENTOLIN HFA) 108 (90 Base) MCG/ACT inhaler, USE 1 TO 2 INHALATIONS BY MOUTH  EVERY 4 HOURS AS NEEDED FOR  WHEEZING OR SHORTNESS OF BREATH, Disp: 51 g, Rfl: 3   ciprofloxacin (CIPRO) 500 MG tablet, Take 1 tablet (500 mg total) by mouth 2 (two) times daily for 3 days., Disp: 6 tablet, Rfl: 0   estradiol (ESTRACE) 0.1 MG/GM vaginal cream, PLACE 1 APPLICATORFUL  VAGINALLY WEEKLY, Disp: 85 g, Rfl: 3   famotidine (PEPCID) 20 MG tablet, Take 20 mg by mouth daily., Disp: , Rfl:    fluticasone (FLONASE) 50 MCG/ACT nasal spray, PLACE 2 SPRAYS INTO BOTH NOSTRILS DAILY AS NEEDED FOR ALLERGIES OR RHINITIS (ALLERGIES)., Disp: 16 mL, Rfl: 1    fluticasone-salmeterol (ADVAIR) 250-50 MCG/ACT AEPB, USE 1 INHALATION BY MOUTH EVERY  12 HOURS, Disp: 180 each, Rfl: 3   loratadine (CLARITIN) 10 MG tablet, Take 10 mg by mouth daily., Disp: , Rfl:    metoprolol succinate (TOPROL-XL) 25 MG 24 hr tablet, TAKE ONE-HALF TABLET BY  MOUTH DAILY , MAY ALSO TAKE AN EXTRA ONE-HALF TABLET BY MOUTH DAILY AS NEEDED FOR  PALPITATIONS, Disp: 90 tablet, Rfl: 3   Multiple Vitamin (MULTIVITAMIN) tablet, Take 1 tablet by mouth daily., Disp: , Rfl:    pantoprazole (PROTONIX) 40 MG tablet, Take 1 tablet (40 mg total) by mouth daily. Dx K21.9, Disp: 90 tablet, Rfl: 1   rosuvastatin (CRESTOR) 40 MG tablet, Take 1 tablet (40 mg total) by mouth 3 (three) times a week., Disp: 45 tablet, Rfl: 3   SYNTHROID 75 MCG tablet, TAKE 1 TABLET BY MOUTH  DAILY  6 DAYS A WEEK, TAKE 0.5 TABLET PO 1 DAY A WEEK BEFORE BREAKFAST, Disp: , Rfl:    tamsulosin (FLOMAX) 0.4 MG CAPS capsule, Take 1 capsule (0.4 mg total) by mouth daily., Disp: 30 capsule, Rfl: 0   Vitamin D, Cholecalciferol, 25 MCG (1000 UT) TABS, Take 3,000 Units by mouth daily.  gummies, Disp: , Rfl:    ondansetron (ZOFRAN) 4 MG tablet, Take 1 tablet (4 mg total) by mouth every 6 (six) hours. (Patient not taking: Reported on 05/21/2023), Disp: 15 tablet, Rfl: 0   oxyCODONE-acetaminophen (PERCOCET/ROXICET) 5-325 MG  tablet, Take 1 tablet by mouth every 6 (six) hours as needed for severe pain. (Patient not taking: Reported on 05/21/2023), Disp: 15 tablet, Rfl: 0  Allergies  Allergen Reactions   Crestor [Rosuvastatin Calcium] Other (See Comments)    Gi issues Taking three time a week   Lipitor [Atorvastatin Calcium]     Gi symp   Phenergan [Promethazine Hcl] Other (See Comments)    Father and son are highly allergic so patient doesn't take it   Pravachol Other (See Comments)    Gi issues   Promethazine Other (See Comments)    Other reaction(s): Other (See Comments) Father and son have AMS with Phenergan - patient afraid to  take Father and son have AMS with Phenergan - patient afraid to take   Promethazine Hcl    Sulfa Antibiotics Other (See Comments)    dizziness   Sulfamethoxazole Other (See Comments)    Other reaction(s): Dizziness (intolerance) Disoriented Disoriented   Zetia [Ezetimibe] Other (See Comments)    Gi issues   Zocor [Simvastatin - High Dose] Other (See Comments)    Gi issues   Pseudoephedrine Palpitations    Objective:   BP 110/60 (BP Location: Left Arm, Patient Position: Sitting, Cuff Size: Normal)   Pulse 80   Temp 98.6 F (37 C) (Temporal)   Ht 5\' 3"  (1.6 m)   Wt 141 lb (64 kg)   SpO2 98%   BMI 24.98 kg/m    Physical Exam Constitutional:      General: She is not in acute distress.    Appearance: Normal appearance. She is normal weight. She is not ill-appearing, toxic-appearing or diaphoretic.  Cardiovascular:     Rate and Rhythm: Normal rate.  Pulmonary:     Effort: Pulmonary effort is normal.  Abdominal:     General: Abdomen is flat.     Tenderness: There is abdominal tenderness (pelvic). There is right CVA tenderness. There is no left CVA tenderness.  Neurological:     General: No focal deficit present.     Mental Status: She is alert and oriented to person, place, and time. Mental status is at baseline.     Motor: No weakness.  Psychiatric:        Mood and Affect: Mood normal.        Behavior: Behavior normal.        Thought Content: Thought content normal.        Judgment: Judgment normal.     Assessment & Plan:  Burning with urination -     POCT URINE DIPSTICK -     Urinalysis w microscopic + reflex cultur  Right lower quadrant abdominal pain -     CT RENAL STONE STUDY; Future  Nephrolithiasis Assessment & Plan: Concern for repeat kidney stone as similar symptoms in the past.  I have ordred ct for a renal stone study however pt declines Red flag symptoms given to pt and when to seek more urgent care.    Orders: -     Ciprofloxacin HCl; Take 1  tablet (500 mg total) by mouth 2 (two) times daily for 3 days.  Dispense: 6 tablet; Refill: 0  Leukocytes in urine Assessment & Plan: poct urine dip in office Urine culture ordered pending results antbx sent to pharmacy, pt to take as directed. Encouraged increased water intake throughout the day. Choosing to treat due to being symptomatic. If no improvement in the next 2 days pt advised to let me know.  Rx cipo 500 mg  bid x 3 days   Orders: -     Ciprofloxacin HCl; Take 1 tablet (500 mg total) by mouth 2 (two) times daily for 3 days.  Dispense: 6 tablet; Refill: 0     Follow up plan: Return for f/u PCP if no improvement in symptoms.  Mort Sawyers, FNP

## 2023-05-22 ENCOUNTER — Encounter: Payer: Self-pay | Admitting: Family

## 2023-05-22 ENCOUNTER — Ambulatory Visit
Admission: RE | Admit: 2023-05-22 | Discharge: 2023-05-22 | Disposition: A | Discharge status: Home / Self Care | Payer: Medicare Other | Source: Ambulatory Visit | Attending: Family | Admitting: Family

## 2023-05-22 DIAGNOSIS — R1031 Right lower quadrant pain: Secondary | ICD-10-CM | POA: Diagnosis present

## 2023-05-23 LAB — URINALYSIS W MICROSCOPIC + REFLEX CULTURE
Bacteria, UA: NONE SEEN /HPF
Bilirubin Urine: NEGATIVE
Glucose, UA: NEGATIVE
Hgb urine dipstick: NEGATIVE
Hyaline Cast: NONE SEEN /LPF
Ketones, ur: NEGATIVE
Nitrites, Initial: NEGATIVE
Protein, ur: NEGATIVE
RBC / HPF: NONE SEEN /HPF (ref 0–2)
Specific Gravity, Urine: 1.02 (ref 1.001–1.035)
pH: 6 (ref 5.0–8.0)

## 2023-05-23 LAB — URINE CULTURE
MICRO NUMBER:: 16141285
Result:: NO GROWTH
SPECIMEN QUALITY:: ADEQUATE

## 2023-05-23 LAB — CULTURE INDICATED

## 2023-05-25 ENCOUNTER — Encounter: Payer: Self-pay | Admitting: Pulmonary Disease

## 2023-05-25 ENCOUNTER — Other Ambulatory Visit
Admission: RE | Admit: 2023-05-25 | Discharge: 2023-05-25 | Disposition: A | Discharge status: Home / Self Care | Attending: Pulmonary Disease | Admitting: Pulmonary Disease

## 2023-05-25 ENCOUNTER — Ambulatory Visit: Payer: Medicare Other | Admitting: Pulmonary Disease

## 2023-05-25 ENCOUNTER — Encounter: Payer: Self-pay | Admitting: Family

## 2023-05-25 VITALS — BP 116/62 | HR 72 | Temp 96.9°F | Ht 63.0 in | Wt 139.6 lb

## 2023-05-25 DIAGNOSIS — J4540 Moderate persistent asthma, uncomplicated: Secondary | ICD-10-CM

## 2023-05-25 DIAGNOSIS — K2100 Gastro-esophageal reflux disease with esophagitis, without bleeding: Secondary | ICD-10-CM

## 2023-05-25 DIAGNOSIS — R051 Acute cough: Secondary | ICD-10-CM

## 2023-05-25 LAB — CBC WITH DIFFERENTIAL/PLATELET
Abs Immature Granulocytes: 0.02 10*3/uL (ref 0.00–0.07)
Basophils Absolute: 0 10*3/uL (ref 0.0–0.1)
Basophils Relative: 1 %
Eosinophils Absolute: 0.3 10*3/uL (ref 0.0–0.5)
Eosinophils Relative: 7 %
HCT: 43 % (ref 36.0–46.0)
Hemoglobin: 14.8 g/dL (ref 12.0–15.0)
Immature Granulocytes: 0 %
Lymphocytes Relative: 31 %
Lymphs Abs: 1.4 10*3/uL (ref 0.7–4.0)
MCH: 31.3 pg (ref 26.0–34.0)
MCHC: 34.4 g/dL (ref 30.0–36.0)
MCV: 90.9 fL (ref 80.0–100.0)
Monocytes Absolute: 0.6 10*3/uL (ref 0.1–1.0)
Monocytes Relative: 12 %
Neutro Abs: 2.2 10*3/uL (ref 1.7–7.7)
Neutrophils Relative %: 49 %
Platelets: 271 10*3/uL (ref 150–400)
RBC: 4.73 MIL/uL (ref 3.87–5.11)
RDW: 12.8 % (ref 11.5–15.5)
WBC: 4.5 10*3/uL (ref 4.0–10.5)
nRBC: 0 % (ref 0.0–0.2)

## 2023-05-25 LAB — NITRIC OXIDE: Nitric Oxide: 11

## 2023-05-25 MED ORDER — PANTOPRAZOLE SODIUM 40 MG PO TBEC: 40.0000 mg | DELAYED_RELEASE_TABLET | Freq: Every day | ORAL | 3 refills | Status: DC

## 2023-05-25 NOTE — Progress Notes (Unsigned)
 Synopsis: Referred in by Pincus Sanes, MD   Subjective:   PATIENT ID: Valarie Cones GENDER: female DOB: 1945-08-14, MRN: [id number]  Chief Complaint  Patient presents with  . Follow-up    No SOB. Wheezing. Cough with yellowish sputum.     HPI Ms. Older is a 78 year old female patient with a past medical history of multiple pneumonias, kidney stones status post ureteroscopy in 2024 presenting today to the pulmonary clinic as a referral from [facility] for further evaluation of bronchiectasis found incidentally on CT abdomen and pelvis.  She presented to [facility] in January 2024 with right flank pain and was found to have nephrolithiasis. As part of the work up she underwent a CT a/p and did show RML bronchiectasis with associated consolidative opacity.   She is overall doing well from a respiratory standpoint.  She does sometimes feel like she has a cold in her chest but no pain.  Intermittently wheezes.  She denies any dyspnea unless it is uphill.  She did have multiple pneumonias in the past.  She does report symptoms of GERD and takes Pepcid for that.  Family history -father passed away of emphysema and was a heavy smoker.  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  ROS All symptoms were reviewed and are negative except for the above . Objective:   Vitals:   05/25/23 1003  BP: 116/62  Pulse: 72  Temp: (!) 96.9 F (36.1 C)  SpO2: 97%  Weight: 139 lb 9.6 oz (63.3 kg)  Height: 5\' 3"  (1.6 m)   97% on RA BMI Readings from Last 3 Encounters:  05/25/23 24.73 kg/m  05/21/23 24.98 kg/m  04/21/23 25.38 kg/m   Wt Readings from Last 3 Encounters:  05/25/23 139 lb 9.6 oz (63.3 kg)  05/21/23 141 lb (64 kg)  04/21/23 141 lb (64 kg)    Physical Exam GEN: NAD, Healthy Appearing HEENT: Supple Neck, Reactive Pupils, EOMI  CVS: Normal S1, Normal S2, RRR, No murmurs or ES appreciated  Lungs: Faint bibasilar  crackles appreciated Abdomen: Soft, non tender, non distended, + BS  Extremities: Warm and well perfused, No edema  Skin: No suspicious lesions appreciated  Psych: Normal Affect  Labs and imaging were reviewed Ancillary Information   CBC    Component Value Date/Time   WBC 5.0 01/01/2023 1014   RBC 4.90 01/01/2023 1014   HGB 15.2 (H) 01/01/2023 1014   HGB 14.2 01/25/2019 1040   HCT 44.8 01/01/2023 1014   HCT 40.6 01/25/2019 1040   PLT 289.0 01/01/2023 1014   PLT 304 01/25/2019 1040   MCV 91.3 01/01/2023 1014   MCV 90 01/25/2019 1040   MCH 31.0 04/05/2022 1449   MCHC 34.0 01/01/2023 1014   RDW 13.6 01/01/2023 1014   RDW 13.1 01/25/2019 1040   LYMPHSABS 1.8 01/01/2023 1014   LYMPHSABS 1.5 04/25/2016 0813   MONOABS 0.6 01/01/2023 1014   EOSABS 0.4 01/01/2023 1014   EOSABS 0.3 04/25/2016 0813   BASOSABS 0.1 01/01/2023 1014   BASOSABS 0.0 04/25/2016 0813       No data to display           Assessment & Plan:  Ms. Rinella is a 78 year old female patient with a past medical history of multiple pneumonias, kidney stones status post ureteroscopy in 2024 presenting today to the pulmonary clinic as a referral from [facility] for further evaluation of bronchiectasis found incidentally on CT abdomen  and pelvis.  # Right middle lobe bronchiectasis with consolidative opacity Seen incidentally on CT abdomen and pelvis for nephrolithiasis.  It is located in the right middle lobe with differential diagnosis including [place] syndrome (NTM), sequela of prior community-acquired pneumonia, immunoglobulin deficiency.  Will obtain a CT chest without contrast and will discuss the need for bronchoscopy for further evaluation.  If this is NTM, no treatment is required at this point given lack of symptoms..  []  High-res CT chest []  Pulmonary function test []  Immunoglobulin (IgG, IgA, IgM)   #GERD Advised on lifestyle modification.  Start pantoprazole 40 mg p.o. daily.  No follow-ups  on file.  I spent 60 minutes caring for this patient today, including preparing to see the patient, obtaining a medical history , reviewing a separately obtained history, performing a medically appropriate examination and/or evaluation, counseling and educating the patient/family/caregiver, ordering medications, tests, or procedures, documenting clinical information in the electronic health record, and independently interpreting results (not separately reported/billed) and communicating results to the patient/family/caregiver  Janann Colonel, MD [facility] 05/25/2023 10:08 AM

## 2023-05-28 ENCOUNTER — Ambulatory Visit: Payer: Medicare Other | Admitting: Pulmonary Disease

## 2023-05-30 LAB — ALLERGEN PANEL (27) + IGE
Alternaria Alternata IgE: <0.1 kU/L
Aspergillus Fumigatus IgE: 0.12 kU/L — AB
Bahia Grass IgE: <0.1 kU/L
Bermuda Grass IgE: <0.1 kU/L
Cat Dander IgE: <0.1 kU/L
Cedar, Mountain IgE: <0.1 kU/L
Cladosporium Herbarum IgE: <0.1 kU/L
Cocklebur IgE: <0.1 kU/L
Cockroach, American IgE: <0.1 kU/L
Common Silver Birch IgE: <0.1 kU/L
D Farinae IgE: <0.1 kU/L
D Pteronyssinus IgE: <0.1 kU/L
Dog Dander IgE: <0.1 kU/L
Elm, American IgE: <0.1 kU/L
Hickory, White IgE: <0.1 kU/L
IgE (Immunoglobulin E), Serum: 32 [IU]/mL (ref 6–495)
Johnson Grass IgE: <0.1 kU/L
Kentucky Bluegrass IgE: <0.1 kU/L
Maple/Box Elder IgE: <0.1 kU/L
Mucor Racemosus IgE: <0.1 kU/L
Oak, White IgE: <0.1 kU/L
Penicillium Chrysogen IgE: <0.1 kU/L
Pigweed, Rough IgE: <0.1 kU/L
Plantain, English IgE: <0.1 kU/L
Ragweed, Short IgE: <0.1 kU/L
Setomelanomma Rostrat: <0.1 kU/L
Timothy Grass IgE: <0.1 kU/L
White Mulberry IgE: <0.1 kU/L

## 2023-06-26 ENCOUNTER — Encounter: Payer: Self-pay | Admitting: Pulmonary Disease

## 2023-06-30 ENCOUNTER — Ambulatory Visit (INDEPENDENT_AMBULATORY_CARE_PROVIDER_SITE_OTHER)

## 2023-06-30 VITALS — Ht 63.0 in | Wt 139.0 lb

## 2023-06-30 DIAGNOSIS — Z0000 Encounter for general adult medical examination without abnormal findings: Secondary | ICD-10-CM

## 2023-06-30 NOTE — Patient Instructions (Signed)
 Ms. Borden , Thank you for taking time to come for your Medicare Wellness Visit. I appreciate your ongoing commitment to your health goals. Please review the following plan we discussed and let me know if I can assist you in the future.   Referrals/Orders/Follow-Ups/Clinician Recommendations: It was nice talking with you today.  You are due for a Shingles vaccine.  Keep up the good work.    This is a list of the screening recommended for you and due dates:  Health Maintenance  Topic Date Due   Zoster (Shingles) Vaccine (1 of 2) Never done   COVID-19 Vaccine (6 - 2024-25 season) 11/23/2022   Flu Shot  10/23/2023   DTaP/Tdap/Td vaccine (3 - Td or Tdap) 12/31/2023   Medicare Annual Wellness Visit  06/29/2024   DEXA scan (bone density measurement)  12/15/2024   Pneumonia Vaccine  Completed   Hepatitis C Screening  Completed   HPV Vaccine  Aged Out   Colon Cancer Screening  Discontinued    Advanced directives: (Copy Requested) Please bring a copy of your health care power of attorney and living will to the office to be added to your chart at your convenience. You can mail to [facility] [address] [place], [place] [address] or email to [email or web address]  Next Medicare Annual Wellness Visit scheduled for next year: Yes

## 2023-06-30 NOTE — Progress Notes (Signed)
 Subjective:   Elizabeth Mills is a 78 y.o. who presents for a Medicare Wellness preventive visit.  Visit Complete: Virtual I connected with  Elizabeth Mills on 06/30/23 by a video and audio enabled telemedicine application and verified that I am speaking with the correct person using two identifiers.  Patient Location: Home  Provider Location: Home Office  I discussed the limitations of evaluation and management by telemedicine. The patient expressed understanding and agreed to proceed.  Vital Signs: Because this visit was a virtual/telehealth visit, some criteria may be missing or patient reported. Any vitals not documented were not able to be obtained and vitals that have been documented are patient reported.   Persons Participating in Visit: Patient.  AWV Questionnaire: Yes: Patient Medicare AWV questionnaire was completed by the patient on 06/23/2023; I have confirmed that all information answered by patient is correct and no changes since this date.  Cardiac Risk Factors include: advanced age (>22men, >65 women);dyslipidemia;Other (see comment), Risk factor comments: PSVT, Asthma     Objective:    Today's Vitals   06/30/23 0838  Weight: 139 lb (63 kg)  Height: 5\' 3"  (1.6 m)   Body mass index is 24.62 kg/m.     06/30/2023    8:59 AM 04/09/2022    9:07 AM 04/08/2022   12:32 PM 04/05/2022    2:05 PM 11/12/2020   10:25 AM 08/27/2017    5:13 PM 08/28/2015    9:41 AM  Advanced Directives  Does Patient Have a Medical Advance Directive? Yes Yes Yes No Yes Yes Yes  Type of Estate agent of [place];Living will Living will;Healthcare Power of [employer] Power of [place];Living will  Living will;Healthcare Power of [employer] Power of [place];Living will   Does patient want to make changes to medical advance directive?  No - Patient declined   No - Patient declined    Copy of Healthcare Power of Attorney in Chart? No - copy requested  Yes - validated most recent copy scanned in chart (See row information) No - copy requested  No - copy requested No - copy requested   Would patient like information on creating a medical advance directive?  No - Patient declined  No - Patient declined       Current Medications (verified) Outpatient Encounter Medications as of 06/30/2023  Medication Sig   albuterol (VENTOLIN HFA) 108 (90 Base) MCG/ACT inhaler USE 1 TO 2 INHALATIONS BY MOUTH  EVERY 4 HOURS AS NEEDED FOR  WHEEZING OR SHORTNESS OF BREATH   estradiol (ESTRACE) 0.1 MG/GM vaginal cream PLACE 1 APPLICATORFUL  VAGINALLY WEEKLY   fluticasone (FLONASE) 50 MCG/ACT nasal spray PLACE 2 SPRAYS INTO BOTH NOSTRILS DAILY AS NEEDED FOR ALLERGIES OR RHINITIS (ALLERGIES).   fluticasone-salmeterol (ADVAIR) 250-50 MCG/ACT AEPB USE 1 INHALATION BY MOUTH EVERY  12 HOURS   loratadine (CLARITIN) 10 MG tablet Take 10 mg by mouth daily.   metoprolol succinate (TOPROL-XL) 25 MG 24 hr tablet TAKE ONE-HALF TABLET BY  MOUTH DAILY , MAY ALSO TAKE AN EXTRA ONE-HALF TABLET BY MOUTH DAILY AS NEEDED FOR  PALPITATIONS   Multiple Vitamin (MULTIVITAMIN) tablet Take 1 tablet by mouth daily.   pantoprazole (PROTONIX) 40 MG tablet Take 1 tablet (40 mg total) by mouth daily.   rosuvastatin (CRESTOR) 40 MG tablet Take 1 tablet (40 mg total) by mouth 3 (three) times a week.   SYNTHROID 75 MCG tablet TAKE 1 TABLET BY MOUTH  DAILY  6 DAYS A WEEK, TAKE  0.5 TABLET PO 1 DAY A WEEK BEFORE BREAKFAST   tamsulosin (FLOMAX) 0.4 MG CAPS capsule Take 1 capsule (0.4 mg total) by mouth daily.   Vitamin D, Cholecalciferol, 25 MCG (1000 UT) TABS Take 3,000 Units by mouth daily.  gummies   famotidine (PEPCID) 20 MG tablet Take 20 mg by mouth daily.   ondansetron (ZOFRAN) 4 MG tablet Take 1 tablet (4 mg total) by mouth every 6 (six) hours. (Patient not taking: Reported on 06/30/2023)   oxyCODONE-acetaminophen (PERCOCET/ROXICET) 5-325 MG tablet Take 1 tablet by mouth every 6 (six) hours as needed  for severe pain. (Patient not taking: Reported on 06/30/2023)   pantoprazole (PROTONIX) 40 MG tablet Take 1 tablet (40 mg total) by mouth daily. Dx K21.9 (Patient not taking: Reported on 05/25/2023)   No facility-administered encounter medications on file as of 06/30/2023.    Allergies (verified) Crestor [rosuvastatin calcium], Lipitor [atorvastatin calcium], Phenergan [promethazine hcl], Pravachol, Promethazine, Promethazine hcl, Sulfa antibiotics, Sulfamethoxazole, Zetia [ezetimibe], Zocor [simvastatin - high dose], and Pseudoephedrine   History: Past Medical History:  Diagnosis Date   Allergic rhinitis, cause unspecified    Anemia    Arthritis    Asthma    Back pain    Bilateral cataracts    Colitis    Diverticulitis    Generalized headaches    GERD (gastroesophageal reflux disease)    History of kidney stones    Hypercholesterolemia    Hypothyroidism    LVH (left ventricular hypertrophy)    Mild tricuspid regurgitation    Pneumonia    PSVT (paroxysmal supraventricular tachycardia) (HCC)    Statin intolerance    Past Surgical History:  Procedure Laterality Date   ABDOMINAL HYSTERECTOMY  1983   APPENDECTOMY  1955   BACK SURGERY     BREAST BIOPSY  1982   BREAST EXCISIONAL BIOPSY Bilateral    CATARACT EXTRACTION W/ INTRAOCULAR LENS IMPLANT Bilateral    COLONOSCOPY     CYSTOSCOPY/URETEROSCOPY/HOLMIUM LASER/STENT PLACEMENT Left 04/09/2022   Procedure: LEFT URETEROSCOPY/HOLMIUM LASER/STENT PLACEMENT;  Surgeon: Despina Arias, MD;  Location: [facility];  Service: Urology;  Laterality: Left;  1 HR FOR CASE   EYE SURGERY  11/14/2009, 1990   blocked tear duct repair   INNER EAR SURGERY     TONSILLECTOMY  1953   Family History  Problem Relation Age of Onset   Cancer Mother        Kidney and Ovarian Cancer   Arthritis Mother    Hyperlipidemia Mother    Arthritis Father    Hyperlipidemia Father    Breast cancer Neg Hx    Social History   Socioeconomic History   Marital  status: Married    Spouse name: Dorinda Hill   Number of children: 2   Years of education: 13   Highest education level: Associate degree: occupational, Scientist, product/process development, or vocational program  Occupational History   Occupation: Homemaker  Tobacco Use   Smoking status: Never    Passive exposure: Past   Smokeless tobacco: Never   Tobacco comments:    married, 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   Vaping status: Never Used  Substance and Sexual Activity   Alcohol use: No   Drug use: No   Sexual activity: Not Currently    Birth control/protection: Surgical  Other Topics Concern   Not on file  Social History Narrative   Regular exercise-yes, walk 2-3 times a week   Caffeine Use-no      Lives at home with husband and  1 dog   Social Drivers of Corporate investment banker Strain: Low Risk  (06/23/2023)   Overall Financial Resource Strain (CARDIA)    Difficulty of Paying Living Expenses: Not hard at all  Food Insecurity: No Food Insecurity (06/23/2023)   Hunger Vital Sign    Worried About Running Out of Food in the Last Year: Never true    Ran Out of Food in the Last Year: Never true  Transportation Needs: No Transportation Needs (06/23/2023)   PRAPARE - Administrator, Civil Service (Medical): No    Lack of Transportation (Non-Medical): No  Physical Activity: Insufficiently Active (06/23/2023)   Exercise Vital Sign    Days of Exercise per Week: 5 days    Minutes of Exercise per Session: 20 min  Stress: No Stress Concern Present (06/23/2023)   [employer] of Occupational Health - Occupational Stress Questionnaire    Feeling of Stress : Not at all  Social Connections: Socially Integrated (06/23/2023)   Social Connection and Isolation Panel [NHANES]    Frequency of Communication with Friends and Family: Twice a week    Frequency of Social Gatherings with Friends and Family: Once a week    Attends Religious Services: More than 4 times per year    Active Member of [employer] or  Organizations: Yes    Attends Engineer, structural: More than 4 times per year    Marital Status: Married    Tobacco Counseling Counseling given: Not Answered Tobacco comments: married, lives with spouse - retired    Clinical Intake:  Pre-visit preparation completed: Yes  Pain : No/denies pain     BMI - recorded: 24.62 Nutritional Status: BMI of 19-24  Normal Nutritional Risks: None Diabetes: No  Lab Results  Component Value Date   HGBA1C 5.5 01/01/2023   HGBA1C 5.5 10/28/2017     How often do you need to have someone help you when you read instructions, pamphlets, or other written materials from your doctor or pharmacy?: 1 - Never  Interpreter Needed?: No  Information entered by :: Eligah Anello, RMA   Activities of Daily Living     06/30/2023    8:41 AM  In your present state of health, do you have any difficulty performing the following activities:  Hearing? 0  Vision? 0  Difficulty concentrating or making decisions? 0  Walking or climbing stairs? 0  Dressing or bathing? 0  Doing errands, shopping? 0  Preparing Food and eating ? N  Using the Toilet? N  In the past six months, have you accidently leaked urine? N  Do you have problems with loss of bowel control? N  Managing your Medications? N  Managing your Finances? N  Housekeeping or managing your Housekeeping? N    Patient Care Team: Pincus Sanes, MD as PCP - General (Internal Medicine) Lyn Records, MD (Inactive) as PCP - Cardiology (Cardiology) Cassell Clement, MD (Cardiology) Gretta Cool, MD (Inactive) (Obstetrics and Gynecology) Bernette Redbird, MD (Gastroenterology) Mia Creek, MD as Consulting Physician (Ophthalmology) [facility] [place] as Consulting Physician (Optometry) Assaker, [place], MD as Consulting Physician (Pulmonary Disease)  Indicate any recent Medical Services you may have received from other than [facility] providers in the past year (date may  be approximate).     Assessment:   This is a routine wellness examination for [place].  Hearing/Vision screen Hearing Screening - Comments:: Denies hearing difficulties   Vision Screening - Comments:: Has eyeglasses for driving   Goals  Addressed             This Visit's Progress    Patient Stated   On track    I want take a day off during the week and do just what I want to do. Enjoy life and family.       Depression Screen     06/30/2023    9:03 AM 04/21/2023    4:24 PM 12/03/2021   11:16 AM 05/10/2021    2:07 PM 11/12/2020   10:24 AM 03/01/2020   10:33 AM 08/27/2017    5:13 PM  PHQ 2/9 Scores  PHQ - 2 Score 0 0 0 0 0 0 1  PHQ- 9 Score 1          Fall Risk     06/30/2023    9:00 AM 04/21/2023    4:23 PM 01/01/2023    9:34 AM 12/12/2021   10:53 AM 12/12/2021   10:18 AM  Fall Risk   Falls in the past year? 0 0 0 0 0  Number falls in past yr: 0 0 0 0 0  Injury with Fall? 0 1 0 0 0  Risk for fall due to : No Fall Risks No Fall Risks No Fall Risks No Fall Risks No Fall Risks  Follow up Falls prevention discussed;Falls evaluation completed Falls evaluation completed Falls evaluation completed Falls evaluation completed Falls evaluation completed    MEDICARE RISK AT HOME:  Medicare Risk at Home Any stairs in or around the home?: Yes If so, are there any without handrails?: Yes Home free of loose throw rugs in walkways, pet beds, electrical cords, etc?: Yes Adequate lighting in your home to reduce risk of falls?: Yes Life alert?: No Use of a cane, walker or w/c?: No Grab bars in the bathroom?: Yes Shower chair or bench in shower?: Yes Elevated toilet seat or a handicapped toilet?: Yes  TIMED UP AND GO:  Was the test performed?  No  Cognitive Function: 6CIT completed        06/30/2023    9:00 AM 12/03/2021   11:20 AM  6CIT Screen  What Year? 0 points 0 points  What month? 0 points 0 points  What time? 0 points 0 points  Count back from 20 0 points 0 points   Months in reverse 0 points 0 points  Repeat phrase 0 points 0 points  Total Score 0 points 0 points    Immunizations Immunization History  Administered Date(s) Administered   Fluad Quad(high Dose 65+) 03/01/2020, 02/05/2021   Influenza Split 12/23/2011   Influenza, High Dose Seasonal PF 02/18/2011, 12/26/2016, 02/24/2018   Influenza,inj,Quad PF,6+ Mos 03/10/2013, 12/30/2013, 03/03/2016, 12/23/2018   PFIZER Comirnaty(Gray Top)Covid-19 Tri-Sucrose Vaccine 02/05/2021   PFIZER(Purple Top)SARS-COV-2 Vaccination 04/18/2019, 05/09/2019, 02/13/2020, 09/25/2020   Pneumococcal Conjugate-13 01/25/2014   Pneumococcal Polysaccharide-23 03/22/2015   Pneumococcal-Unspecified 03/25/2007   Td 03/24/2002   Tdap 12/30/2013    Screening Tests Health Maintenance  Topic Date Due   Zoster Vaccines- Shingrix (1 of 2) Never done   COVID-19 Vaccine (6 - 2024-25 season) 11/23/2022   INFLUENZA VACCINE  10/23/2023   DTaP/Tdap/Td (3 - Td or Tdap) 12/31/2023   Medicare Annual Wellness (AWV)  06/29/2024   DEXA SCAN  12/15/2024   Pneumonia Vaccine 44+ Years old  Completed   Hepatitis C Screening  Completed   HPV VACCINES  Aged Out   Colonoscopy  Discontinued    Health Maintenance  Health Maintenance Due  Topic Date Due  Zoster Vaccines- Shingrix (1 of 2) Never done   COVID-19 Vaccine (6 - 2024-25 season) 11/23/2022   Health Maintenance Items Addressed: See Nurse Notes  Additional Screening:  Vision Screening: Recommended annual ophthalmology exams for early detection of glaucoma and other disorders of the eye.  Dental Screening: Recommended annual dental exams for proper oral hygiene  Community Resource Referral / Chronic Care Management: CRR required this visit?  No   CCM required this visit?  No     Plan:     I have personally reviewed and noted the following in the patient's chart:   Medical and social history Use of alcohol, tobacco or illicit drugs  Current medications and  supplements including opioid prescriptions. Patient is not currently taking opioid prescriptions. Functional ability and status Nutritional status Physical activity Advanced directives List of other physicians Hospitalizations, surgeries, and ER visits in previous 12 months Vitals Screenings to include cognitive, depression, and falls Referrals and appointments  In addition, I have reviewed and discussed with patient certain preventive protocols, quality metrics, and best practice recommendations. A written personalized care plan for preventive services as well as general preventive health recommendations were provided to patient.     Teri Diltz L Jaxson Anglin, CMA   06/30/2023   After Visit Summary: (MyChart) Due to this being a telephonic visit, the after visit summary with patients personalized plan was offered to patient via MyChart   Notes: Please refer to Routing Comments.

## 2023-08-04 ENCOUNTER — Telehealth: Payer: Self-pay

## 2023-08-04 NOTE — Telephone Encounter (Signed)
 I spoke with the patient. She was seen at Urgent Care on 5/4. She has finished her antibiotics and Prednisone . She still has a cough with green/yellow sputum and wheezing.   I scheduled her an appt with Dr. Lucina Sabal tomorrow at 9:00am.  Nothing further needed.

## 2023-08-04 NOTE — Telephone Encounter (Signed)
 Copied from CRM [id number]. Topic: Clinical - Medical Advice >> Aug 04, 2023  9:37 AM Elizabeth Mills wrote: Reason for CRM: Patient states she went to urgent care last Sunday for pneumonia, she would like to confirm how soon Dr.Assaker would like to see her.

## 2023-08-05 ENCOUNTER — Encounter: Payer: Self-pay | Admitting: Pulmonary Disease

## 2023-08-05 ENCOUNTER — Ambulatory Visit (INDEPENDENT_AMBULATORY_CARE_PROVIDER_SITE_OTHER): Admitting: Pulmonary Disease

## 2023-08-05 VITALS — BP 110/60 | HR 89 | Temp 96.4°F | Ht 63.0 in | Wt 136.4 lb

## 2023-08-05 DIAGNOSIS — J209 Acute bronchitis, unspecified: Secondary | ICD-10-CM

## 2023-08-05 DIAGNOSIS — J189 Pneumonia, unspecified organism: Secondary | ICD-10-CM

## 2023-08-05 DIAGNOSIS — K219 Gastro-esophageal reflux disease without esophagitis: Secondary | ICD-10-CM | POA: Diagnosis not present

## 2023-08-05 DIAGNOSIS — R918 Other nonspecific abnormal finding of lung field: Secondary | ICD-10-CM

## 2023-08-05 DIAGNOSIS — J479 Bronchiectasis, uncomplicated: Secondary | ICD-10-CM

## 2023-08-05 DIAGNOSIS — J4540 Moderate persistent asthma, uncomplicated: Secondary | ICD-10-CM

## 2023-08-05 NOTE — Progress Notes (Signed)
 Synopsis: Referred in by Colene Dauphin, MD   Subjective:   PATIENT ID: Elizabeth Mills GENDER: female DOB: 04/05/1945, MRN: [id number]  Chief Complaint  Patient presents with   Acute Visit    Saw Urgent Care on 5/4 for pneumonia. Wheezing. Cough with green/yellow sputum. Increased SOB.    HPI Elizabeth Mills is a 78 year old female patient with a past medical history of multiple pneumonias, kidney stones status post ureteroscopy in 2024 presenting today to the pulmonary clinic as a referral from Dayna Dunn PA for further evaluation of bronchiectasis found incidentally on CT abdomen and pelvis.  She presented to [facility] in January 2024 with right flank pain and was found to have nephrolithiasis. As part of the work up she underwent a CT a/p and did show RML bronchiectasis with associated consolidative opacity.   She is overall doing well from a respiratory standpoint.  She does sometimes feel like she has a cold in her chest but no pain.  Intermittently wheezes.  She denies any dyspnea unless it is uphill.  She did have multiple pneumonias in the past.  She does report symptoms of GERD and takes Pepcid for that.  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   PFTs not done.   IgG, IgA and IgM were normal   08/05/2023 - Was diagnosed with CAP pneumonia about a week ago and has been dealing with a lingering cough and wheezing.   Family history -father passed away of emphysema and was a heavy smoker.  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  ROS All symptoms were reviewed and are negative except for the above . Objective:   Vitals:   08/05/23 0905  BP: 110/60  Pulse: 89  Temp: (!) 96.4 F (35.8 C)  SpO2: 94%  Weight: 136 lb 6.4 oz (61.9 kg)  Height: 5\' 3"  (1.6 m)   94% on RA BMI Readings from Last 3 Encounters:   08/05/23 24.16 kg/m  06/30/23 24.62 kg/m  05/25/23 24.73 kg/m   Wt Readings from Last 3 Encounters:  08/05/23 136 lb 6.4 oz (61.9 kg)  06/30/23 139 lb (63 kg)  05/25/23 139 lb 9.6 oz (63.3 kg)    Physical Exam GEN: NAD, Healthy Appearing HEENT: Supple Neck, Reactive Pupils, EOMI  CVS: Normal S1, Normal S2, RRR, No murmurs or ES appreciated  Lungs: Faint expiratory wheezing bilaterally.  Abdomen: Soft, non tender, non distended, + BS  Extremities: Warm and well perfused, No edema  Skin: No suspicious lesions appreciated  Psych: Normal Affect  Labs and imaging were reviewed Ancillary Information   CBC    Component Value Date/Time   WBC 4.5 05/25/2023 1054   RBC 4.73 05/25/2023 1054   HGB 14.8 05/25/2023 1054   HGB 14.2 01/25/2019 1040   HCT 43.0 05/25/2023 1054   HCT 40.6 01/25/2019 1040   PLT 271 05/25/2023 1054   PLT 304 01/25/2019 1040   MCV 90.9 05/25/2023 1054   MCV 90 01/25/2019 1040   MCH 31.3 05/25/2023 1054   MCHC 34.4 05/25/2023 1054   RDW 12.8 05/25/2023 1054   RDW 13.1 01/25/2019 1040   LYMPHSABS 1.4 05/25/2023 1054   LYMPHSABS 1.5 04/25/2016 0813   MONOABS 0.6 05/25/2023 1054   EOSABS 0.3 05/25/2023 1054   EOSABS 0.3 04/25/2016 0813   BASOSABS 0.0 05/25/2023 1054   BASOSABS 0.0 04/25/2016 0813  No data to display           Assessment & Plan:  Elizabeth Mills is a 78 year old female patient with a past medical history of multiple pneumonias, kidney stones status post ureteroscopy in 2024 presenting today to the pulmonary clinic as a referral from Dayna Dunn PA for further evaluation of bronchiectasis found incidentally on CT abdomen and pelvis.  #CAP and Associated Acute bronchitis.  Discussed that this is usually self limting, advised to take an extra dose of advair mid day and continue with albuterol  as needed. Advised on over the counter cough suppressants. I will follow up with her in 4 weeks with a repeat CXR.   #Moderate persistent  asthma  # Right middle lobe bronchiectasis with consolidative opacity Seen incidentally on CT abdomen and pelvis for nephrolithiasis.  It is located in the right middle lobe with differential diagnosis including [place] syndrome (NTM), sequela of prior community-acquired pneumonia, immunoglobulin deficiency.  Will obtain a CT chest without contrast and will discuss the need for bronchoscopy for further evaluation.  If this is NTM, no treatment is required at this point given lack of symptoms..  Discussed that no treatment is required at this time for her NTM.  C/w ICS-LABA and Albuterol  as needed.   #GERD Advised on lifestyle modification.  Start pantoprazole  40 mg p.o. daily.  Return in about 4 weeks (around 09/02/2023).  I spent 20 minutes caring for this patient today, including preparing to see the patient, obtaining a medical history , reviewing a separately obtained history, performing a medically appropriate examination and/or evaluation, counseling and educating the patient/family/caregiver, ordering medications, tests, or procedures, documenting clinical information in the electronic health record, and independently interpreting results (not separately reported/billed) and communicating results to the patient/family/caregiver  Annitta Kindler, MD [facility] 08/05/2023 10:08 AM

## 2023-08-31 ENCOUNTER — Other Ambulatory Visit: Payer: Self-pay

## 2023-08-31 DIAGNOSIS — J479 Bronchiectasis, uncomplicated: Secondary | ICD-10-CM

## 2023-09-01 ENCOUNTER — Ambulatory Visit (INDEPENDENT_AMBULATORY_CARE_PROVIDER_SITE_OTHER): Admitting: Pulmonary Disease

## 2023-09-01 DIAGNOSIS — J479 Bronchiectasis, uncomplicated: Secondary | ICD-10-CM | POA: Diagnosis not present

## 2023-09-01 LAB — PULMONARY FUNCTION TEST
DL/VA % pred: 91 %
DL/VA: 3.83 ml/min/mmHg/L
DLCO unc % pred: 79 %
DLCO unc: 14.41 ml/min/mmHg
FEF 25-75 Pre: 1.85 L/s
FEF2575-%Pred-Pre: 123 %
FEV1-%Pred-Pre: 91 %
FEV1-Pre: 1.77 L
FEV1FVC-%Pred-Pre: 107 %
FEV6-%Pred-Pre: 89 %
FEV6-Pre: 2.21 L
FEV6FVC-%Pred-Pre: 105 %
FVC-%Pred-Pre: 84 %
FVC-Pre: 2.21 L
Pre FEV1/FVC ratio: 80 %
Pre FEV6/FVC Ratio: 100 %
RV % pred: 94 %
RV: 2.16 L
TLC % pred: 88 %
TLC: 4.34 L

## 2023-09-01 NOTE — Progress Notes (Signed)
 Full PFT without post due to pt taking albuterol  1 hour before testing.

## 2023-09-01 NOTE — Patient Instructions (Signed)
 Full PFT completed today without post.

## 2023-09-07 ENCOUNTER — Ambulatory Visit: Admitting: Pulmonary Disease

## 2023-09-09 ENCOUNTER — Ambulatory Visit (INDEPENDENT_AMBULATORY_CARE_PROVIDER_SITE_OTHER): Admitting: Pulmonary Disease

## 2023-09-09 ENCOUNTER — Encounter: Payer: Self-pay | Admitting: Pulmonary Disease

## 2023-09-09 VITALS — BP 110/62 | HR 72 | Temp 96.9°F | Ht 63.0 in | Wt 137.2 lb

## 2023-09-09 DIAGNOSIS — Z7722 Contact with and (suspected) exposure to environmental tobacco smoke (acute) (chronic): Secondary | ICD-10-CM

## 2023-09-09 DIAGNOSIS — K219 Gastro-esophageal reflux disease without esophagitis: Secondary | ICD-10-CM | POA: Diagnosis not present

## 2023-09-09 DIAGNOSIS — J45909 Unspecified asthma, uncomplicated: Secondary | ICD-10-CM

## 2023-09-09 DIAGNOSIS — B370 Candidal stomatitis: Secondary | ICD-10-CM

## 2023-09-09 DIAGNOSIS — J189 Pneumonia, unspecified organism: Secondary | ICD-10-CM

## 2023-09-09 DIAGNOSIS — R051 Acute cough: Secondary | ICD-10-CM

## 2023-09-09 MED ORDER — CLOTRIMAZOLE 10 MG MT TROC: 10.0000 mg | Freq: Three times a day (TID) | OROMUCOSAL | 0 refills | Status: AC

## 2023-09-09 MED ORDER — FLUTICASONE PROPIONATE 50 MCG/ACT NA SUSP: 1.0000 | Freq: Every day | NASAL | 2 refills | Status: AC

## 2023-09-09 NOTE — Progress Notes (Unsigned)
 Synopsis: Referred in by Colene Dauphin, MD   Subjective:   PATIENT ID: Elizabeth Mills GENDER: female DOB: 12/03/1945, MRN: [id number]  Chief Complaint  Patient presents with  . Follow-up    Cough with thick sputum. Throat feels irritated. No SOB or wheezing.     HPI Elizabeth Mills is a 78 year old female patient with a past medical history of multiple pneumonias, kidney stones status post ureteroscopy in 2024 presenting today to the pulmonary clinic as a referral from Dayna Dunn PA for further evaluation of bronchiectasis found incidentally on CT abdomen and pelvis.  She presented to [facility] in January 2024 with right flank pain and was found to have nephrolithiasis. As part of the work up she underwent a CT a/p and did show RML bronchiectasis with associated consolidative opacity.   She is overall doing well from a respiratory standpoint.  She does sometimes feel like she has a cold in her chest but no pain.  Intermittently wheezes.  She denies any dyspnea unless it is uphill.  She did have multiple pneumonias in the past.  She does report symptoms of GERD and takes Pepcid for that.  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   PFTs not done.   IgG, IgA and IgM were normal   08/05/2023 - Was diagnosed with CAP pneumonia about a week ago and has been dealing with a lingering cough and wheezing.   Family history -father passed away of emphysema and was a heavy smoker.  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  OV 09/09/2023 ROS All symptoms were reviewed and are negative except for the above . Objective:   Vitals:   09/09/23 1003  BP: 110/62  Pulse: 72  Temp: (!) 96.9 F (36.1 C)  SpO2: 96%  Weight: 137 lb 3.2 oz (62.2 kg)  Height: 5' 3 (1.6 m)   96% on RA BMI Readings from Last 3 Encounters:  09/09/23 24.30  kg/m  09/01/23 24.13 kg/m  08/05/23 24.16 kg/m   Wt Readings from Last 3 Encounters:  09/09/23 137 lb 3.2 oz (62.2 kg)  09/01/23 136 lb 3.2 oz (61.8 kg)  08/05/23 136 lb 6.4 oz (61.9 kg)    Physical Exam GEN: NAD, Healthy Appearing HEENT: Supple Neck, Reactive Pupils, EOMI  CVS: Normal S1, Normal S2, RRR, No murmurs or ES appreciated  Lungs: Faint expiratory wheezing bilaterally.  Abdomen: Soft, non tender, non distended, + BS  Extremities: Warm and well perfused, No edema  Skin: No suspicious lesions appreciated  Psych: Normal Affect  Labs and imaging were reviewed Ancillary Information   CBC    Component Value Date/Time   WBC 4.5 05/25/2023 1054   RBC 4.73 05/25/2023 1054   HGB 14.8 05/25/2023 1054   HGB 14.2 01/25/2019 1040   HCT 43.0 05/25/2023 1054   HCT 40.6 01/25/2019 1040   PLT 271 05/25/2023 1054   PLT 304 01/25/2019 1040   MCV 90.9 05/25/2023 1054   MCV 90 01/25/2019 1040   MCH 31.3 05/25/2023 1054   MCHC 34.4 05/25/2023 1054   RDW 12.8 05/25/2023 1054   RDW 13.1 01/25/2019 1040   LYMPHSABS 1.4 05/25/2023 1054   LYMPHSABS 1.5 04/25/2016 0813   MONOABS 0.6 05/25/2023 1054   EOSABS 0.3 05/25/2023 1054   EOSABS 0.3 04/25/2016 0813   BASOSABS 0.0 05/25/2023 1054   BASOSABS 0.0 04/25/2016 0813  Latest Ref Rng & Units 09/01/2023    3:49 PM  PFT Results  FVC-Pre L 2.21   FVC-Predicted Pre % 84   Pre FEV1/FVC % % 80   FEV1-Pre L 1.77   FEV1-Predicted Pre % 91   DLCO uncorrected ml/min/mmHg 14.41   DLCO UNC% % 79   DLVA Predicted % 91   TLC L 4.34   TLC % Predicted % 88   RV % Predicted % 94      Assessment & Plan:  Elizabeth Mills is a 78 year old female patient with a past medical history of multiple pneumonias, kidney stones status post ureteroscopy in 2024 presenting today to the pulmonary clinic as a referral from Dayna Dunn PA for further evaluation of bronchiectasis found incidentally on CT abdomen and pelvis.  #CAP and Associated Acute  bronchitis.  Discussed that this is usually self limting, advised to take an extra dose of advair mid day and continue with albuterol  as needed. Advised on over the counter cough suppressants. I will follow up with her in 4 weeks with a repeat CXR.   #Moderate persistent asthma  # Right middle lobe bronchiectasis with consolidative opacity Seen incidentally on CT abdomen and pelvis for nephrolithiasis.  It is located in the right middle lobe with differential diagnosis including Lady Winder[place] syndrome (NTM), sequela of prior community-acquired pneumonia, immunoglobulin deficiency.  Will obtain a CT chest without contrast and will discuss the need for bronchoscopy for further evaluation.  If this is NTM, no treatment is required at this point given lack of symptoms..  Discussed that no treatment is required at this time for her NTM.  C/w ICS-LABA and Albuterol  as needed.   #GERD Advised on lifestyle modification.  Start pantoprazole  40 mg p.o. daily.  No follow-ups on file.  I spent 20 minutes caring for this patient today, including preparing to see the patient, obtaining a medical history , reviewing a separately obtained history, performing a medically appropriate examination and/or evaluation, counseling and educating the patient/family/caregiver, ordering medications, tests, or procedures, documenting clinical information in the electronic health record, and independently interpreting results (not separately reported/billed) and communicating results to the patient/family/caregiver  Annitta Kindler, MD [facility] 09/09/2023 10:21 AM

## 2023-09-22 ENCOUNTER — Other Ambulatory Visit: Payer: Self-pay | Admitting: Internal Medicine

## 2023-09-23 ENCOUNTER — Other Ambulatory Visit (HOSPITAL_COMMUNITY): Payer: Self-pay

## 2023-09-23 ENCOUNTER — Telehealth: Payer: Self-pay

## 2023-09-23 NOTE — Telephone Encounter (Signed)
 Pharmacy Patient Advocate Encounter   Received notification from Pt Calls Messages that prior authorization for Advair Diskus 250-50mcg is required/requested.   Insurance verification completed.   The patient is insured through [facility] .   Per test claim: PA required; PA submitted to above mentioned insurance via Phone Key/confirmation #/EOC [id number] Status is pending   Phone# [phone]

## 2023-09-23 NOTE — Telephone Encounter (Signed)
 Copied from CRM [id number]. Topic: Clinical - Prescription Issue >> Sep 22, 2023  4:19 PM Burnard DEL wrote: Reason for CRM: [place] [facility] called and stated that patient needs PA for fluticasone -salmeterol (ADVAIR) 250-50 MCG/ACT AEPB  PA department#: [phone]

## 2023-10-01 ENCOUNTER — Other Ambulatory Visit: Payer: Self-pay | Admitting: Internal Medicine

## 2023-10-01 NOTE — Telephone Encounter (Unsigned)
 Copied from CRM [id number]. Topic: Clinical - Medication Refill >> Oct 01, 2023  4:53 PM Geneva B wrote: Medication: fluticasone -salmeterol (ADVAIR) 250-50  Has the patient contacted their pharmacy? Yes (Agent: If no, request that the patient contact the pharmacy for the refill. If patient does not wish to contact the pharmacy document the reason why and proceed with request.) (Agent: If yes, when and what did the pharmacy advise?)  This is the patient's preferred pharmacy:  [id number] [place], [place] - [address]  [place] [address] Phone: [phone] Fax: [phone]  [facility] - [place], [place] - [address] [facility] [address] [place] [address] [place] [address] Phone: [phone] Fax: [phone]  [facility] - [place], [place] - [address] [place] [address] Phone: [phone] Fax: [phone]  Is this the correct pharmacy for this prescription? Yes If no, delete pharmacy and type the correct one.   Has the prescription been filled recently? no  Is the patient out of the medication? Yes  Has the patient been seen for an appointment in the last year OR does the patient have an upcoming appointment? Yes  Can we respond through MyChart? No  Agent: Please be advised that Rx refills may take up to 3 business days. We ask that you follow-up with your pharmacy.

## 2023-10-06 NOTE — Telephone Encounter (Signed)
 Copied from CRM [id number]. Topic: Clinical - Medication Prior Auth >> Oct 06, 2023 10:50 AM Chiquita SQUIBB wrote: Reason for CRM: Patient is calling in for an update on the prior. Authorization for the ADVAIR. Please update the patient, patient states she is almost out.

## 2023-10-07 ENCOUNTER — Other Ambulatory Visit (HOSPITAL_COMMUNITY): Payer: Self-pay

## 2023-10-07 NOTE — Telephone Encounter (Signed)
 Pharmacy Patient Advocate Encounter  Received notification from [employer] [facility] [employer] that Prior Authorization for Advair Diskus 250-50mcg  has been DENIED.  Patient must try brand Symbicot or Breo Ellipta.  Per the representative, generic Advair is covered for the patient, the brand is not.

## 2023-10-10 MED ORDER — FLUTICASONE-SALMETEROL 250-50 MCG/ACT IN AEPB: INHALATION_SPRAY | RESPIRATORY_TRACT | 3 refills | Status: DC

## 2023-10-10 NOTE — Telephone Encounter (Signed)
 Rx for generic advair sent to [facility]

## 2023-10-10 NOTE — Addendum Note (Signed)
 Addended by: GEOFM GLADE PARAS on: 10/10/2023 03:42 PM   Modules accepted: Orders

## 2023-10-14 ENCOUNTER — Telehealth: Payer: Self-pay | Admitting: Internal Medicine

## 2023-10-14 NOTE — Telephone Encounter (Signed)
 Copied from CRM [id number]. Topic: Clinical - Medication Prior Auth >> Oct 14, 2023  9:39 AM Gennette ORN wrote: Reason for CRM: Patient is calling in for an update on the prior authorization for the ADVAIR 250MG . Please update the patient, patient states she is almost out. Best call back number [phone]. She also mentioned she will be at a funeral within a hour if you can't reach her leave a voicemail or message in my chart.

## 2023-10-14 NOTE — Telephone Encounter (Signed)
 Message left for patient.  Insurance covered inhaler faxed in on 7/19.  Spoke with pharmacy and they are waiting to get it in stock.  Message left for patient with this info and for her to contact pharmacy to see when it would be in or if they could transfer it from another store.

## 2023-11-14 ENCOUNTER — Other Ambulatory Visit: Payer: Self-pay | Admitting: Pulmonary Disease

## 2023-11-14 DIAGNOSIS — K2100 Gastro-esophageal reflux disease with esophagitis, without bleeding: Secondary | ICD-10-CM

## 2023-11-24 ENCOUNTER — Ambulatory Visit: Payer: Self-pay

## 2023-11-24 NOTE — Progress Notes (Unsigned)
    Subjective:    Patient ID: Elizabeth Mills, female    DOB: April 11, 1945, 78 y.o.   MRN: [id number]      HPI Elizabeth Mills is here for No chief complaint on file.        Medications and allergies reviewed with patient and updated if appropriate.  Current Outpatient Medications on File Prior to Visit  Medication Sig Dispense Refill   albuterol  (VENTOLIN  HFA) 108 (90 Base) MCG/ACT inhaler USE 1 TO 2 INHALATIONS BY MOUTH  EVERY 4 HOURS AS NEEDED FOR  WHEEZING OR SHORTNESS OF BREATH 51 g 3   clotrimazole  (MYCELEX ) 10 MG troche Take 1 tablet (10 mg total) by mouth 3 (three) times daily. 30 tablet 0   estradiol  (ESTRACE ) 0.1 MG/GM vaginal cream PLACE 1 APPLICATORFUL  VAGINALLY WEEKLY 85 g 3   fluticasone  (FLONASE ) 50 MCG/ACT nasal spray PLACE 2 SPRAYS INTO BOTH NOSTRILS DAILY AS NEEDED FOR ALLERGIES OR RHINITIS (ALLERGIES). 16 mL 1   fluticasone  (FLONASE ) 50 MCG/ACT nasal spray Place 1 spray into both nostrils daily. 100 mL 2   fluticasone -salmeterol (ADVAIR) 250-50 MCG/ACT AEPB USE 1 INHALATION BY MOUTH EVERY  12 HOURS 180 each 3   loratadine  (CLARITIN ) 10 MG tablet Take 10 mg by mouth daily.     metoprolol  succinate (TOPROL -XL) 25 MG 24 hr tablet TAKE ONE-HALF TABLET BY  MOUTH DAILY , MAY ALSO TAKE AN EXTRA ONE-HALF TABLET BY MOUTH DAILY AS NEEDED FOR  PALPITATIONS 90 tablet 3   Multiple Vitamin (MULTIVITAMIN) tablet Take 1 tablet by mouth daily.     ondansetron  (ZOFRAN ) 4 MG tablet Take 1 tablet (4 mg total) by mouth every 6 (six) hours. (Patient taking differently: Take 4 mg by mouth every 6 (six) hours as needed.) 15 tablet 0   oxyCODONE -acetaminophen  (PERCOCET/ROXICET) 5-325 MG tablet Take 1 tablet by mouth every 6 (six) hours as needed for severe pain. 15 tablet 0   pantoprazole  (PROTONIX ) 40 MG tablet TAKE 1 TABLET BY MOUTH EVERY DAY 90 tablet 1   rosuvastatin  (CRESTOR ) 40 MG tablet Take 1 tablet (40 mg total) by mouth 3 (three) times a week. 45 tablet 3   SYNTHROID  75 MCG  tablet TAKE 1 TABLET BY MOUTH  DAILY  6 DAYS A WEEK, TAKE 0.5 TABLET PO 1 DAY A WEEK BEFORE BREAKFAST     tamsulosin  (FLOMAX ) 0.4 MG CAPS capsule Take 1 capsule (0.4 mg total) by mouth daily. 30 capsule 0   Vitamin D , Cholecalciferol, 25 MCG (1000 UT) TABS Take 3,000 Units by mouth daily.  gummies     No current facility-administered medications on file prior to visit.    Review of Systems     Objective:  There were no vitals filed for this visit. BP Readings from Last 3 Encounters:  09/09/23 110/62  08/05/23 110/60  05/25/23 116/62   Wt Readings from Last 3 Encounters:  09/09/23 137 lb 3.2 oz (62.2 kg)  09/01/23 136 lb 3.2 oz (61.8 kg)  08/05/23 136 lb 6.4 oz (61.9 kg)   There is no height or weight on file to calculate BMI.    Physical Exam         Assessment & Plan:    See Problem List for Assessment and Plan of chronic medical problems.

## 2023-11-24 NOTE — Telephone Encounter (Signed)
 FYI Only or Action Required?: FYI only for provider.  Patient was last seen in primary care on 05/21/2023 by Corwin Antu, FNP.  Called Nurse Triage reporting Urinary Frequency.  Symptoms began several days ago.  Interventions attempted: Rest, hydration, or home remedies.  Symptoms are: unchanged.  Triage Disposition: See Physician Within 24 Hours  Patient/caregiver understands and will follow disposition?: Yes  Copied from CRM #[id number]. Topic: Clinical - Red Word Triage >> Nov 24, 2023  9:30 AM Mesmerise C wrote: [facility] that prompted transfer to Nurse Triage: patient stated she feels a prolapse like everything is dropping, feeling like she has to go to the bathroom constantly, states very uncomfortable Reason for Disposition  Urinating more frequently than usual (i.e., frequency) OR new-onset of the feeling of an urgent need to urinate (i.e., urgency)  Answer Assessment - Initial Assessment Questions Feels some pain/pressure when standing. Hx of kidney stones. Denies blood in urine. Has been trying to drink cranberry juice, lemonade, water. Feels like its harder to have a BM, unusual for her. States it feels like she has a stent or catheter up there. Bladder feels full, doesn't feel like she's emptying entirely. Scheduled with Dr. Geofm 9/3, patient would like to be seen today if possible. Given UC info, but would like to see PCP  1. SYMPTOM: What's the main symptom you're concerned about? (e.g., frequency, incontinence)     Urinary frequency, feels like everything is dropping  2. ONSET: When did the  Pressure/ Frequency  start?     Saturday  3. PAIN: Is there any pain? If Yes, ask: How bad is it? (Scale: 1-10; mild, moderate, severe)     Pressure, when standing   4. CAUSE: What do you think is causing the symptoms?     Unsure  5. OTHER SYMPTOMS: Do you have any other symptoms? (e.g., blood in urine, fever, flank pain, pain with urination)     No  Protocols  used: Urinary Symptoms-A-AH

## 2023-11-24 NOTE — Telephone Encounter (Signed)
 Spoke with patient today.

## 2023-11-25 ENCOUNTER — Encounter: Payer: Self-pay | Admitting: Internal Medicine

## 2023-11-25 ENCOUNTER — Other Ambulatory Visit (INDEPENDENT_AMBULATORY_CARE_PROVIDER_SITE_OTHER)

## 2023-11-25 ENCOUNTER — Ambulatory Visit: Admitting: Internal Medicine

## 2023-11-25 VITALS — BP 116/68 | HR 75 | Temp 97.7°F | Ht 63.0 in | Wt 140.0 lb

## 2023-11-25 DIAGNOSIS — R300 Dysuria: Secondary | ICD-10-CM | POA: Diagnosis not present

## 2023-11-25 DIAGNOSIS — N3001 Acute cystitis with hematuria: Secondary | ICD-10-CM

## 2023-11-25 DIAGNOSIS — N3000 Acute cystitis without hematuria: Secondary | ICD-10-CM | POA: Insufficient documentation

## 2023-11-25 LAB — POC URINALSYSI DIPSTICK (AUTOMATED)
Bilirubin, UA: NEGATIVE
Glucose, UA: NEGATIVE
Ketones, UA: NEGATIVE
Nitrite, UA: NEGATIVE
Protein, UA: POSITIVE — AB
Spec Grav, UA: 1.025 (ref 1.010–1.025)
Urobilinogen, UA: 0.2 U/dL
pH, UA: 6 (ref 5.0–8.0)

## 2023-11-25 MED ORDER — CEPHALEXIN 500 MG PO CAPS: 500.0000 mg | ORAL_CAPSULE | Freq: Two times a day (BID) | ORAL | 0 refills | Status: DC

## 2023-11-25 NOTE — Assessment & Plan Note (Addendum)
 Acute Urine dip consistent with UTI She does have some symptoms concerning for possible prolapse, but it seems somewhat intermittent and could be related to straining-if this symptom persists after treatment can consider urogynecology evaluation Will send urine for culture Take tablets 500 mg twice daily x 7 days Advised restarting vaginal estrogen for prevention Take tylenol  if needed.   Increase your water intake.  Call if no improvement

## 2023-11-25 NOTE — Patient Instructions (Addendum)
    Medications changes include :   keflex  500 mg twice a day for one week.  Restart the vaginal estrogen cream.      Return if symptoms worsen or fail to improve.

## 2023-11-27 LAB — CULTURE, URINE COMPREHENSIVE

## 2023-11-28 ENCOUNTER — Ambulatory Visit: Payer: Self-pay | Admitting: Internal Medicine

## 2023-12-05 ENCOUNTER — Telehealth: Admitting: Nurse Practitioner

## 2023-12-05 DIAGNOSIS — R399 Unspecified symptoms and signs involving the genitourinary system: Secondary | ICD-10-CM

## 2023-12-05 MED ORDER — CEPHALEXIN 500 MG PO CAPS: 500.0000 mg | ORAL_CAPSULE | Freq: Two times a day (BID) | ORAL | 0 refills | Status: DC

## 2023-12-05 NOTE — Progress Notes (Signed)

## 2023-12-05 NOTE — Progress Notes (Signed)
 I have spent 5 minutes in review of e-visit questionnaire, review and updating patient chart, medical decision making and response to patient.   Claiborne Rigg, NP

## 2023-12-10 ENCOUNTER — Other Ambulatory Visit: Payer: Self-pay | Admitting: Internal Medicine

## 2023-12-12 ENCOUNTER — Other Ambulatory Visit: Payer: Self-pay | Admitting: Internal Medicine

## 2023-12-25 ENCOUNTER — Encounter: Payer: Self-pay | Admitting: Internal Medicine

## 2023-12-25 DIAGNOSIS — R300 Dysuria: Secondary | ICD-10-CM

## 2023-12-29 ENCOUNTER — Other Ambulatory Visit (INDEPENDENT_AMBULATORY_CARE_PROVIDER_SITE_OTHER)

## 2023-12-29 DIAGNOSIS — R300 Dysuria: Secondary | ICD-10-CM | POA: Diagnosis not present

## 2023-12-29 LAB — URINALYSIS, ROUTINE W REFLEX MICROSCOPIC
Bilirubin Urine: NEGATIVE
Hgb urine dipstick: NEGATIVE
Ketones, ur: NEGATIVE
Nitrite: NEGATIVE
RBC / HPF: NONE SEEN (ref 0–?)
Specific Gravity, Urine: 1.01 (ref 1.000–1.030)
Total Protein, Urine: NEGATIVE
Urine Glucose: NEGATIVE
Urobilinogen, UA: 0.2 (ref 0.0–1.0)
pH: 8.5 — AB (ref 5.0–8.0)

## 2023-12-30 LAB — URINE CULTURE

## 2023-12-31 ENCOUNTER — Ambulatory Visit: Payer: Self-pay | Admitting: Internal Medicine

## 2023-12-31 DIAGNOSIS — R350 Frequency of micturition: Secondary | ICD-10-CM

## 2024-01-01 ENCOUNTER — Ambulatory Visit: Admitting: Pulmonary Disease

## 2024-01-01 ENCOUNTER — Encounter: Payer: Self-pay | Admitting: Pulmonary Disease

## 2024-01-01 VITALS — BP 110/60 | HR 72 | Temp 97.6°F | Ht 63.0 in | Wt 142.6 lb

## 2024-01-01 DIAGNOSIS — J479 Bronchiectasis, uncomplicated: Secondary | ICD-10-CM | POA: Diagnosis not present

## 2024-01-01 NOTE — Progress Notes (Signed)
 Synopsis: Referred in by Geofm Glade PARAS, MD   Subjective:   PATIENT ID: Elizabeth Mills GENDER: female DOB: 1945/09/09, MRN: [id number]  Chief Complaint  Patient presents with   Medical Management of Chronic Issues    No SOB. Occasional wheezing. Cough in the mornings.  Using Advair BID and it helps. And Albuterol  2-3 times daily    HPI Elizabeth Mills is a 78 year old female patient with a past medical history of multiple pneumonias, kidney stones status post ureteroscopy in 2024 presenting today to the pulmonary clinic as a referral from Dayna Dunn PA for further evaluation of bronchiectasis found incidentally on CT abdomen and pelvis.  She presented to [facility] in January 2024 with right flank pain and was found to have nephrolithiasis. As part of the work up she underwent a CT a/p and did show RML bronchiectasis with associated consolidative opacity.   She is overall doing well from a respiratory standpoint.  She does sometimes feel like she has a cold in her chest but no pain.  Intermittently wheezes.  She denies any dyspnea unless it is uphill.  She did have multiple pneumonias in the past.  She does report symptoms of GERD and takes Pepcid for that.  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   PFTs not done.   IgG, IgA and IgM were normal   08/05/2023 - Was diagnosed with CAP pneumonia about a week ago and has been dealing with a lingering cough and wheezing.   Family history -father passed away of emphysema and was a heavy smoker.  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  OV 09/09/2023 - Elizabeth Mills is here to follow up on her PFT results. These were normal. She feels overall well but does have some sore throat specifically after starting her inhaler. No apparent oral thrush but she possibly has esophageal thrush and we will  trial clotrimazole  troches. We also discussed trialing flonase  for her post nasal drip which she is agreeable with.   OV 01/01/2024 - Elizabeth Mills is doing well overall. She denies any shortness of breath or chest tightness. She is still having intermittent cough with sputum production in the mornings. I will order a sputum culture and AFB smear and culture given her extensive RML bronchiectasis.   ROS All symptoms were reviewed and are negative except for the above . Objective:   Vitals:   01/01/24 0953  BP: 110/60  Pulse: 72  Temp: 97.6 F (36.4 C)  SpO2: 97%  Weight: 142 lb 9.6 oz (64.7 kg)  Height: 5' 3 (1.6 m)   97% on RA BMI Readings from Last 3 Encounters:  01/01/24 25.26 kg/m  11/25/23 24.80 kg/m  09/09/23 24.30 kg/m   Wt Readings from Last 3 Encounters:  01/01/24 142 lb 9.6 oz (64.7 kg)  11/25/23 140 lb (63.5 kg)  09/09/23 137 lb 3.2 oz (62.2 kg)    Physical Exam GEN: NAD, Healthy Appearing HEENT: Supple Neck, Reactive Pupils, EOMI  CVS: Normal S1, Normal S2, RRR, No murmurs or ES appreciated  Lungs: Faint expiratory wheezing bilaterally.  Abdomen: Soft, non tender, non distended, + BS  Extremities: Warm and well perfused, No edema  Skin: No suspicious lesions appreciated  Psych: Normal Affect  Labs and imaging were reviewed Ancillary Information   CBC    Component Value Date/Time   WBC 4.5 05/25/2023 1054   RBC 4.73  05/25/2023 1054   HGB 14.8 05/25/2023 1054   HGB 14.2 01/25/2019 1040   HCT 43.0 05/25/2023 1054   HCT 40.6 01/25/2019 1040   PLT 271 05/25/2023 1054   PLT 304 01/25/2019 1040   MCV 90.9 05/25/2023 1054   MCV 90 01/25/2019 1040   MCH 31.3 05/25/2023 1054   MCHC 34.4 05/25/2023 1054   RDW 12.8 05/25/2023 1054   RDW 13.1 01/25/2019 1040   LYMPHSABS 1.4 05/25/2023 1054   LYMPHSABS 1.5 04/25/2016 0813   MONOABS 0.6 05/25/2023 1054   EOSABS 0.3 05/25/2023 1054   EOSABS 0.3 04/25/2016 0813   BASOSABS 0.0 05/25/2023 1054   BASOSABS 0.0  04/25/2016 0813      Latest Ref Rng & Units 09/01/2023    3:49 PM  PFT Results  FVC-Pre L 2.21   FVC-Predicted Pre % 84   Pre FEV1/FVC % % 80   FEV1-Pre L 1.77   FEV1-Predicted Pre % 91   DLCO uncorrected ml/min/mmHg 14.41   DLCO UNC% % 79   DLVA Predicted % 91   TLC L 4.34   TLC % Predicted % 88   RV % Predicted % 94      Assessment & Plan:  Elizabeth Mills is a 78 year old female patient with a past medical history of multiple pneumonias, kidney stones status post ureteroscopy in 2024 presenting today to the pulmonary clinic as a referral from Dayna Dunn PA for further evaluation of bronchiectasis found incidentally on CT abdomen and pelvis.   #Moderate persistent asthma  # Right middle lobe bronchiectasis with consolidative opacity Seen incidentally on CT abdomen and pelvis for nephrolithiasis.  It is located in the right middle lobe with differential diagnosis including Lady [place] syndrome (NTM), sequela of prior community-acquired pneumonia, immunoglobulin deficiency.  Will obtain a CT chest without contrast and will discuss the need for bronchoscopy for further evaluation.  If this is NTM, no treatment is required at this point given lack of symptoms..  Discussed that no treatment is required at this time for her NTM.  C/w ICS-LABA and Albuterol  as needed.  Sputum culture and gram stain.   #GERD Advised on lifestyle modification.  Start pantoprazole  40 mg p.o. daily.  RTC 6 monhts.   I personally spent a total of 30 minutes in the care of the patient today including preparing to see the patient, getting/reviewing separately obtained history, performing a medically appropriate exam/evaluation, counseling and educating, placing orders, documenting clinical information in the EHR, independently interpreting results, and communicating results.   Darrin Barn, MD [facility] 01/01/2024 11:13 AM

## 2024-01-02 ENCOUNTER — Encounter: Payer: Self-pay | Admitting: Internal Medicine

## 2024-01-02 DIAGNOSIS — J479 Bronchiectasis, uncomplicated: Secondary | ICD-10-CM | POA: Insufficient documentation

## 2024-01-13 ENCOUNTER — Other Ambulatory Visit
Admission: RE | Admit: 2024-01-13 | Discharge: 2024-01-13 | Disposition: A | Discharge status: Home / Self Care | Source: Ambulatory Visit | Attending: Pulmonary Disease | Admitting: Pulmonary Disease

## 2024-01-13 ENCOUNTER — Telehealth: Payer: Self-pay

## 2024-01-13 DIAGNOSIS — J479 Bronchiectasis, uncomplicated: Secondary | ICD-10-CM | POA: Diagnosis present

## 2024-01-13 LAB — EXPECTORATED SPUTUM ASSESSMENT W GRAM STAIN, RFLX TO RESP C

## 2024-01-13 NOTE — Telephone Encounter (Signed)
 Copied from CRM #[id number]. Topic: Clinical - Request for Lab/Test Order >> Jan 13, 2024 12:11 PM Celestine FALCON wrote: Reason for CRM: Lorn from [facility] stated the sample received for Expectorated Sputum Assessment w Gram Stain, Rflx to Resp Cult was not acceptable due to having too much spit in the sample. She is placing a request for a new order. Pt sees Dr. Malka in [place].

## 2024-01-13 NOTE — Telephone Encounter (Signed)
 I have notified the patient. She said she does not think she will be able to get up enough sputum to do the test.   Dr. Malka - okay to cancel the test?

## 2024-01-14 NOTE — Telephone Encounter (Signed)
 Orders have been canceled. The patient is aware.  Nothing further needed.

## 2024-02-01 ENCOUNTER — Ambulatory Visit
Admission: EM | Admit: 2024-02-01 | Discharge: 2024-02-01 | Disposition: A | Discharge status: Home / Self Care | Attending: Emergency Medicine | Admitting: Emergency Medicine

## 2024-02-01 DIAGNOSIS — J069 Acute upper respiratory infection, unspecified: Secondary | ICD-10-CM | POA: Diagnosis not present

## 2024-02-01 DIAGNOSIS — J029 Acute pharyngitis, unspecified: Secondary | ICD-10-CM

## 2024-02-01 LAB — POCT RAPID STREP A (OFFICE): Rapid Strep A Screen: NEGATIVE

## 2024-02-01 MED ORDER — AMOXICILLIN-POT CLAVULANATE 875-125 MG PO TABS: 1.0000 | ORAL_TABLET | Freq: Two times a day (BID) | ORAL | 0 refills | Status: AC

## 2024-02-01 NOTE — Discharge Instructions (Addendum)
Follow up with your primary care provider tomorrow.  Go to the emergency department if you have worsening symptoms.    Take the Augmentin as directed.

## 2024-02-01 NOTE — ED Provider Notes (Signed)
 [facility]    CSN: [id number] Arrival date & time: 02/01/24  1029      History   Chief Complaint Chief Complaint  Patient presents with   Sore Throat    HPI Elizabeth Eshbach is a 78 y.o. female.  Patient presents with 4-5 day history of sore throat and hoarse voice.  She has developed postnasal drip, mild occasional cough, and congestion.  Temp 99.9 this morning.  No OTC medications taken today.  She took Mucinex yesterday.  She has been gargling with salt water.  No chest pain, wheezing, or shortness of breath.  Patient's medical history includes bronchiectasis and asthma.  She is followed by pulmonology and was last seen on 01/01/2024.  The history is provided by the patient and medical records.    Past Medical History:  Diagnosis Date   Allergic rhinitis, cause unspecified    Anemia    Arthritis    Asthma    Back pain    Bilateral cataracts    Colitis    Diverticulitis    Generalized headaches    GERD (gastroesophageal reflux disease)    History of kidney stones    Hypercholesterolemia    Hypothyroidism    LVH (left ventricular hypertrophy)    Mild tricuspid regurgitation    Pneumonia    PSVT (paroxysmal supraventricular tachycardia)    Statin intolerance     Patient Active Problem List   Diagnosis Date Noted   Bronchiectasis (HCC), RML 01/02/2024   Acute cystitis 11/25/2023   Asthmatic bronchitis 04/21/2023   Dupuytren contracture of left hand 01/01/2023   Community acquired pneumonia 01/21/2022   Hair loss 05/08/2021   Vaginal atrophy 05/06/2021   PSVT (paroxysmal supraventricular tachycardia) 02/29/2020   Occipital neuralgia of right side 04/27/2019   Hyperglycemia 10/28/2017   Claudication 01/15/2017   Nephrolithiasis 09/22/2016   Osteopenia 06/27/2015   Amblyopia of right eye 03/07/2014   Nuclear sclerosis of both eyes 03/07/2014   Hip bursitis 01/25/2014   Alopecia 07/28/2012   Nuclear cataract 02/04/2012   Asthma    Allergic  rhinitis    Hypercholesterolemia    Back pain    Colitis    Hypothyroidism     Past Surgical History:  Procedure Laterality Date   ABDOMINAL HYSTERECTOMY  1983   APPENDECTOMY  1955   BACK SURGERY     BREAST BIOPSY  1982   BREAST EXCISIONAL BIOPSY Bilateral    CATARACT EXTRACTION W/ INTRAOCULAR LENS IMPLANT Bilateral    COLONOSCOPY     CYSTOSCOPY/URETEROSCOPY/HOLMIUM LASER/STENT PLACEMENT Left 04/09/2022   Procedure: LEFT URETEROSCOPY/HOLMIUM LASER/STENT PLACEMENT;  Surgeon: Lovie Arlyss CROME, MD;  Location: [facility];  Service: Urology;  Laterality: Left;  1 HR FOR CASE   EYE SURGERY  11/14/2009, 1990   blocked tear duct repair   INNER EAR SURGERY     TONSILLECTOMY  1953    OB History   No obstetric history on file.      Home Medications    Prior to Admission medications   Medication Sig Start Date End Date Taking? Authorizing Provider  amoxicillin -clavulanate (AUGMENTIN ) 875-125 MG tablet Take 1 tablet by mouth every 12 (twelve) hours. 02/01/24  Yes Corlis Burnard DEL, NP  albuterol  (VENTOLIN  HFA) 108 (90 Base) MCG/ACT inhaler USE 1 TO 2 INHALATIONS BY MOUTH  EVERY 4 HOURS AS NEEDED FOR  WHEEZING OR SHORTNESS OF BREATH 09/22/23   Geofm Glade PARAS, MD  clotrimazole  (MYCELEX ) 10 MG troche Take 1 tablet (10 mg total) by mouth  3 (three) times daily. 09/09/23   Assaker, Darrin, MD  estradiol  (ESTRACE ) 0.1 MG/GM vaginal cream PLACE 1 APPLICATORFUL  VAGINALLY WEEKLY 01/16/21   Geofm Glade PARAS, MD  fluticasone  (FLONASE ) 50 MCG/ACT nasal spray Place 1 spray into both nostrils daily. Patient taking differently: Place 1 spray into both nostrils as needed. 09/09/23 09/08/24  Assaker, Darrin, MD  fluticasone -salmeterol (ADVAIR) 250-50 MCG/ACT AEPB USE 1 INHALATION BY MOUTH EVERY  12 HOURS 12/14/23   Burns, Glade PARAS, MD  loratadine  (CLARITIN ) 10 MG tablet Take 10 mg by mouth daily.    [provider]  metoprolol  succinate (TOPROL -XL) 25 MG 24 hr tablet TAKE ONE-HALF TABLET BY  MOUTH DAILY  , MAY ALSO TAKE AN EXTRA ONE-HALF TABLET BY MOUTH DAILY AS NEEDED FOR  PALPITATIONS 04/30/23   Chandrasekhar, Mahesh A, MD  ondansetron  (ZOFRAN ) 4 MG tablet Take 1 tablet (4 mg total) by mouth every 6 (six) hours. Patient taking differently: Take 4 mg by mouth every 6 (six) hours as needed. 04/05/22   Prosperi, Christian H, PA-C  oxyCODONE -acetaminophen  (PERCOCET/ROXICET) 5-325 MG tablet Take 1 tablet by mouth every 6 (six) hours as needed for severe pain. 04/05/22   Prosperi, Christian H, PA-C  pantoprazole  (PROTONIX ) 40 MG tablet TAKE 1 TABLET BY MOUTH EVERY DAY 11/16/23   Assaker, Darrin, MD  rosuvastatin  (CRESTOR ) 40 MG tablet Take 1 tablet (40 mg total) by mouth 3 (three) times a week. 03/27/23   Dunn, Dayna N, PA-C  SYNTHROID  75 MCG tablet TAKE 1 TABLET BY MOUTH DAILY  BEFORE BREAKFAST 12/10/23   Geofm Glade PARAS, MD  tamsulosin  (FLOMAX ) 0.4 MG CAPS capsule Take 1 capsule (0.4 mg total) by mouth daily. 04/05/22   Prosperi, Christian H, PA-C  Vitamin D , Cholecalciferol, 25 MCG (1000 UT) TABS Take 3,000 Units by mouth daily.  gummies    [provider]    Family History Family History  Problem Relation Age of Onset   Cancer Mother        Kidney and Ovarian Cancer   Arthritis Mother    Hyperlipidemia Mother    Arthritis Father    Hyperlipidemia Father    Breast cancer Neg Hx     Social History Social History   Tobacco Use   Smoking status: Never    Passive exposure: Past   Smokeless tobacco: Never   Tobacco comments:    married, lives with spouse - retired  [employer]vertising [employer]   Vaping status: Never Used  Substance Use Topics   Alcohol use: No   Drug use: No     Allergies   Crestor  [rosuvastatin  calcium ], Lipitor [atorvastatin calcium ], Phenergan [promethazine hcl], Pravachol, Promethazine, Promethazine hcl, Sulfa antibiotics, Sulfamethoxazole, Zetia [ezetimibe], Zocor [simvastatin - high dose], and Pseudoephedrine   Review of Systems Review of Systems  Constitutional:   Positive for fever. Negative for chills.  HENT:  Positive for congestion, sore throat and voice change. Negative for ear pain.   Respiratory:  Positive for cough. Negative for shortness of breath and wheezing.   Cardiovascular:  Negative for chest pain and palpitations.     Physical Exam Triage Vital Signs ED Triage Vitals  Encounter Vitals Group     BP 02/01/24 1048 116/73     Girls Systolic BP Percentile --      Girls Diastolic BP Percentile --      Boys Systolic BP Percentile --      Boys Diastolic BP Percentile --      Pulse Rate 02/01/24 1048 80  Resp 02/01/24 1048 18     Temp 02/01/24 1048 98.3 F (36.8 C)     Temp src --      SpO2 02/01/24 1048 97 %     Weight --      Height --      Head Circumference --      Peak Flow --      Pain Score 02/01/24 1051 6     Pain Loc --      Pain Education --      Exclude from Growth Chart --    No data found.  Updated Vital Signs BP 116/73   Pulse 80   Temp 98.3 F (36.8 C)   Resp 18   SpO2 97%   Visual Acuity Right Eye Distance:   Left Eye Distance:   Bilateral Distance:    Right Eye Near:   Left Eye Near:    Bilateral Near:     Physical Exam Constitutional:      General: She is not in acute distress. HENT:     Right Ear: Tympanic membrane normal.     Left Ear: Tympanic membrane normal.     Nose: Rhinorrhea present.     Mouth/Throat:     Mouth: Mucous membranes are moist.     Pharynx: Oropharynx is clear.     Comments: PND Cardiovascular:     Rate and Rhythm: Normal rate and regular rhythm.     Heart sounds: Normal heart sounds.  Pulmonary:     Effort: Pulmonary effort is normal. No respiratory distress.     Breath sounds: Normal breath sounds.  Neurological:     Mental Status: She is alert.      UC Treatments / Results  Labs (all labs ordered are listed, but only abnormal results are displayed) Labs Reviewed  POCT RAPID STREP A (OFFICE) - Normal    EKG   Radiology No results  found.  Procedures Procedures (including critical care time)  Medications Ordered in UC Medications - No data to display  Initial Impression / Assessment and Plan / UC Course  I have reviewed the triage vital signs and the nursing notes.  Pertinent labs & imaging results that were available during my care of the patient were reviewed by me and considered in my medical decision making (see chart for details).    Acute upper respiratory infection, acute pharyngitis.  History of bronchiectasis and asthma.  Lungs are clear at this time and O2 sat is 97% on room air.  Treating today with Augmentin  as patient reports this has worked well for her in the past.  Instructed her to follow-up with her PCP tomorrow.  ED precautions given.  She agrees to plan of care.  Final Clinical Impressions(s) / UC Diagnoses   Final diagnoses:  Acute upper respiratory infection  Acute pharyngitis, unspecified etiology     Discharge Instructions      Follow up with your primary care provider tomorrow.  Go to the emergency department if you have worsening symptoms.    Take the Augmentin  as directed.      ED Prescriptions     Medication Sig Dispense Auth. Provider   amoxicillin -clavulanate (AUGMENTIN ) 875-125 MG tablet Take 1 tablet by mouth every 12 (twelve) hours. 14 tablet Corlis Burnard DEL, NP      PDMP not reviewed this encounter.   Corlis Burnard DEL, NP 02/01/24 1122

## 2024-02-01 NOTE — ED Triage Notes (Signed)
 Patient to Urgent Care with complaints of hoarseness/ sore throat.  Symptoms started Thursday. Fever yesterday.   Using mucinex/ ibuprofen/ gargles.

## 2024-02-07 ENCOUNTER — Encounter: Payer: Self-pay | Admitting: Internal Medicine

## 2024-02-07 NOTE — Progress Notes (Unsigned)
 Subjective:    Patient ID: Elizabeth Mills, female    DOB: [birth year], 78 y.o.   MRN: [id number]     HPI Elizabeth Mills is here for follow up from urgent care   11/10 - UC for sore throat.  Symptoms started 4-5 days prior - sore throat, hoarseness, PND, mild cough and congestion.  Temp was 99.9 at home.  No chest pain, wheeze, sob.   Rapid strep negative.  O2 97% on RA.  Given history of asthma and bronchiectasis was prescribed Augmentin .   Discussed the use of AI scribe software for clinical note transcription with the patient, who gave verbal consent to proceed.  History of Present Illness Elizabeth Mills is a 78 year old female who presents with persistent upper respiratory symptoms following a recent episode of vertigo and sore throat.  Her symptoms began with severe vertigo lasting two to three days, which has mostly resolved. This was followed by a sore throat, prompting her to visit urgent care about a week ago. A strep test was negative, and the sore throat has since resolved.  She continues to experience thick mucus drainage, which is no longer green, and occasional coughing. She states, 'it's just been from here up.' No sinus pain or pressure, but she has experienced some hoarseness, especially when talking a lot. She notes a slight wheeze but no significant shortness of breath.  She completed a course of Augmentin , with one pill remaining, and has taken Mucinex once. She is currently using Zyrtec and Flonase  for allergy management and reports using her inhalers.  No fever, headaches, nausea, stomach symptoms, body aches, or significant ear pain. She reports sleeping well, with only minor disturbance when first lying down. She mentions not having much appetite but otherwise feels okay.     Medications and allergies reviewed with patient and updated if appropriate.  Current Outpatient Medications on File Prior to Visit  Medication Sig Dispense Refill    albuterol  (VENTOLIN  HFA) 108 (90 Base) MCG/ACT inhaler USE 1 TO 2 INHALATIONS BY MOUTH  EVERY 4 HOURS AS NEEDED FOR  WHEEZING OR SHORTNESS OF BREATH 51 g 3   amoxicillin -clavulanate (AUGMENTIN ) 875-125 MG tablet Take 1 tablet by mouth every 12 (twelve) hours. 14 tablet 0   clotrimazole  (MYCELEX ) 10 MG troche Take 1 tablet (10 mg total) by mouth 3 (three) times daily. 30 tablet 0   estradiol  (ESTRACE ) 0.1 MG/GM vaginal cream PLACE 1 APPLICATORFUL  VAGINALLY WEEKLY 85 g 3   fluticasone  (FLONASE ) 50 MCG/ACT nasal spray Place 1 spray into both nostrils daily. (Patient taking differently: Place 1 spray into both nostrils as needed.) 100 mL 2   fluticasone -salmeterol (ADVAIR) 250-50 MCG/ACT AEPB USE 1 INHALATION BY MOUTH EVERY  12 HOURS 180 each 3   loratadine  (CLARITIN ) 10 MG tablet Take 10 mg by mouth daily.     metoprolol  succinate (TOPROL -XL) 25 MG 24 hr tablet TAKE ONE-HALF TABLET BY  MOUTH DAILY , MAY ALSO TAKE AN EXTRA ONE-HALF TABLET BY MOUTH DAILY AS NEEDED FOR  PALPITATIONS 90 tablet 3   ondansetron  (ZOFRAN ) 4 MG tablet Take 1 tablet (4 mg total) by mouth every 6 (six) hours. (Patient taking differently: Take 4 mg by mouth every 6 (six) hours as needed.) 15 tablet 0   oxyCODONE -acetaminophen  (PERCOCET/ROXICET) 5-325 MG tablet Take 1 tablet by mouth every 6 (six) hours as needed for severe pain. 15 tablet 0   pantoprazole  (PROTONIX ) 40 MG tablet TAKE 1 TABLET BY MOUTH  EVERY DAY 90 tablet 1   rosuvastatin  (CRESTOR ) 40 MG tablet Take 1 tablet (40 mg total) by mouth 3 (three) times a week. 45 tablet 3   SYNTHROID  75 MCG tablet TAKE 1 TABLET BY MOUTH DAILY  BEFORE BREAKFAST 90 tablet 3   tamsulosin  (FLOMAX ) 0.4 MG CAPS capsule Take 1 capsule (0.4 mg total) by mouth daily. 30 capsule 0   Vitamin D , Cholecalciferol, 25 MCG (1000 UT) TABS Take 3,000 Units by mouth daily.  gummies     No current facility-administered medications on file prior to visit.     Review of Systems  Constitutional:  Negative  for appetite change and fever.  HENT:  Positive for congestion (improved), postnasal drip and voice change. Negative for ear pain, sinus pressure, sinus pain and sore throat (resolved).   Respiratory:  Positive for cough (once in a while from PND) and wheezing (a little). Negative for shortness of breath.   Gastrointestinal:  Negative for nausea.  Musculoskeletal:  Negative for myalgias.  Neurological:  Positive for dizziness (better). Negative for headaches.       Objective:   Vitals:   02/08/24 1027  BP: 116/72  Pulse: 74  Temp: 98.3 F (36.8 C)  SpO2: 96%   BP Readings from Last 3 Encounters:  02/08/24 116/72  02/01/24 116/73  01/01/24 110/60   Wt Readings from Last 3 Encounters:  02/08/24 142 lb (64.4 kg)  01/01/24 142 lb 9.6 oz (64.7 kg)  11/25/23 140 lb (63.5 kg)   Body mass index is 25.15 kg/m.    Physical Exam Constitutional:      General: She is not in acute distress.    Appearance: Normal appearance. She is not ill-appearing.  HENT:     Head: Normocephalic and atraumatic.     Right Ear: Tympanic membrane, ear canal and external ear normal.     Left Ear: Tympanic membrane, ear canal and external ear normal.     Nose: Congestion present.     Mouth/Throat:     Mouth: Mucous membranes are moist.     Pharynx: No oropharyngeal exudate or posterior oropharyngeal erythema.  Eyes:     Conjunctiva/sclera: Conjunctivae normal.  Cardiovascular:     Rate and Rhythm: Normal rate and regular rhythm.  Pulmonary:     Effort: Pulmonary effort is normal. No respiratory distress.     Breath sounds: Normal breath sounds. No wheezing or rales.  Musculoskeletal:     Cervical back: Neck supple. No tenderness.  Lymphadenopathy:     Cervical: No cervical adenopathy.  Skin:    General: Skin is warm and dry.  Neurological:     Mental Status: She is alert.        Lab Results  Component Value Date   WBC 4.5 05/25/2023   HGB 14.8 05/25/2023   HCT 43.0 05/25/2023   PLT  271 05/25/2023   GLUCOSE 99 01/01/2023   CHOL 241 (H) 01/01/2023   TRIG 103.0 01/01/2023   HDL 84.30 01/01/2023   LDLDIRECT 94.2 07/28/2012   LDLCALC 136 (H) 01/01/2023   ALT 18 01/01/2023   AST 20 01/01/2023   NA 139 01/01/2023   K 4.1 01/01/2023   CL 102 01/01/2023   CREATININE 0.68 01/01/2023   BUN 13 01/01/2023   CO2 31 01/01/2023   TSH 1.00 04/21/2023   HGBA1C 5.5 01/01/2023     Assessment & Plan:    See Problem List for Assessment and Plan of chronic medical problems.   Assessment and Plan  Assessment & Plan Acute upper respiratory infection with postnasal drainage-probable sinus infection.  History of asthma Augmentin  course nearly completed. No wheezing present.  - Complete Augmentin  course today-no need for additional antibiotics - Continue Zyrtec and Flonase  for allergy management. - Consider Mucinex to thin mucus if needed. - Ensure adequate hydration. - Monitor symptoms for gradual improvement.  Asthma Chronic, without exacerbation - Continue Advair 250-50 twice daily and albuterol  inhaler as needed

## 2024-02-07 NOTE — Patient Instructions (Addendum)
       Medications changes include :   None        Return if symptoms worsen or fail to improve.

## 2024-02-08 ENCOUNTER — Ambulatory Visit: Admitting: Internal Medicine

## 2024-02-08 VITALS — BP 116/72 | HR 74 | Temp 98.3°F | Ht 63.0 in | Wt 142.0 lb

## 2024-02-08 DIAGNOSIS — J4520 Mild intermittent asthma, uncomplicated: Secondary | ICD-10-CM | POA: Diagnosis not present

## 2024-02-08 DIAGNOSIS — J019 Acute sinusitis, unspecified: Secondary | ICD-10-CM | POA: Diagnosis not present

## 2024-02-15 ENCOUNTER — Other Ambulatory Visit: Payer: Self-pay | Admitting: Physician Assistant

## 2024-03-06 ENCOUNTER — Other Ambulatory Visit: Payer: Self-pay | Admitting: Internal Medicine

## 2024-03-23 IMAGING — MG MM DIGITAL SCREENING BILAT W/ TOMO AND CAD
8 series · 8 of 24 positions shown · non-contrast
Comparison: Previous exam(s).

CLINICAL DATA: Screening.

EXAM:
DIGITAL SCREENING BILATERAL MAMMOGRAM WITH TOMOSYNTHESIS AND CAD
TECHNIQUE: Bilateral screening digital craniocaudal and mediolateral oblique
mammograms were obtained. Bilateral screening digital breast
tomosynthesis was performed. The images were evaluated with
computer-aided detection.

[R MLO synth-2D]
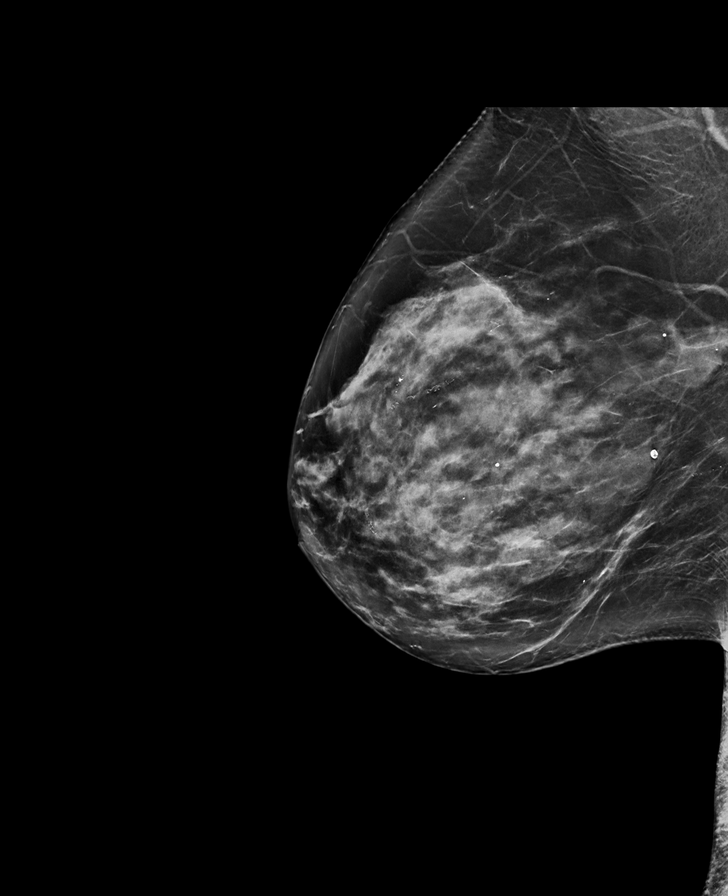

[L MLO synth-2D]
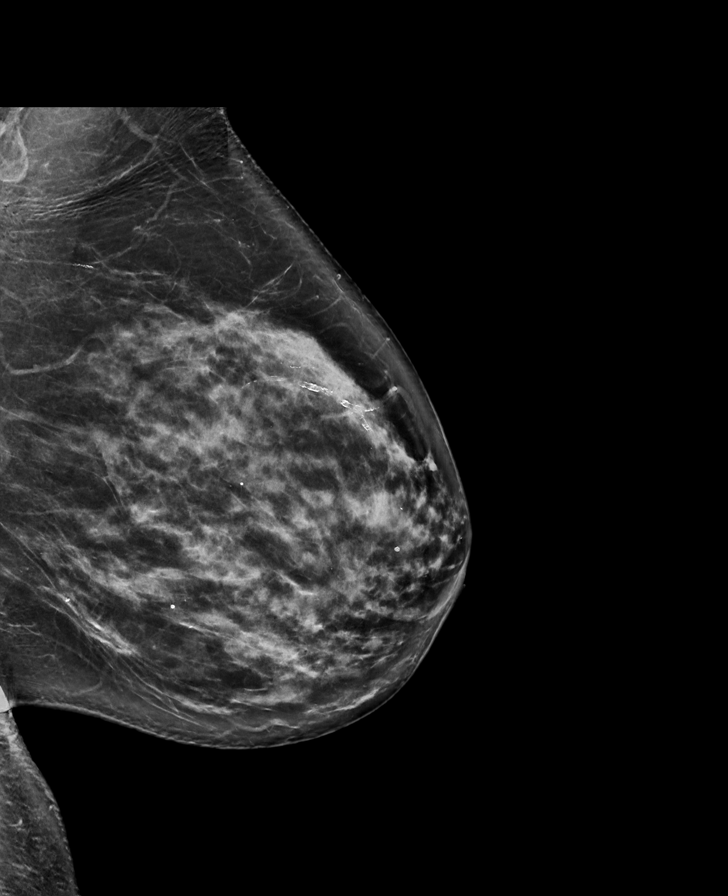

[L CC synth-2D]
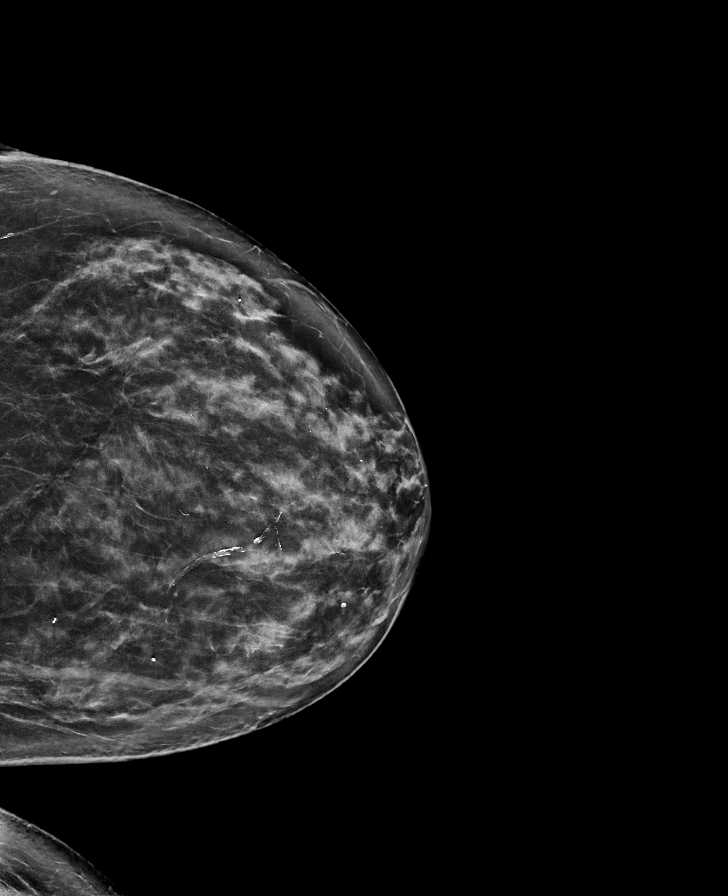

[R CC synth-2D]
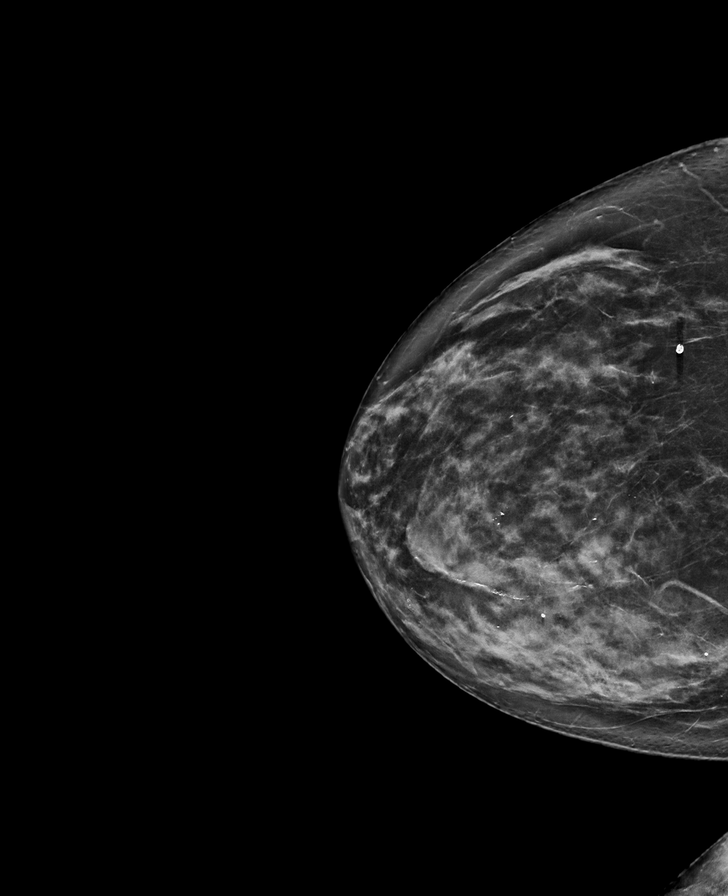

[R MLO tomo · tomo slice 45/89.0]
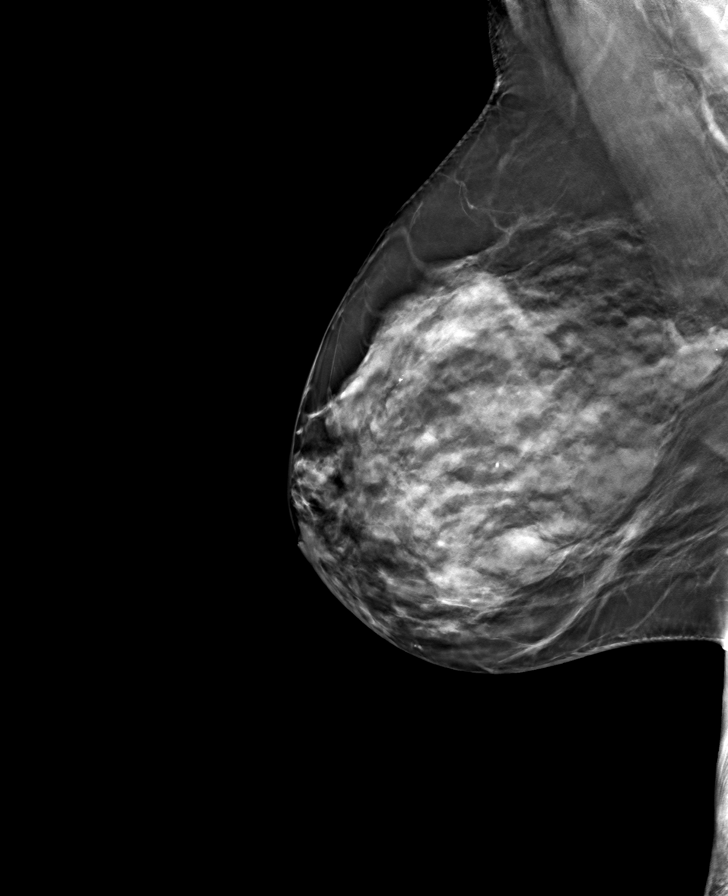

[L MLO tomo · tomo slice 45/90.0]
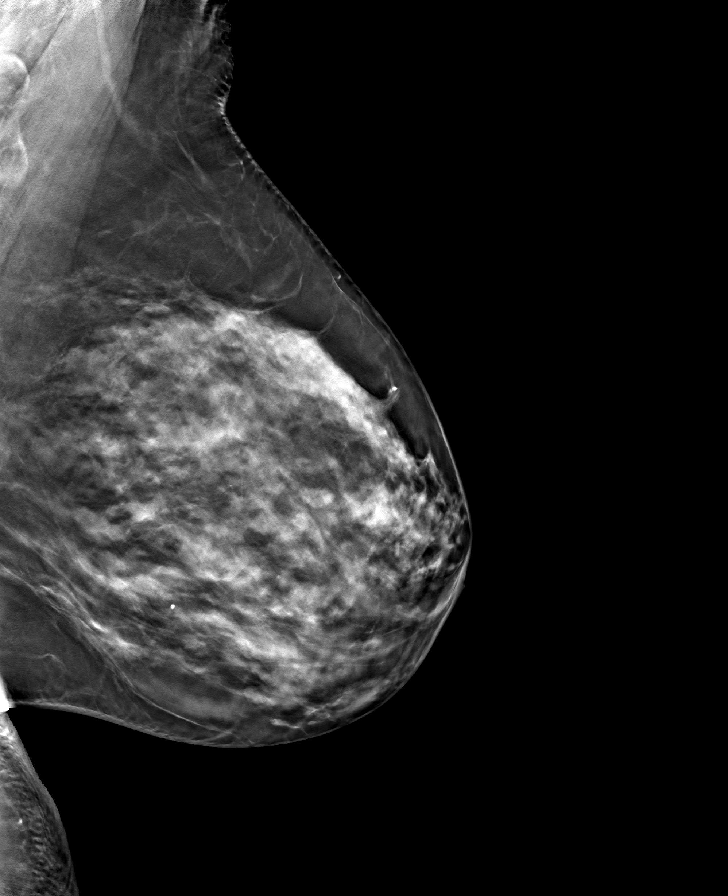

[L CC tomo · tomo slice 43/85.0]
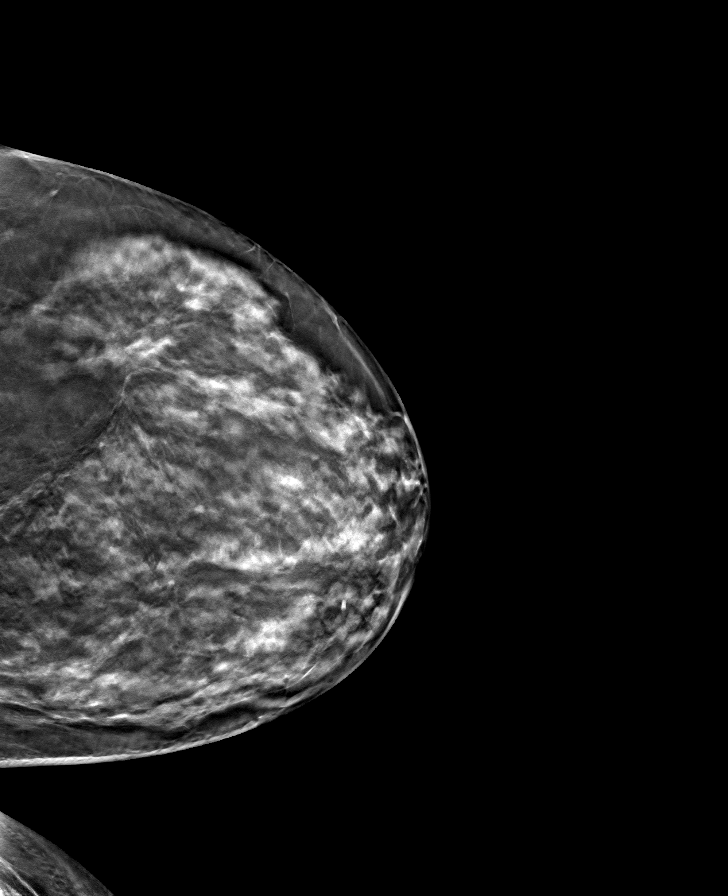

[R CC tomo · tomo slice 41/82.0]
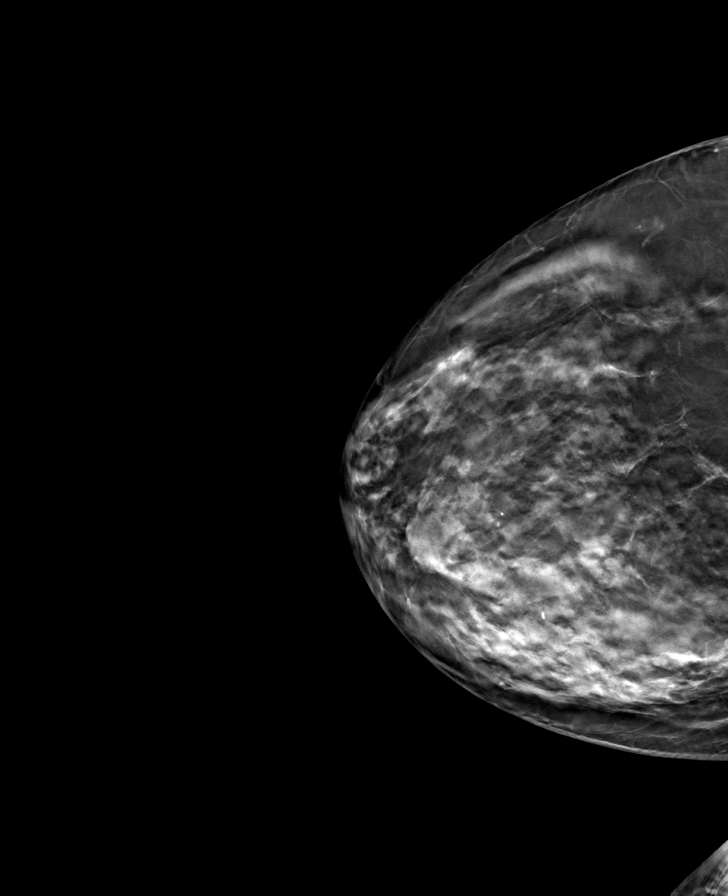

[8 of 24 positions shown; findings below may reference images not displayed]

ACR Breast Density Category c: The breast tissue is heterogeneously
dense, which may obscure small masses.
FINDINGS: There are no findings suspicious for malignancy.
IMPRESSION: No mammographic evidence of malignancy. A result letter of this
screening mammogram will be mailed directly to the patient.

RECOMMENDATION:
Screening mammogram in one year. (Code:Q3-W-BC3)

BI-RADS CATEGORY  1: Negative.

## 2024-04-12 ENCOUNTER — Other Ambulatory Visit: Payer: Self-pay | Admitting: Physician Assistant

## 2024-04-14 ENCOUNTER — Ambulatory Visit: Admitting: Obstetrics and Gynecology

## 2024-04-15 NOTE — Telephone Encounter (Signed)
 Please call our office to schedule an overdue appointment with Cardiologist before anymore refills. [phone]. Thank you 2nd attempt

## 2024-05-10 ENCOUNTER — Ambulatory Visit: Admitting: Internal Medicine

## 2024-05-10 ENCOUNTER — Ambulatory Visit: Admitting: Obstetrics and Gynecology

## 2024-06-27 ENCOUNTER — Ambulatory Visit: Admitting: Internal Medicine

## 2024-07-01 ENCOUNTER — Ambulatory Visit

## 2024-08-08 ENCOUNTER — Ambulatory Visit
